# Patient Record
Sex: Male | Born: 1940 | Race: White | Hispanic: No | Marital: Married | Smoking: Former smoker
Health system: Southern US, Community
[De-identification: ages and names within clinical notes are randomized; demographics above are authoritative.]

## PROBLEM LIST (undated history)

## (undated) DIAGNOSIS — A419 Sepsis, unspecified organism: Secondary | ICD-10-CM

## (undated) DIAGNOSIS — E782 Mixed hyperlipidemia: Secondary | ICD-10-CM

## (undated) DIAGNOSIS — M722 Plantar fascial fibromatosis: Secondary | ICD-10-CM

## (undated) DIAGNOSIS — F329 Major depressive disorder, single episode, unspecified: Secondary | ICD-10-CM

## (undated) DIAGNOSIS — J189 Pneumonia, unspecified organism: Secondary | ICD-10-CM

## (undated) DIAGNOSIS — I749 Embolism and thrombosis of unspecified artery: Secondary | ICD-10-CM

## (undated) DIAGNOSIS — E46 Unspecified protein-calorie malnutrition: Secondary | ICD-10-CM

## (undated) DIAGNOSIS — M109 Gout, unspecified: Secondary | ICD-10-CM

## (undated) DIAGNOSIS — I10 Essential (primary) hypertension: Secondary | ICD-10-CM

## (undated) DIAGNOSIS — M1990 Unspecified osteoarthritis, unspecified site: Secondary | ICD-10-CM

## (undated) DIAGNOSIS — F32A Depression, unspecified: Secondary | ICD-10-CM

## (undated) DIAGNOSIS — N400 Enlarged prostate without lower urinary tract symptoms: Secondary | ICD-10-CM

## (undated) DIAGNOSIS — R739 Hyperglycemia, unspecified: Secondary | ICD-10-CM

## (undated) DIAGNOSIS — G20 Parkinson's disease: Secondary | ICD-10-CM

## (undated) DIAGNOSIS — E785 Hyperlipidemia, unspecified: Secondary | ICD-10-CM

## (undated) DIAGNOSIS — I2510 Atherosclerotic heart disease of native coronary artery without angina pectoris: Secondary | ICD-10-CM

## (undated) DIAGNOSIS — I671 Cerebral aneurysm, nonruptured: Secondary | ICD-10-CM

## (undated) DIAGNOSIS — L89159 Pressure ulcer of sacral region, unspecified stage: Secondary | ICD-10-CM

## (undated) DIAGNOSIS — I491 Atrial premature depolarization: Secondary | ICD-10-CM

## (undated) DIAGNOSIS — K921 Melena: Secondary | ICD-10-CM

## (undated) DIAGNOSIS — I609 Nontraumatic subarachnoid hemorrhage, unspecified: Secondary | ICD-10-CM

## (undated) DIAGNOSIS — J329 Chronic sinusitis, unspecified: Secondary | ICD-10-CM

## (undated) DIAGNOSIS — Z930 Tracheostomy status: Secondary | ICD-10-CM

## (undated) DIAGNOSIS — I639 Cerebral infarction, unspecified: Secondary | ICD-10-CM

## (undated) HISTORY — DX: Plantar fascial fibromatosis: M72.2

## (undated) HISTORY — DX: Cerebral infarction, unspecified: I63.9

## (undated) HISTORY — PX: OTHER SURGICAL HISTORY: SHX169

## (undated) HISTORY — DX: Melena: K92.1

## (undated) HISTORY — DX: Nontraumatic subarachnoid hemorrhage, unspecified: I60.9

## (undated) HISTORY — DX: Mixed hyperlipidemia: E78.2

## (undated) HISTORY — DX: Gout, unspecified: M10.9

## (undated) HISTORY — DX: Unspecified osteoarthritis, unspecified site: M19.90

## (undated) HISTORY — DX: Atrial premature depolarization: I49.1

## (undated) HISTORY — DX: Chronic sinusitis, unspecified: J32.9

## (undated) HISTORY — DX: Atherosclerotic heart disease of native coronary artery without angina pectoris: I25.10

## (undated) HISTORY — DX: Hyperglycemia, unspecified: R73.9

## (undated) HISTORY — DX: Essential (primary) hypertension: I10

## (undated) HISTORY — PX: APPENDECTOMY: SHX54

## (undated) HISTORY — DX: Embolism and thrombosis of unspecified artery: I74.9

## (undated) HISTORY — DX: Cerebral aneurysm, nonruptured: I67.1

---

## 1999-05-02 ENCOUNTER — Encounter: Payer: Self-pay | Admitting: Obstetrics and Gynecology

## 1999-05-03 ENCOUNTER — Inpatient Hospital Stay: Admission: RE | Admit: 1999-05-03 | Discharge: 1999-05-09 | Payer: Self-pay | Admitting: Vascular Surgery

## 1999-05-03 ENCOUNTER — Encounter (INDEPENDENT_AMBULATORY_CARE_PROVIDER_SITE_OTHER): Payer: Self-pay

## 1999-05-03 ENCOUNTER — Encounter: Payer: Self-pay | Admitting: Vascular Surgery

## 2001-05-20 ENCOUNTER — Ambulatory Visit (HOSPITAL_COMMUNITY): Admission: RE | Admit: 2001-05-20 | Discharge: 2001-05-20 | Payer: Self-pay | Admitting: Internal Medicine

## 2004-10-19 ENCOUNTER — Ambulatory Visit (HOSPITAL_COMMUNITY): Admission: RE | Admit: 2004-10-19 | Discharge: 2004-10-19 | Payer: Self-pay | Admitting: *Deleted

## 2004-10-19 ENCOUNTER — Ambulatory Visit (HOSPITAL_BASED_OUTPATIENT_CLINIC_OR_DEPARTMENT_OTHER): Admission: RE | Admit: 2004-10-19 | Discharge: 2004-10-19 | Payer: Self-pay | Admitting: *Deleted

## 2005-10-18 ENCOUNTER — Ambulatory Visit (HOSPITAL_BASED_OUTPATIENT_CLINIC_OR_DEPARTMENT_OTHER): Admission: RE | Admit: 2005-10-18 | Discharge: 2005-10-18 | Payer: Self-pay | Admitting: *Deleted

## 2006-07-22 ENCOUNTER — Ambulatory Visit: Payer: Self-pay | Admitting: Cardiology

## 2006-07-25 ENCOUNTER — Ambulatory Visit: Payer: Self-pay | Admitting: Cardiology

## 2006-07-30 ENCOUNTER — Ambulatory Visit: Payer: Self-pay | Admitting: Cardiology

## 2006-08-16 ENCOUNTER — Ambulatory Visit: Payer: Self-pay | Admitting: Cardiology

## 2006-08-24 ENCOUNTER — Ambulatory Visit: Payer: Self-pay | Admitting: Cardiology

## 2006-09-18 ENCOUNTER — Ambulatory Visit: Payer: Self-pay | Admitting: Cardiology

## 2006-12-17 ENCOUNTER — Ambulatory Visit: Payer: Self-pay | Admitting: Cardiology

## 2007-02-18 ENCOUNTER — Ambulatory Visit: Payer: Self-pay | Admitting: Cardiology

## 2007-02-20 ENCOUNTER — Inpatient Hospital Stay (HOSPITAL_BASED_OUTPATIENT_CLINIC_OR_DEPARTMENT_OTHER): Admission: RE | Admit: 2007-02-20 | Discharge: 2007-02-20 | Payer: Self-pay | Admitting: Cardiology

## 2007-02-20 ENCOUNTER — Ambulatory Visit: Payer: Self-pay | Admitting: Cardiology

## 2007-02-27 ENCOUNTER — Ambulatory Visit: Payer: Self-pay | Admitting: Cardiology

## 2007-10-01 ENCOUNTER — Ambulatory Visit: Payer: Self-pay | Admitting: Cardiology

## 2008-08-20 ENCOUNTER — Ambulatory Visit: Payer: Self-pay | Admitting: Cardiology

## 2009-03-25 DIAGNOSIS — I2510 Atherosclerotic heart disease of native coronary artery without angina pectoris: Secondary | ICD-10-CM

## 2009-03-25 DIAGNOSIS — R002 Palpitations: Secondary | ICD-10-CM

## 2009-03-25 DIAGNOSIS — I10 Essential (primary) hypertension: Secondary | ICD-10-CM

## 2009-03-25 DIAGNOSIS — E785 Hyperlipidemia, unspecified: Secondary | ICD-10-CM

## 2010-11-29 NOTE — Assessment & Plan Note (Signed)
[facility]                          [facility]   Richard Mcpherson, Richard Mcpherson                       MRN:          [id number]  DATE:08/20/2008                            DOB:          [birth year]    REFERRING PHYSICIAN:  Kirstie Peri, MD   The patient is a 70 year old male with history of nonobstructive  coronary artery disease by catheterization in August 2008.  He has also  history of palpitations.  He states that he is doing better from that  perspective.  He has no shortness of breath, orthopnea, PND.  He has no  more palpitations.  Next, he is essentially asymptomatic from  cardiovascular standpoint.   MEDICATIONS:  1. Aspirin 81 mg a day.  2. Tenoretic 50 mg p.o. daily.  3. Vytorin 10/80 mg p.o. nightly.   PHYSICAL EXAMINATION:  VITAL SIGNS:  Blood pressure is 123/73, heart  rate 65, weight 227.  HEENT:  Pupils, eyes clear; conjunctivae clear.  NECK:  Supple.  Normal carotid upstroke.  No carotid bruits.  LUNGS:  Clear breath sounds bilaterally.  HEART:  Regular rate and rhythm with normal S1, S2.  No murmurs, rubs,  or gallops.  ABDOMEN:  Soft, nontender.  No rebound or guarding.  Good bowel sounds.  EXTREMITY:  No cyanosis, clubbing, or edema.   PROBLEM LIST:  1. Nonobstructive coronary artery disease.  2. Good left ventricular function.  3. Palpitations quiescent on beta-blocker therapy.  4. Hypertension, controlled.  5. Dyslipidemia.  6. History of right lower extremity distal embolism status post      embolectomy 2000.  7. History of hyperglycemia.   PLAN:  1. The patient is doing well.  Dr. Sherryll Burger is following his cholesterol.  2. His blood pressure is under good control.  3. The patient with no cardiovascular symptoms and does not need any      stress testing at this point in time.     Learta Codding, MD,FACC  Electronically Signed    GED/MedQ  DD: 08/20/2008  DT: 08/21/2008  Job #: [id number]   cc:   Kirstie Peri,  MD

## 2010-11-29 NOTE — Assessment & Plan Note (Signed)
[facility]                          [facility]   ELIZEO, RODRIQUES                       MRN:          [id number]  DATE:02/27/2007                            DOB:          05-06-1941    CARDIOLOGIST:  Dr. Lewayne Bunting.   PRIMARY CARE PHYSICIAN:  Kirstie Peri, M.D.   HISTORY OF PRESENT ILLNESS:  Mr. Richard Mcpherson is a 70 year old male patient  who had recently been seen in the office with palpitations.  Since being  seen for palpitations, he presented to [facility] with complaints  of chest pain.  He had been evaluated several times in the past with  stress perfusion studies and Dr. Andee Lineman decided he should go for cardiac  catheterization to further define this anatomy.  That was performed on  February 20, 2007 by Dr. Riley Kill.  This revealed nonobstructive coronary  disease.  Specifically, he had a 30-40% mid LAD stenosis and a 60-70%  stenosis in the mid to distal vessel that looked to be intramyocardial.  His proximal diagonal had a 20-30% mid lesion and the marginal branch  had a 30-40% proximal to mid lesion.  The RCA was normal.  EF appeared  to be well-preserved.  He returns today for followup.   He denies any recurrent chest pain or shortness of breath, syncope or  near-syncope.  He does have occasional dyspepsia.  This is controlled  with over-the-counter antacids.   CURRENT MEDICATIONS:  1. Aspirin 81 mg daily.  2. Atenolol/chlorthalidone 50/25 mg daily.  3. Isosorbide mononitrate 30 mg daily.  4. Pravachol 40 mg 2 tablets nightly.  5. Multivitamin daily.   ALLERGIES:  CODEINE.   PHYSICAL EXAM:  He is well-developed, well-nourished in no acute  distress.  Blood pressure 132/72, pulse 59.  HEENT:  Normal.  NECK:  Without JVD.  CARDIAC:  S1, S2.  Regular rate and rhythm without murmurs.  LUNGS:  Clear to auscultation bilaterally without wheeze, rales, or  rhonchi.  ABDOMEN:  Soft and nontender with normoactive bowel  sounds.  No  organomegaly.  EXTREMITIES:  Without edema.  Calves soft and nontender.  SKIN:  Warm and dry.  NEUROLOGIC:  He is alert and oriented x3.  Cranial nerves 2-12 are  grossly intact.  Right femoral arteriotomy site without hematoma or bruit.   IMPRESSION:  1. Nonobstructive coronary artery disease as outlined above.  2. Good left ventricular function.  3. History of palpitations evaluated in the past by CardioNet event      monitoring.  4. Hypertension, treated.  5. Treated dyslipidemia.  6. History of hyperglycemia in the past.  7. History of acute right lower extremity distal embolus status post      embolectomy in 2000.  8. Past history of gastroesophageal reflux disease.   PLAN:  The patient presents to the office today for followup post  catheterization.  He is doing well.  As noted above, he has  nonobstructive coronary artery disease.  He needs aggressive risk factor  modification.  He last had his lipids checked in mid to late July, and  we adjusted  his Pravachol.  I had a long discussion with him today  regarding Vytorin versus other Statins.  He may require this in the  future if his LDL does not get down to 70 or lower.  He will continue on  aspirin, atenolol, or isosorbide.  He will follow up with his primary  care physician for his hyperglycemia and see [place] back in 6 months.      [name], PA-C  Electronically Signed      Jonelle Sidle, MD  Electronically Signed   SW/MedQ  DD: 02/27/2007  DT: 02/28/2007  Job #: [id number]   cc:   Kirstie Peri, MD

## 2010-11-29 NOTE — Cardiovascular Report (Signed)
NAME:  Richard Mcpherson, Richard Mcpherson NO.:  [id number]   MEDICAL RECORD NO.:  [id number]          PATIENT TYPE:  OIB   LOCATION:  1962                         FACILITY:  [facility]   PHYSICIAN:  Arturo Morton. Riley Kill, MD, FACCDATE OF BIRTH:  1940/12/29   DATE OF PROCEDURE:  02/20/2007  DATE OF DISCHARGE:                            CARDIAC CATHETERIZATION   INDICATIONS:  Mr. Dever is a 70 year old gentleman followed by Dr.  Andee Lineman.  He has had several admissions for some chest discomfort.  He  has had a low-risk radionuclide imaging study.  He has a strong family  history.  He has had prior embolus removed from the right lower  extremity for which he was treated with Coumadin for 6 months.  He  stopped smoking at that time.  Current study is done to assess coronary  anatomy.   PROCEDURE:  1. Left heart catheterization  2. Selective coronary arteriography.  3..  Hand injection into the left ventricle.   DESCRIPTION OF PROCEDURE:  The patient was brought to the  catheterization laboratory, prepped and draped in usual fashion.  A  repeat creatinine the morning of the study was 0.8.  It had been  slightly higher at 1.5 earlier.  Of note is also a mild eosinophilia.  The procedure was performed from the right femoral artery using 4-French  catheters.  We limited the LV injection to a hand injection to reduce  contrast load.  Views of the left and right coronary arteries were  obtained.  All catheters were subsequently removed.  He was taken to the  holding area in satisfactory clinical condition.  I then discussed the  case with Dr. Andee Lineman and reviewed the films with the patient's spouse.  There were no major complications.   HEMODYNAMIC DATA.:  1. Central aortic pressure 113/62.  2. LV pressure 110/13.  3. No gradient at pullback across the aortic valve.   ANGIOGRAPHIC DATA.:  1. Ventriculography was done by hand injection only.  Overall systolic      function appeared to be  reasonably good, although an accurate      assessment cannot be completely obtained with just a hand injection      alone.  2. The left main is free of critical disease.  3. The LAD courses to the apex.  After the diagonal takeoff there is a      focal 30-40% area of narrowing.  In the mid distal vessel is a long      area of segmental plaque that looks like it may be intramyocardial.      There is probably about 60 to up to 70% narrowing in the mid to      distal vessel, although it does appear to be likely adequate for      flow.  The vessel then goes down over the apical tip.  4. There is a proximal diagonal or intermediate vessel that has about      20-30% narrowing in its midportion but is otherwise without      critical disease.  5. The circumflex is a dominant vessel.  There is a major marginal      branch that has eccentric 30-40% proximal mid plaquing.  The      remainder of the vessel is without critical narrowing.  The AV      circumflex courses posteriorly into a posterolateral system and      posterior descending artery.  These appear free of critical      disease.  6. The right coronary is nondominant vessel without critical      narrowing.   CONCLUSIONS:  1. Preserved overall left ventricular function by hand injection.  2. 30-40% mid-LAD focal stenosis and long segmental 60-70% area of      disease in an intramyocardial segment distally.  3. Scattered lesions as noted above.  4. Mild increase in creatinine on diuretic therapy, now improved.  5. Mild eosinophilia by CBC testing.   PLAN:  1. Follow-up with Dr. Andee Lineman for adjustment of blood pressure      medicines.  2. Continued follow-up with Dr. Sherryll Burger.  3. Aggressive risk factor reduction.      Arturo Morton. Riley Kill, MD, [facility]  Electronically Signed     TDS/MEDQ  D:  02/20/2007  T:  02/20/2007  Job:  [id number]   cc:   Learta Codding, MD,FACC  Kirstie Peri, MD

## 2010-11-29 NOTE — Assessment & Plan Note (Signed)
[facility]                          [facility]   Richard Mcpherson, Richard Mcpherson                       MRN:          [id number]  DATE:10/01/2007                            DOB:          Mar 16, 1941    CARDIOLOGIST:  Dr. Lewayne Bunting.   PRIMARY CARE PHYSICIAN:  Dr. Kirstie Peri.   REASON FOR VISIT:  A 58-month followup.   HISTORY OF PRESENT ILLNESS:  Richard Mcpherson is a 70 year old male patient  with a history of nonobstructive coronary disease by catheterization in  August 2008 who returns to the office today for followup.  Overall he is  doing well.  Denies any chest pain, shortness breath.  Denies any  orthopnea, PND, or pedal edema.  Denies any palpitations or syncope.  The patient does tell me he has had a rash on his face for the last  several months.  He was concerned that it was the  atenolol/chlorthalidone.  He stopped this over the last 10 days, and his  rash seems to be getting better.   CURRENT MEDICATIONS:  1. Aspirin 81 mg daily.  2. Vytorin 10/40 mg nightly.   ALLERGIES:  CODEINE.   PHYSICAL EXAM:  He is well-nourished male in no acute distress.  Blood pressure is 140/70, pulse 62, weight 228.2 pounds.  HEENT is normal.  Neck without appreciable JVD.  CARDIAC:  Normal S1, S2.  Regular rate and rhythm without murmur.  LUNGS:  Clear to auscultation bilaterally.  ABDOMEN:  Soft, nontender.  EXTREMITIES:  No edema.  NEUROLOGIC:  He is alert and oriented x3.  Cranial II-XII grossly  intact.  SKIN:  He does have a diffuse erythematous rash about his face,  especially in the beard area.   Electrocardiogram reveals sinus rhythm with heart rate of 62 normal  axis, no acute changes.     Database: Labs obtained June 2009 revealing a creatinine of 0.9,  triglycerides 236, total cholesterol 193, HDL 27, LDL 119.   IMPRESSION:  1. Rash.      a.     Question secondary to thiazide diuretic  2. Nonobstructive coronary disease.  3.  Good LV function.  4. History of palpitations.      a.     Quiescent on beta-blocker therapy.      b.     Documented accelerated idioventricular rhythm by CardioNet       monitoring in 2008.  5. hypertension.  6. Dyslipidemia.  7. History of acute right lower extremity distal embolus, status post      embolectomy in 2000.  8. History of hyperglycemia.   PLAN:  1. Discontinue the atenolol/chlorthalidone.  Question whether or not      he may have rash coming from the thiazide component.  2. Initiate atenolol 50 mg daily.  He notes that he has had      symptomatic improvement with the atenolol.  He has had much less      palpitations.  He is to start the atenolol once his rash has      subsided or almost subsided.  3. He will  need followup lipids and LFTs some time in the next several      weeks.  If his LDL remains above 70, we will increase his Vytorin      to 10/80 mg daily.  4. We will see him back in 6 months or sooner p.r.n.      [name], PA-C  Electronically Signed      Learta Codding, MD,FACC  Electronically Signed   SW/MedQ  DD: 10/01/2007  DT: 10/01/2007  Job #: [id number]   cc:   Kirstie Peri, MD

## 2010-12-02 NOTE — Op Note (Signed)
NAME:  Richard Mcpherson, Richard Mcpherson NO.:  [id number]   MEDICAL RECORD NO.:  [id number]          PATIENT TYPE:  AMB   LOCATION:  [facility]SC                          FACILITY:  [facility]   PHYSICIAN:  Tennis Must Meyerdierks, M.D.DATE OF BIRTH:  [birth year]   DATE OF PROCEDURE:  10/19/2004  DATE OF DISCHARGE:                                 OPERATIVE REPORT   PREOPERATIVE DIAGNOSES:  Dupuytren's contracture right middle, ring, and  small fingers.   POSTOPERATIVE DIAGNOSES:  Dupuytren's contracture right middle, ring, and  small fingers.   PROCEDURE:  Excision of Dupuytren's cords with release of contracture right  middle, ring and small fingers.   SURGEON:  Lowell Bouton, M.D.   ANESTHESIA:  General.   OPERATIVE FINDINGS:  The patient had longitudinal cords from the mid palm  out to the DIP joint on the ring and small fingers with a cord mainly  involving the MP joint on the long finger. The radial digital nerve and  artery wrapped around the cord in both the ring and small fingers.   DESCRIPTION OF PROCEDURE:  Under general anesthesia with a tourniquet on the  right arm, the right hand was prepped and draped in usual fashion. After  exsanguinating the limb, the tourniquet was inflated to 250 mmHg. A  transverse incision was made in the distal palmar crease from the web  between the second and third fingers across to the ulnar side of the hand.  Sharp dissection was carried through the Dupuytren's' cords partially  releasing the digit. Blunt dissection was then carried around the cords in  the palm to identify the neurovascular bundles on either side. Incision was  then extended on the small finger first in a Brunner zigzag fashion out to  the DIP joint. Sharp dissection was carried through the subcutaneous tissues  and 4-0 silk retention sutures were inserted for retraction. The dissection  was carried from proximal to distal carefully dissecting out the radial and  ulnar digital nerves of the small finger. The contracture was then  completely excised from proximal to distal and was found to go around the  radial digital nerve at the proximal digital crease. After carefully  dissecting it out, the neurovascular bundles were left intact. The finger  came out to neutral position after excising all of the Dupuytren's' cord. A  zigzag incision was then made in line with the ring finger just beyond the  DIP crease. Flaps were elevated sharply and 4-0 silk retention sutures were  inserted. Blunt dissection was carried from proximal to distal along the  Dupuytren's' cord and a retrovascular cord was identified radially. It was  traced all the way to the DIP joint and the neurovascular bundles were  protected. After completely excising the cord on the ring finger, the finger  was completely straight. On the long finger, blunt dissection was carried in  the palm out to the proximal digital crease and the cord was released from  the MP joint. Care was taken to protect the ulnar neurovascular bundle. The  MP joint went out to full extension. Following excision  of this cord. The  wounds were then irrigated copiously with saline and vessel loop drains were  inserted in the ring and small fingers. The skin was closed with 4-0 nylon  sutures along each finger. The palm was left open using the __________ open  palm technique. Sterile dressings were applied. Marcaine 0.5%  was  inserted into the skin for pain control and digital blocks were performed.  Sterile dressings were applied and a plaster splint was placed with the  fingers in full extension and the wrist in extension. The tourniquet was  released with good circulation of the hand. The patient went to recovery  room awake and stable condition.      EMM/MEDQ  D:  10/19/2004  T:  10/19/2004  Job:  [id number]   cc:   Dr. Lorin Picket __________

## 2010-12-02 NOTE — Assessment & Plan Note (Signed)
[facility]                          [facility]   JOSEL, KEO                       MRN:          [id number]  DATE:08/16/2006                            DOB:          05-22-1941    REASON FOR VISIT:  Post hospital followup.   Mr. Doane is a very pleasant 70 year old male, whom we have seen on 2  separate occasions in the last few weeks here at [facility], in  consultation for evaluation of chest pain.  The patient ruled out for  myocardial infarction with negative serial cardiac markers and had had  an adequate exercise stress Cardiolite prior to our initial evaluation,  which we felt to be a low risk study.  Left ventricular function  was  normal.   Of note, the patient does have several cardiac risk factors, and also  had documented history of acute distal embolus to the right lower  extremity requiring embolectomy by Dr. Cari Caraway in 2000.  He was  treated with Coumadin for 6 months.   The patient also presented with dyslipidemia with a significantly low  HDL.  We recommended the addition of Niaspan for this, and the patient  has been tolerating this medication.   The patient also presented to [place] with a longstanding history of  palpitations.  We recommended a followup outpatient cardiac monitor and  the patient has one remaining week.  He continues to have a sensation of  skipped beats, but no fluttering, as he had reported to [place] during his  hospitalization.  Of note, TSH level was normal.   Review of rhythm strips to date do reveal a run of AIVR to approximately  100 BPM, with duration of 14 beats.   MEDICATIONS:  1. Aspirin 81 daily.  2. Niaspan 500 daily.   PHYSICAL EXAMINATION:  Blood pressure 152/90, pulse 84, regular, weight  224.  GENERAL:  A 70 year old male sitting upright in no distress.  HEENT:  Normocephalic and atraumatic.  NECK:  Palpable carotid pulses, without bruits.  LUNGS:  Clear  to auscultation in all fields.  HEART:  Regular rate and rhythm (S1, S2).  No significant murmurs.  ABDOMEN:  Soft, nontender.  EXTREMITIES:  Intact pulses, without edema.  NEURO:  Nonfocal, no deficit.   IMPRESSION:  1. Longstanding palpitations.      a.     Asymptomatic.      b.     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      c.     Preserved left ventricular function  by a recent stress       test.  2. Atypical chest pain.      a.     Low risk, adequate exercise stress Cardiolite; EF 66%       January 2008.  3. Dyslipidemia/low HDL.      a.     Niaspan recently added.  4. Hypertension.  5. Status post right lower extremity embolectomy.      a.     Secondary to acute distal embolus in 2000, per Dr. Thayer Ohm  Edilia Bo.      b.     Treated with Coumadin for 6 months.  6. GERD.   PLAN:  1. Schedule 2D echocardiogram for reassessment of left ventricular      function and exclusion of underlying structural abnormalities.  2. Continue medical therapy for the asymptomatic palpitations.  We      will, however, add a low dose beta blocker, in conjunction with a      diuretic, for treatment of both the documented PVCs as well as the      hypertension.  3. Return to clinic, follow with myself and Dr. Andee Lineman in one month      for review of final CardioNet results, as well as followup of the      2D echocardiogram study.  4. The patient will need followup fasting lipids/liver profile in      approximately 2 months.      Gene Serpe, PA-C  Electronically Signed      Learta Codding, MD,FACC  Electronically Signed   GS/MedQ  DD: 08/16/2006  DT: 08/16/2006  Job #: [id number]   cc:   Kirstie Peri, MD

## 2010-12-02 NOTE — Assessment & Plan Note (Signed)
[facility]                          [facility]   Richard Mcpherson, Richard Mcpherson                       MRN:          [id number]  DATE:11/16/2006                            DOB:          1940/10/05    CARDIOLOGIST:  Dr. Andee Lineman.   PRIMARY CARE PHYSICIAN:  Dr. Sherryll Burger.   HISTORY OF PRESENT ILLNESS:  Richard Mcpherson is a 70 year old male patient  with history of hypertension and palpitations who returns to the office  today for followup.  He saw Richard Serpe, PA-C last on September 18, 2006.  At  that time he had recommended that the patient decrease his  atenolol/chlorthalidone to three times a week to help with side effects.  The patient decided to keep taking it every day, and his fatigue and  sluggishness have abated.  He notes that his palpitations are quite  minimal now.  He notices them every once in awhile, and they only last a  few seconds.  He denies syncope or near syncope.  Denies any chest pain,  shortness of breath.  Denies any nausea, diaphoresis.   CURRENT MEDICATIONS:  1. Aspirin 81 mg daily.  2. Atenolol/chlorthalidone 50/25 mg daily.  3. He recently stopped Niaspan because he was developing rash.   ALLERGIES:  CODEINE.   PHYSICAL EXAMINATION:  GENERAL:  He is a well-nourished, well-developed  male.  VITAL SIGNS:  Blood pressure 125/81, pulse 57, weight 216.4 pounds.  HEENT:  Normal.  NECK:  Without JVD.  Carotids are without bruits bilaterally.  CARDIAC:  S1, S2, regular rate and rhythm without murmur.  LUNGS:  Clear to auscultation bilaterally without wheezing, rhonchi or  rales.  ABDOMEN:  Soft, nontender with normoactive bowel sounds.  No  organomegaly.  EXTREMITIES:  Without edema.  Calves soft, nontender.  SKIN:  Warm and dry.  NEUROLOGIC:  He is alert and oriented x3.  Cranial nerves II-XII are  grossly intact.   IMPRESSION:  1. Palpitations.      a.     Improved on beta blocker.      b.     Documented accelerated  idioventricular rhythm by recent       CardioNet monitoring.  2. Normal LV function.  3. Hypertension.  4. History of low risk adequate exercise Cardiolite January 2008.  5. Mixed dyslipidemia.      a.     Recent lipid panel October 24, 2006:  Triglycerides 292, total       cholesterol 255, HDL 23, LDL 174.  6. Hyperglycemia.      a.     Recent metabolic panel October 24, 2006 with glucose 106.  7. History of acute right lower extremity distal embolus status post      embolectomy in 2000.   PLAN:  The patient returns to the office today for followup.  He was  able to continue on his beta blocker on a daily basis, and this has  improved his symptoms.  He is not bothered by his palpitations all that  significantly.  Will continue his current therapy as listed above.  He  does,  however, have significant dyslipidemia.  I think given his risk  factors and probable glucose intolerance, we should proceed with statin  therapy.  He is now off Niaspan.  He had had some side effects to  Lipitor in the past.  He stopped Vytorin secondary to reports in the  media recently.  I have recommended we place him on Pravachol 40 mg  q.h.s., and he is in agreement with this.  Will check lipids and LFTs in  about 6-8 weeks.  I asked him to follow up with Dr. Sherryll Burger about his  mildly elevated blood glucose and also recommended that he refer to the  American Heart Association web site and/or the [employer] for help  with his diet.  He will continue with walking for exercise.  Will have  him follow up routinely here in about 6-9 months with Dr. Andee Lineman.      [name], PA-C  Electronically Signed      Learta Codding, MD,FACC  Electronically Signed   SW/MedQ  DD: 12/17/2006  DT: 12/17/2006  Job #: [id number]   cc:   Richard Peri, MD

## 2010-12-02 NOTE — Op Note (Signed)
NAME:  Richard Mcpherson, Richard Mcpherson NO.:  [id number]   MEDICAL RECORD NO.:  [id number]          PATIENT TYPE:  AMB   LOCATION:  DSC                          FACILITY:  [facility]   PHYSICIAN:  Lowell Bouton, M.D.DATE OF BIRTH:  07/17/1941   DATE OF PROCEDURE:  10/18/2005  DATE OF DISCHARGE:                                 OPERATIVE REPORT   PREOP DIAGNOSIS:  Dupuytren's contracture, left long and ring fingers.   POSTOP DIAGNOSIS:  Dupuytren's contracture, left long and ring fingers.   PROCEDURE:  Excision of Dupuytren's contracture, left long and ring fingers.   SURGEON:  Lowell Bouton, M.D.   ANESTHESIA:  General.   OPERATIVE FINDINGS:  The patient had a large central cord on the long finger  that extended from the proximal palm out to beyond the PIP joint.  Both the  PIP and MP were involved.  The ring finger had a smaller cord that mainly  involved the MP joint.   PROCEDURE:  Under general anesthesia with a tourniquet on the left arm, the  left hand was prepped and draped in usual fashion and after exsanguinating  the limb, the tourniquet was inflated to 250 mmHg.  A transverse incision  was made in the palm over the large cord in line with the long finger.  The  cord was then divided.  This area was intended to be left opened using the  McCash technique.  A Brunner incision was made then from the transverse  incision proximally and distally out to the DIP level on the long finger.  The proximal end was done first and a large cord was removed from the  proximal palm.  The dissection was then carried distally and the  neurovascular bundles were identified in the palm.  They were traced out to  the proximal crease and the cord was elevated from the flexor sheath  sharply.  The flaps were then elevated and 4-0 silk retention sutures were  inserted.  Blunt dissection was carried distally out at the middle phalanx,  identifying the neurovascular bundle  both radially and ulnarly.  These  bundles were then traced distal to proximal, excising any Dupuytren's cords  as it went.  There were oblique cords out at the level of the middle phalanx  that were both excised.  The central cord was then completely excised after  protecting the neurovascular bundles both radially and ulnarly.  The finger  came out to full extension.  The specimen was not sent to the lab.  A zigzag  incision was then made in the ring finger over the proximal digital crease  and carried down sharply through the subcutaneous tissues.  There was a  dimple there that was excised and then a Dupuytren's cord that measured  about an inch in length.  This was completely excised, taking care to  protect the neurovascular bundles.  The fingers were then completely  straight.  The wounds were irrigated copiously with saline.  The skin was  closed with 4-0 nylon sutures over a vessel loop drain leaving the palm  open.  Sterile dressings  were then applied followed by a volar splint.  Tourniquet was released with  good circulation of the hand.  Half percent Marcaine was inserted for pain  control.  He went to the recovery room awake and stable condition.  Volar  splint was applied.      Lowell Bouton, M.D.  Electronically Signed     EMM/MEDQ  D:  10/18/2005  T:  10/19/2005  Job:  [id number]

## 2010-12-02 NOTE — Assessment & Plan Note (Signed)
[facility]                          [facility] OFFICE NOTE   Richard Mcpherson, Richard Mcpherson                       MRN:          [id number]  DATE:09/18/2006                            DOB:          1941-03-16    PRIMARY CARDIOLOGIST:  Learta Codding, MD,FACC   REASON FOR VISIT:  Schedule clinic followup. Please refer to my clinic  note of January 31 for full details.   Since our last visit, the patient has had a 2D echocardiogram revealing  normal left ventricular function with only very mild LVH.   He has also completed evaluation with the CardioNet monitor which  yielded a run of nonsustained VT with otherwise NSR and frequent PACs.   We reviewed these findings at time of our last clinic visit and  recommended adding a low dose beta-blocker, in conjunction with the  diuretic, for treatment of the documented PVCs as well as hypertension.   Since then, the patient reports a marked decrease in palpitations, but  also feels a bit more fatigued and lethargic since the addition of this  medication.   CURRENT MEDICATIONS:  1. Aspirin 81 daily.  2. Atenolol/chlorthalidone 50/25 daily.  3. Niaspan 500 daily.   PHYSICAL EXAMINATION:  Blood pressure 126/76, pulse 58 and regular,  weight 224.  GENERAL: A 70 year old male, sitting upright in no distress.  HEENT: Normocephalic, atraumatic.  NECK: Palpable carotid pulses.  LUNGS:  Clear to auscultation in all fields.  HEART: Regular rate and rhythm (S1, S2). No significant murmurs.  ABDOMEN: Soft, nontender.  EXTREMITIES: No edema.  NEURO: No focal deficit.   IMPRESSION:  1. Longstanding history of palpitations.      a.     Marked improvement with recent addition of beta-blocker.      b.     Documented AIVR by recent CardioNet monitoring.  2. Normal left ventricular function.  3. Hypertension.      a.     Much improved with recent addition of diuretic.  4. History of atypical chest pain.      a.      Low risk, adequate exercise Cardiolite in January 2008.  5. Dyslipidemia/low HDL.      a.     Recent addition of Niaspan.  6. History of acute right lower extremity distal embolus.      a.     Status post embolectomy in 2000: Treated with Coumadin for       six months.   PLAN:  1. We agreed to down titrate the atenolol/chlorthalidone to three      times a week to try to minimize the impact on blood pressure as      well as heart rate and see if this improves his symptoms of feeling      fatigued and sluggish.  2. Check a fasting lipid/liver profile in one month, following recent      addition of Niaspan for treatment of low HDL.  3. Return clinic followup with myself and Dr. Andee Lineman in three months.      Gene Serpe, PA-C  Electronically Signed  Learta Codding, MD,FACC  Electronically Signed   GS/MedQ  DD: 09/18/2006  DT: 09/18/2006  Job #: [id number]   cc:   Kirstie Peri, MD

## 2010-12-02 NOTE — Assessment & Plan Note (Signed)
[facility]                                 ON-CALL NOTE   Richard Mcpherson, Richard Mcpherson                       MRN:          [id number]  DATE:08/10/2006                            DOB:          Nov 28, 1940    I got a call from the CardioNet monitoring services tonight regarding  [name]. Apparently, he is a patient of Dr. Andee Lineman. He has been  wearing a monitor for, what they have listed as, unspecified conduction  disorders. Tonight, he had a 12 beat run of asymptomatic non-sustained  ventricular tachycardia. As above, this was asymptomatic without any pre-  syncope, syncope, or chest pain. I have asked them to kindly fax the  strips to Dr. Margarita Mail office in the morning for further followup.     Bevelyn Buckles. Bensimhon, MD  Electronically Signed    DRB/MedQ  DD: 08/10/2006  DT: 08/10/2006  Job #: [id number]

## 2012-01-10 ENCOUNTER — Encounter: Payer: Self-pay | Admitting: Cardiology

## 2014-02-04 ENCOUNTER — Encounter (HOSPITAL_COMMUNITY): Payer: Self-pay | Admitting: Pharmacy Technician

## 2014-02-16 ENCOUNTER — Encounter (HOSPITAL_COMMUNITY): Payer: Self-pay

## 2014-02-16 ENCOUNTER — Encounter (HOSPITAL_COMMUNITY)
Admission: RE | Admit: 2014-02-16 | Discharge: 2014-02-16 | Disposition: A | Discharge status: Home / Self Care | Payer: Medicare Other | Source: Ambulatory Visit | Attending: Ophthalmology | Admitting: Ophthalmology

## 2014-02-16 DIAGNOSIS — Z87891 Personal history of nicotine dependence: Secondary | ICD-10-CM | POA: Diagnosis not present

## 2014-02-16 DIAGNOSIS — I2510 Atherosclerotic heart disease of native coronary artery without angina pectoris: Secondary | ICD-10-CM | POA: Diagnosis not present

## 2014-02-16 DIAGNOSIS — I10 Essential (primary) hypertension: Secondary | ICD-10-CM | POA: Diagnosis not present

## 2014-02-16 DIAGNOSIS — H2510 Age-related nuclear cataract, unspecified eye: Secondary | ICD-10-CM | POA: Diagnosis not present

## 2014-02-16 NOTE — Patient Instructions (Addendum)
Your procedure is scheduled on:  02/19/2014  Report to [facility] at  6:30    AM.  Call this number if you have problems the morning of surgery: [phone]   Remember:   Do not eat or drink :After Midnight.    Take these medicines the morning of surgery with A SIP OF WATER: Tenorectic   Do not wear jewelry, make-up or nail polish.  Do not wear lotions, powders, or perfumes. You may wear deodorant.  Do not shave 48 hours prior to surgery.  Do not bring valuables to the hospital.  Contacts, dentures or bridgework may not be worn into surgery.  Patients discharged the day of surgery will not be allowed to drive home.  Name and phone number of your driver:    Please read over the following fact sheets that you were given: Pain Booklet, Surgical Site Infection Prevention, Anesthesia Post-op Instructions and Care and Recovery After Surgery  Cataract Surgery  A cataract is a clouding of the lens of the eye. When a lens becomes cloudy, vision is reduced based on the degree and nature of the clouding. Surgery may be needed to improve vision. Surgery removes the cloudy lens and usually replaces it with a substitute lens (intraocular lens, IOL). LET YOUR EYE DOCTOR KNOW ABOUT:  Allergies to food or medicine.   Medicines taken including herbs, eyedrops, over-the-counter medicines, and creams.   Use of steroids (by mouth or creams).   Previous problems with anesthetics or numbing medicine.   History of bleeding problems or blood clots.   Previous surgery.   Other health problems, including diabetes and kidney problems.   Possibility of pregnancy, if this applies.  RISKS AND COMPLICATIONS  Infection.   Inflammation of the eyeball (endophthalmitis) that can spread to both eyes (sympathetic ophthalmia).   Poor wound healing.   If an IOL is inserted, it can later fall out of proper position. This is very uncommon.   Clouding of the part of your eye that holds an IOL in place. This is  called an "after-cataract." These are uncommon, but easily treated.  BEFORE THE PROCEDURE  Do not eat or drink anything except small amounts of water for 8 to 12 before your surgery, or as directed by your caregiver.   Unless you are told otherwise, continue any eyedrops you have been prescribed.   Talk to your primary caregiver about all other medicines that you take (both prescription and non-prescription). In some cases, you may need to stop or change medicines near the time of your surgery. This is most important if you are taking blood-thinning medicine.Do not stop medicines unless you are told to do so.   Arrange for someone to drive you to and from the procedure.   Do not put contact lenses in either eye on the day of your surgery.  PROCEDURE There is more than one method for safely removing a cataract. Your doctor can explain the differences and help determine which is best for you. Phacoemulsification surgery is the most common form of cataract surgery.  An injection is given behind the eye or eyedrops are given to make this a painless procedure.   A small cut (incision) is made on the edge of the clear, dome-shaped surface that covers the front of the eye (cornea).   A tiny probe is painlessly inserted into the eye. This device gives off ultrasound waves that soften and break up the cloudy center of the lens. This makes it easier for  the cloudy lens to be removed by suction.   An IOL may be implanted.   The normal lens of the eye is covered by a clear capsule. Part of that capsule is intentionally left in the eye to support the IOL.   Your surgeon may or may not use stitches to close the incision.  There are other forms of cataract surgery that require a larger incision and stiches to close the eye. This approach is taken in cases where the doctor feels that the cataract cannot be easily removed using phacoemulsification. AFTER THE PROCEDURE  When an IOL is implanted, it  does not need care. It becomes a permanent part of your eye and cannot be seen or felt.   Your doctor will schedule follow-up exams to check on your progress.   Review your other medicines with your doctor to see which can be resumed after surgery.   Use eyedrops or take medicine as prescribed by your doctor.  Document Released: 06/22/2011 Document Reviewed: 06/19/2011 [facility] Patient Information 2012 [place].  .Cataract Surgery Care After Refer to this sheet in the next few weeks. These instructions provide you with information on caring for yourself after your procedure. Your caregiver may also give you more specific instructions. Your treatment has been planned according to current medical practices, but problems sometimes occur. Call your caregiver if you have any problems or questions after your procedure.  HOME CARE INSTRUCTIONS   Avoid strenuous activities as directed by your caregiver.   Ask your caregiver when you can resume driving.   Use eyedrops or other medicines to help healing and control pressure inside your eye as directed by your caregiver.   Only take over-the-counter or prescription medicines for pain, discomfort, or fever as directed by your caregiver.   Do not to touch or rub your eyes.   You may be instructed to use a protective shield during the first few days and nights after surgery. If not, wear sunglasses to protect your eyes. This is to protect the eye from pressure or from being accidentally bumped.   Keep the area around your eye clean and dry. Avoid swimming or allowing water to hit you directly in the face while showering. Keep soap and shampoo out of your eyes.   Do not bend or lift heavy objects. Bending increases pressure in the eye. You can walk, climb stairs, and do light household chores.   Do not put a contact lens into the eye that had surgery until your caregiver says it is okay to do so.   Ask your doctor when you can return to  work. This will depend on the kind of work that you do. If you work in a dusty environment, you may be advised to wear protective eyewear for a period of time.   Ask your caregiver when it will be safe to engage in sexual activity.   Continue with your regular eye exams as directed by your caregiver.  What to expect:  It is normal to feel itching and mild discomfort for a few days after cataract surgery. Some fluid discharge is also common, and your eye may be sensitive to light and touch.   After 1 to 2 days, even moderate discomfort should disappear. In most cases, healing will take about 6 weeks.   If you received an intraocular lens (IOL), you may notice that colors are very bright or have a blue tinge. Also, if you have been in bright sunlight, everything may appear  reddish for a few hours. If you see these color tinges, it is because your lens is clear and no longer cloudy. Within a few months after receiving an IOL, these extra colors should go away. When you have healed, you will probably need new glasses.  SEEK MEDICAL CARE IF:   You have increased bruising around your eye.   You have discomfort not helped by medicine.  SEEK IMMEDIATE MEDICAL CARE IF:   You have a fever.   You have a worsening or sudden vision loss.   You have redness, swelling, or increasing pain in the eye.   You have a thick discharge from the eye that had surgery.  MAKE SURE YOU:  Understand these instructions.   Will watch your condition.   Will get help right away if you are not doing well or get worse.  Document Released: 01/20/2005 Document Revised: 06/22/2011 Document Reviewed: 02/24/2011 [facility] Patient Information 2012 [place].    Monitored Anesthesia Care  Monitored anesthesia care is an anesthesia service for a medical procedure. Anesthesia is the loss of the ability to feel pain. It is produced by medications called anesthetics. It may affect a small area of your body (local  anesthesia), a large area of your body (regional anesthesia), or your entire body (general anesthesia). The need for monitored anesthesia care depends your procedure, your condition, and the potential need for regional or general anesthesia. It is often provided during procedures where:   General anesthesia may be needed if there are complications. This is because you need special care when you are under general anesthesia.   You will be under local or regional anesthesia. This is so that you are able to have higher levels of anesthesia if needed.   You will receive calming medications (sedatives). This is especially the case if sedatives are given to put you in a semi-conscious state of relaxation (deep sedation). This is because the amount of sedative needed to produce this state can be hard to predict. Too much of a sedative can produce general anesthesia. Monitored anesthesia care is performed by one or more caregivers who have special training in all types of anesthesia. You will need to meet with these caregivers before your procedure. During this meeting, they will ask you about your medical history. They will also give you instructions to follow. (For example, you will need to stop eating and drinking before your procedure. You may also need to stop or change medications you are taking.) During your procedure, your caregivers will stay with you. They will:   Watch your condition. This includes watching you blood pressure, breathing, and level of pain.   Diagnose and treat problems that occur.   Give medications if they are needed. These may include calming medications (sedatives) and anesthetics.   Make sure you are comfortable.  Having monitored anesthesia care does not necessarily mean that you will be under anesthesia. It does mean that your caregivers will be able to manage anesthesia if you need it or if it occurs. It also means that you will be able to have a different type of  anesthesia than you are having if you need it. When your procedure is complete, your caregivers will continue to watch your condition. They will make sure any medications wear off before you are allowed to go home.  Document Released: 03/29/2005 Document Revised: 10/28/2012 Document Reviewed: 08/14/2012 [facility] Patient Information 2014 [place], [place].

## 2014-02-16 NOTE — Pre-Procedure Instructions (Signed)
Patient given instructions with web site and code to sign up for "My Chart"  

## 2014-02-18 MED ORDER — PHENYLEPHRINE HCL 2.5 % OP SOLN
OPHTHALMIC | Status: AC
  Filled 2014-02-18: qty 15

## 2014-02-18 MED ORDER — LIDOCAINE HCL 3.5 % OP GEL
OPHTHALMIC | Status: AC
Start: 2014-02-18 — End: 2014-02-18
  Filled 2014-02-18: qty 1

## 2014-02-18 MED ORDER — LIDOCAINE HCL (PF) 1 % IJ SOLN
INTRAMUSCULAR | Status: AC
  Filled 2014-02-18: qty 2

## 2014-02-18 MED ORDER — CYCLOPENTOLATE-PHENYLEPHRINE OP SOLN OPTIME - NO CHARGE
OPHTHALMIC | Status: AC
  Filled 2014-02-18: qty 2

## 2014-02-18 MED ORDER — TETRACAINE HCL 0.5 % OP SOLN
OPHTHALMIC | Status: AC
  Filled 2014-02-18: qty 2

## 2014-02-18 MED ORDER — NEOMYCIN-POLYMYXIN-DEXAMETH 3.5-10000-0.1 OP SUSP
OPHTHALMIC | Status: AC
  Filled 2014-02-18: qty 5

## 2014-02-19 ENCOUNTER — Ambulatory Visit (HOSPITAL_COMMUNITY)
Admission: RE | Admit: 2014-02-19 | Discharge: 2014-02-19 | Disposition: A | Discharge status: Home / Self Care | Payer: Medicare Other | Source: Ambulatory Visit | Attending: Ophthalmology | Admitting: Ophthalmology

## 2014-02-19 ENCOUNTER — Ambulatory Visit (HOSPITAL_COMMUNITY): Payer: Medicare Other | Admitting: Anesthesiology

## 2014-02-19 ENCOUNTER — Encounter (HOSPITAL_COMMUNITY): Payer: Medicare Other | Admitting: Anesthesiology

## 2014-02-19 ENCOUNTER — Encounter (HOSPITAL_COMMUNITY): Payer: Self-pay | Admitting: *Deleted

## 2014-02-19 ENCOUNTER — Encounter (HOSPITAL_COMMUNITY)
Admission: RE | Disposition: A | Discharge status: Home / Self Care | Payer: Self-pay | Source: Ambulatory Visit | Attending: Ophthalmology

## 2014-02-19 DIAGNOSIS — I2510 Atherosclerotic heart disease of native coronary artery without angina pectoris: Secondary | ICD-10-CM | POA: Insufficient documentation

## 2014-02-19 DIAGNOSIS — H2510 Age-related nuclear cataract, unspecified eye: Secondary | ICD-10-CM | POA: Diagnosis not present

## 2014-02-19 DIAGNOSIS — I10 Essential (primary) hypertension: Secondary | ICD-10-CM | POA: Diagnosis not present

## 2014-02-19 DIAGNOSIS — Z87891 Personal history of nicotine dependence: Secondary | ICD-10-CM | POA: Diagnosis not present

## 2014-02-19 HISTORY — PX: CATARACT EXTRACTION W/PHACO: SHX586

## 2014-02-19 SURGERY — PHACOEMULSIFICATION, CATARACT, WITH IOL INSERTION
Anesthesia: Monitor Anesthesia Care | Site: Eye | Laterality: Right

## 2014-02-19 MED ORDER — MIDAZOLAM HCL 2 MG/2ML IJ SOLN
INTRAMUSCULAR | Status: AC
  Filled 2014-02-19: qty 2

## 2014-02-19 MED ORDER — CYCLOPENTOLATE-PHENYLEPHRINE 0.2-1 % OP SOLN
1.0000 [drp] | OPHTHALMIC | Status: AC
Start: 2014-02-19 — End: 2014-02-19
  Administered 2014-02-19 (×3): 1 [drp] via OPHTHALMIC

## 2014-02-19 MED ORDER — FENTANYL CITRATE 0.05 MG/ML IJ SOLN
25.0000 ug | INTRAMUSCULAR | Status: AC
  Administered 2014-02-19: 25 ug via INTRAVENOUS

## 2014-02-19 MED ORDER — LIDOCAINE 3.5 % OP GEL OPTIME - NO CHARGE
OPHTHALMIC | Status: DC | PRN
  Administered 2014-02-19: 2 [drp] via OPHTHALMIC

## 2014-02-19 MED ORDER — LIDOCAINE HCL (PF) 1 % IJ SOLN
INTRAMUSCULAR | Status: DC | PRN
  Administered 2014-02-19: .4 mL

## 2014-02-19 MED ORDER — ONDANSETRON HCL 4 MG/2ML IJ SOLN: 4.0000 mg | Freq: Once | INTRAMUSCULAR | Status: DC | PRN

## 2014-02-19 MED ORDER — LACTATED RINGERS IV SOLN
INTRAVENOUS | Status: DC
  Administered 2014-02-19: 07:00:00 via INTRAVENOUS

## 2014-02-19 MED ORDER — PHENYLEPHRINE HCL 2.5 % OP SOLN
1.0000 [drp] | OPHTHALMIC | Status: AC
  Administered 2014-02-19 (×3): 1 [drp] via OPHTHALMIC

## 2014-02-19 MED ORDER — PROVISC 10 MG/ML IO SOLN
INTRAOCULAR | Status: DC | PRN
  Administered 2014-02-19: 0.85 mL via INTRAOCULAR

## 2014-02-19 MED ORDER — POVIDONE-IODINE 5 % OP SOLN
OPHTHALMIC | Status: DC | PRN
  Administered 2014-02-19: 1 via OPHTHALMIC

## 2014-02-19 MED ORDER — NEOMYCIN-POLYMYXIN-DEXAMETH 3.5-10000-0.1 OP SUSP
OPHTHALMIC | Status: DC | PRN
  Administered 2014-02-19: 2 [drp]

## 2014-02-19 MED ORDER — EPINEPHRINE HCL 1 MG/ML IJ SOLN
INTRAMUSCULAR | Status: AC
  Filled 2014-02-19: qty 1

## 2014-02-19 MED ORDER — TETRACAINE HCL 0.5 % OP SOLN
1.0000 [drp] | OPHTHALMIC | Status: AC
  Administered 2014-02-19 (×3): 1 [drp] via OPHTHALMIC

## 2014-02-19 MED ORDER — FENTANYL CITRATE 0.05 MG/ML IJ SOLN
INTRAMUSCULAR | Status: AC
  Filled 2014-02-19: qty 2

## 2014-02-19 MED ORDER — EPINEPHRINE HCL 1 MG/ML IJ SOLN
INTRAOCULAR | Status: DC | PRN
  Administered 2014-02-19: 08:00:00

## 2014-02-19 MED ORDER — FENTANYL CITRATE 0.05 MG/ML IJ SOLN: 25.0000 ug | INTRAMUSCULAR | Status: DC | PRN

## 2014-02-19 MED ORDER — BSS IO SOLN
INTRAOCULAR | Status: DC | PRN
  Administered 2014-02-19: 15 mL via INTRAOCULAR

## 2014-02-19 MED ORDER — MIDAZOLAM HCL 2 MG/2ML IJ SOLN
1.0000 mg | INTRAMUSCULAR | Status: DC | PRN
  Administered 2014-02-19: 2 mg via INTRAVENOUS

## 2014-02-19 MED ORDER — LIDOCAINE HCL 3.5 % OP GEL
1.0000 "application " | Freq: Once | OPHTHALMIC | Status: AC
  Administered 2014-02-19: 1 via OPHTHALMIC

## 2014-02-19 SURGICAL SUPPLY — 34 items
CAPSULAR TENSION RING-AMO (OPHTHALMIC RELATED) IMPLANT
CLOTH BEACON ORANGE TIMEOUT ST (SAFETY) ×2 IMPLANT
EYE SHIELD UNIVERSAL CLEAR (GAUZE/BANDAGES/DRESSINGS) ×2 IMPLANT
GLOVE BIO SURGEON STRL SZ 6.5 (GLOVE) IMPLANT
GLOVE BIO SURGEONS STRL SZ 6.5 (GLOVE)
GLOVE BIOGEL PI IND STRL 6.5 (GLOVE) IMPLANT
GLOVE BIOGEL PI IND STRL 7.0 (GLOVE) IMPLANT
GLOVE BIOGEL PI IND STRL 7.5 (GLOVE) IMPLANT
GLOVE BIOGEL PI INDICATOR 6.5 (GLOVE)
GLOVE BIOGEL PI INDICATOR 7.0 (GLOVE) ×2
GLOVE BIOGEL PI INDICATOR 7.5 (GLOVE)
GLOVE ECLIPSE 6.5 STRL STRAW (GLOVE) IMPLANT
GLOVE ECLIPSE 7.0 STRL STRAW (GLOVE) IMPLANT
GLOVE ECLIPSE 7.5 STRL STRAW (GLOVE) IMPLANT
GLOVE EXAM NITRILE LRG STRL (GLOVE) IMPLANT
GLOVE EXAM NITRILE MD LF STRL (GLOVE) ×2 IMPLANT
GLOVE SKINSENSE NS SZ6.5 (GLOVE)
GLOVE SKINSENSE NS SZ7.0 (GLOVE)
GLOVE SKINSENSE STRL SZ6.5 (GLOVE) IMPLANT
GLOVE SKINSENSE STRL SZ7.0 (GLOVE) IMPLANT
KIT VITRECTOMY (OPHTHALMIC RELATED) IMPLANT
PAD ARMBOARD 7.5X6 YLW CONV (MISCELLANEOUS) ×2 IMPLANT
PROC W NO LENS (INTRAOCULAR LENS)
PROC W SPEC LENS (INTRAOCULAR LENS)
PROCESS W NO LENS (INTRAOCULAR LENS) IMPLANT
PROCESS W SPEC LENS (INTRAOCULAR LENS) IMPLANT
RETRACTOR IRIS SIGHTPATH (OPHTHALMIC RELATED) ×1 IMPLANT
RING MALYGIN (MISCELLANEOUS) IMPLANT
SIGHTPATH CAT PROC W REG LENS (Ophthalmic Related) ×3 IMPLANT
SYRINGE LUER LOK 1CC (MISCELLANEOUS) ×2 IMPLANT
TAPE SURG TRANSPORE 1 IN (GAUZE/BANDAGES/DRESSINGS) IMPLANT
TAPE SURGICAL TRANSPORE 1 IN (GAUZE/BANDAGES/DRESSINGS) ×2
VISCOELASTIC ADDITIONAL (OPHTHALMIC RELATED) IMPLANT
WATER STERILE IRR 250ML POUR (IV SOLUTION) ×2 IMPLANT

## 2014-02-19 NOTE — Anesthesia Preprocedure Evaluation (Addendum)
Anesthesia Evaluation  Patient identified by MRN, date of birth, ID band Patient awake    Reviewed: Allergy & Precautions, H&P , NPO status , Patient's Chart, lab work & pertinent test results  Airway Mallampati: II TM Distance: >3 FB     Dental  (+) Teeth Intact, Partial Lower   Pulmonary former smoker,  breath sounds clear to auscultation        Cardiovascular hypertension, Pt. on medications + CAD Rhythm:Regular Rate:Normal     Neuro/Psych    GI/Hepatic   Endo/Other    Renal/GU      Musculoskeletal   Abdominal   Peds  Hematology   Anesthesia Other Findings   Reproductive/Obstetrics                          Anesthesia Physical Anesthesia Plan  ASA: III  Anesthesia Plan: MAC   Post-op Pain Management:    Induction: Intravenous  Airway Management Planned: Nasal Cannula  Additional Equipment:   Intra-op Plan:   Post-operative Plan:   Informed Consent: I have reviewed the patients History and Physical, chart, labs and discussed the procedure including the risks, benefits and alternatives for the proposed anesthesia with the patient or authorized representative who has indicated his/her understanding and acceptance.     Plan Discussed with:   Anesthesia Plan Comments:         Anesthesia Quick Evaluation

## 2014-02-19 NOTE — Anesthesia Postprocedure Evaluation (Signed)
  Anesthesia Post-op Note  Patient: Richard Mcpherson  Procedure(s) Performed: Procedure(s) (LRB): CATARACT EXTRACTION PHACO AND INTRAOCULAR LENS PLACEMENT (IOC) (Right)  Patient Location:  Short Stay  Anesthesia Type: MAC  Level of Consciousness: awake  Airway and Oxygen Therapy: Patient Spontanous Breathing  Post-op Pain: none  Post-op Assessment: Post-op Vital signs reviewed, Patient's Cardiovascular Status Stable, Respiratory Function Stable, Patent Airway, No signs of Nausea or vomiting and Pain level controlled  Post-op Vital Signs: Reviewed and stable  Complications: No apparent anesthesia complications

## 2014-02-19 NOTE — Op Note (Signed)
Date of Admission: 02/19/2014  Date of Surgery: 02/19/2014   Pre-Op Dx: Cataract Right Eye  Post-Op Dx: Senile Nuclear  Cataract Right  Eye,  Dx Code 366.16  Surgeon: Gemma PayorKerry Jenea Dake, M.D.  Assistants: None  Anesthesia: Topical with MAC  Indications: Painless, progressive loss of vision with compromise of daily activities.  Surgery: Cataract Extraction with Intraocular lens Implant Right Eye  Discription: The patient had dilating drops and viscous lidocaine placed into the Right eye in the pre-op holding area. After transfer to the operating room, a time out was performed. The patient was then prepped and draped. Beginning with a 75 degree blade a paracentesis port was made at the surgeon's 2 o'clock position. The anterior chamber was then filled with 1% non-preserved lidocaine. This was followed by filling the anterior chamber with Provisc.  A 2.734mm keratome blade was used to make a clear corneal incision at the temporal limbus.  A bent cystatome needle was used to create a continuous tear capsulotomy. Hydrodissection was performed with balanced salt solution on a Fine canula. The lens nucleus was then removed using the phacoemulsification handpiece. Residual cortex was removed with the I&A handpiece. The anterior chamber and capsular bag were refilled with Provisc. A posterior chamber intraocular lens was placed into the capsular bag with it's injector. The implant was positioned with the Kuglan hook. The Provisc was then removed from the anterior chamber and capsular bag with the I&A handpiece. Stromal hydration of the main incision and paracentesis port was performed with BSS on a Fine canula. The wounds were tested for leak which was negative. The patient tolerated the procedure well. There were no operative complications. The patient was then transferred to the recovery room in stable condition.  Complications: None  Specimen: None  EBL: None  Prosthetic device: Hoya iSert 250, power 19.5 D,  SN [id number].

## 2014-02-19 NOTE — Transfer of Care (Signed)
Immediate Anesthesia Transfer of Care Note  Patient: Richard CassetteDavid E Mcpherson  Procedure(s) Performed: Procedure(s) (LRB): CATARACT EXTRACTION PHACO AND INTRAOCULAR LENS PLACEMENT (IOC) (Right)  Patient Location: Shortstay  Anesthesia Type: MAC  Level of Consciousness: awake  Airway & Oxygen Therapy: Patient Spontanous Breathing   Post-op Assessment: Report given to PACU RN, Post -op Vital signs reviewed and stable and Patient moving all extremities  Post vital signs: Reviewed and stable  Complications: No apparent anesthesia complications

## 2014-02-19 NOTE — Anesthesia Procedure Notes (Signed)
Procedure Name: MAC Date/Time: 02/19/2014 8:10 AM Performed by: Franco Nones Pre-anesthesia Checklist: Patient identified, Emergency Drugs available, Suction available, Timeout performed and Patient being monitored Patient Re-evaluated:Patient Re-evaluated prior to inductionOxygen Delivery Method: Nasal Cannula

## 2014-02-19 NOTE — H&P (Signed)
I have reviewed the H&P, the patient was re-examined, and I have identified no interval changes in medical condition and plan of care since the history and physical of record  

## 2014-02-20 ENCOUNTER — Encounter (HOSPITAL_COMMUNITY): Payer: Self-pay | Admitting: Ophthalmology

## 2014-12-21 NOTE — Patient Instructions (Addendum)
Your procedure is scheduled on:  12/28/14  Report to [facility] at 06:30 AM.  Call this number if you have problems the morning of surgery: [phone]   Remember:   Do not eat food or drink liquids after midnight.   Take these medicines the morning of surgery with A SIP OF WATER: Atenolol   Do not wear jewelry, make-up or nail polish.  Do not wear lotions, powders, or perfumes. You may wear deodorant.  Do not bring valuables to the hospital.  [facility] is not responsible for any belongings or valuables.               Contacts, dentures or bridgework may not be worn into surgery.               Patients discharged the day of surgery will not be allowed to drive home.   Special Instructions: Start using your eye drops prior to surgery as directed by your eye doctor.   Please read over the following fact sheets that you were given: Anesthesia Post-op Instructions and Care and Recovery After Surgery     Cataract Surgery  A cataract is a clouding of the lens of the eye. When a lens becomes cloudy, vision is reduced based on the degree and nature of the clouding. Surgery may be needed to improve vision. Surgery removes the cloudy lens and usually replaces it with a substitute lens (intraocular lens, IOL). LET YOUR EYE DOCTOR KNOW ABOUT:  Allergies to food or medicine.  Medicines taken including herbs, eyedrops, over-the-counter medicines, and creams.  Use of steroids (by mouth or creams).  Previous problems with anesthetics or numbing medicine.  History of bleeding problems or blood clots.  Previous surgery.  Other health problems, including diabetes and kidney problems.  Possibility of pregnancy, if this applies. RISKS AND COMPLICATIONS  Infection.  Inflammation of the eyeball (endophthalmitis) that can spread to both eyes (sympathetic ophthalmia).  Poor wound healing.  If an IOL is inserted, it can later fall out of proper position. This is very uncommon.  Clouding  of the part of your eye that holds an IOL in place. This is called an "after-cataract." These are uncommon, but easily treated. BEFORE THE PROCEDURE  Do not eat or drink anything except small amounts of water for 8 to 12 before your surgery, or as directed by your caregiver.  Unless you are told otherwise, continue any eyedrops you have been prescribed.  Talk to your primary caregiver about all other medicines that you take (both prescription and non-prescription). In some cases, you may need to stop or change medicines near the time of your surgery. This is most important if you are taking blood-thinning medicine.Do not stop medicines unless you are told to do so.  Arrange for someone to drive you to and from the procedure.  Do not put contact lenses in either eye on the day of your surgery. PROCEDURE There is more than one method for safely removing a cataract. Your doctor can explain the differences and help determine which is best for you. Phacoemulsification surgery is the most common form of cataract surgery.  An injection is given behind the eye or eyedrops are given to make this a painless procedure.  A small cut (incision) is made on the edge of the clear, dome-shaped surface that covers the front of the eye (cornea).  A tiny probe is painlessly inserted into the eye. This device gives off ultrasound waves that soften and break up the  cloudy center of the lens. This makes it easier for the cloudy lens to be removed by suction.  An IOL may be implanted.  The normal lens of the eye is covered by a clear capsule. Part of that capsule is intentionally left in the eye to support the IOL.  Your surgeon may or may not use stitches to close the incision. There are other forms of cataract surgery that require a larger incision and stiches to close the eye. This approach is taken in cases where the doctor feels that the cataract cannot be easily removed using phacoemulsification. AFTER  THE PROCEDURE  When an IOL is implanted, it does not need care. It becomes a permanent part of your eye and cannot be seen or felt.  Your doctor will schedule follow-up exams to check on your progress.  Review your other medicines with your doctor to see which can be resumed after surgery.  Use eyedrops or take medicine as prescribed by your doctor. Document Released: 06/22/2011 Document Revised: 09/25/2011 Document Reviewed: 06/22/2011 [facility] Patient Information 2013 [place], [place].    PATIENT INSTRUCTIONS POST-ANESTHESIA  IMMEDIATELY FOLLOWING SURGERY:  Do not drive or operate machinery for the first twenty four hours after surgery.  Do not make any important decisions for twenty four hours after surgery or while taking narcotic pain medications or sedatives.  If you develop intractable nausea and vomiting or a severe headache please notify your doctor immediately.  FOLLOW-UP:  Please make an appointment with your surgeon as instructed. You do not need to follow up with anesthesia unless specifically instructed to do so.  WOUND CARE INSTRUCTIONS (if applicable):  Keep a dry clean dressing on the anesthesia/puncture wound site if there is drainage.  Once the wound has quit draining you may leave it open to air.  Generally you should leave the bandage intact for twenty four hours unless there is drainage.  If the epidural site drains for more than 36-48 hours please call the anesthesia department.  QUESTIONS?:  Please feel free to call your physician or the hospital operator if you have any questions, and they will be happy to assist you.

## 2014-12-22 ENCOUNTER — Encounter (HOSPITAL_COMMUNITY)
Admission: RE | Admit: 2014-12-22 | Discharge: 2014-12-22 | Disposition: A | Discharge status: Home / Self Care | Payer: Medicare Other | Source: Ambulatory Visit | Attending: Ophthalmology | Admitting: Ophthalmology

## 2014-12-22 ENCOUNTER — Encounter (HOSPITAL_COMMUNITY): Payer: Self-pay

## 2014-12-22 DIAGNOSIS — H2512 Age-related nuclear cataract, left eye: Secondary | ICD-10-CM | POA: Insufficient documentation

## 2014-12-22 DIAGNOSIS — Z01818 Encounter for other preprocedural examination: Secondary | ICD-10-CM | POA: Insufficient documentation

## 2014-12-22 LAB — BASIC METABOLIC PANEL
ANION GAP: 6 (ref 5–15)
BUN: 15 mg/dL (ref 6–20)
CO2: 27 mmol/L (ref 22–32)
CREATININE: 0.94 mg/dL (ref 0.61–1.24)
Calcium: 9 mg/dL (ref 8.9–10.3)
Chloride: 106 mmol/L (ref 101–111)
GFR calc Af Amer: >60 mL/min (ref >60)
GFR calc non Af Amer: >60 mL/min (ref >60)
Glucose, Bld: 100 mg/dL — ABNORMAL HIGH (ref 65–99)
Potassium: 4.2 mmol/L (ref 3.5–5.1)
SODIUM: 139 mmol/L (ref 135–145)

## 2014-12-22 LAB — CBC
HEMATOCRIT: 50 % (ref 39.0–52.0)
Hemoglobin: 16.8 g/dL (ref 13.0–17.0)
MCH: 30.6 pg (ref 26.0–34.0)
MCHC: 33.6 g/dL (ref 30.0–36.0)
MCV: 91.1 fL (ref 78.0–100.0)
Platelets: 168 10*3/uL (ref 150–400)
RBC: 5.49 MIL/uL (ref 4.22–5.81)
RDW: 13.7 % (ref 11.5–15.5)
WBC: 7.4 10*3/uL (ref 4.0–10.5)

## 2014-12-25 MED ORDER — CYCLOPENTOLATE-PHENYLEPHRINE OP SOLN OPTIME - NO CHARGE
OPHTHALMIC | Status: AC
  Filled 2014-12-25: qty 2

## 2014-12-25 MED ORDER — TETRACAINE HCL 0.5 % OP SOLN
OPHTHALMIC | Status: AC
  Filled 2014-12-25: qty 2

## 2014-12-25 MED ORDER — LIDOCAINE HCL 3.5 % OP GEL
OPHTHALMIC | Status: AC
  Filled 2014-12-25: qty 1

## 2014-12-25 MED ORDER — PHENYLEPHRINE HCL 2.5 % OP SOLN
OPHTHALMIC | Status: AC
  Filled 2014-12-25: qty 15

## 2014-12-25 MED ORDER — LIDOCAINE HCL (PF) 1 % IJ SOLN
INTRAMUSCULAR | Status: AC
  Filled 2014-12-25: qty 2

## 2014-12-25 MED ORDER — NEOMYCIN-POLYMYXIN-DEXAMETH 3.5-10000-0.1 OP SUSP
OPHTHALMIC | Status: AC
  Filled 2014-12-25: qty 5

## 2014-12-28 ENCOUNTER — Ambulatory Visit (HOSPITAL_COMMUNITY): Payer: Medicare Other | Admitting: Anesthesiology

## 2014-12-28 ENCOUNTER — Encounter (HOSPITAL_COMMUNITY): Payer: Self-pay

## 2014-12-28 ENCOUNTER — Encounter (HOSPITAL_COMMUNITY)
Admission: RE | Disposition: A | Discharge status: Home / Self Care | Payer: Self-pay | Source: Ambulatory Visit | Attending: Ophthalmology

## 2014-12-28 ENCOUNTER — Ambulatory Visit (HOSPITAL_COMMUNITY)
Admission: RE | Admit: 2014-12-28 | Discharge: 2014-12-28 | Disposition: A | Discharge status: Home / Self Care | Payer: Medicare Other | Source: Ambulatory Visit | Attending: Ophthalmology | Admitting: Ophthalmology

## 2014-12-28 DIAGNOSIS — I10 Essential (primary) hypertension: Secondary | ICD-10-CM | POA: Insufficient documentation

## 2014-12-28 DIAGNOSIS — I2510 Atherosclerotic heart disease of native coronary artery without angina pectoris: Secondary | ICD-10-CM | POA: Diagnosis not present

## 2014-12-28 DIAGNOSIS — M1990 Unspecified osteoarthritis, unspecified site: Secondary | ICD-10-CM | POA: Diagnosis not present

## 2014-12-28 DIAGNOSIS — H269 Unspecified cataract: Secondary | ICD-10-CM | POA: Diagnosis present

## 2014-12-28 DIAGNOSIS — Z87891 Personal history of nicotine dependence: Secondary | ICD-10-CM | POA: Diagnosis not present

## 2014-12-28 DIAGNOSIS — Z79899 Other long term (current) drug therapy: Secondary | ICD-10-CM | POA: Insufficient documentation

## 2014-12-28 DIAGNOSIS — H2512 Age-related nuclear cataract, left eye: Secondary | ICD-10-CM | POA: Diagnosis not present

## 2014-12-28 HISTORY — PX: CATARACT EXTRACTION W/PHACO: SHX586

## 2014-12-28 SURGERY — PHACOEMULSIFICATION, CATARACT, WITH IOL INSERTION
Anesthesia: Monitor Anesthesia Care | Site: Eye | Laterality: Left

## 2014-12-28 MED ORDER — PHENYLEPHRINE HCL 2.5 % OP SOLN
1.0000 [drp] | OPHTHALMIC | Status: AC
  Administered 2014-12-28 (×3): 1 [drp] via OPHTHALMIC

## 2014-12-28 MED ORDER — FENTANYL CITRATE (PF) 100 MCG/2ML IJ SOLN
25.0000 ug | INTRAMUSCULAR | Status: AC
  Administered 2014-12-28 (×2): 25 ug via INTRAVENOUS

## 2014-12-28 MED ORDER — LACTATED RINGERS IV SOLN
INTRAVENOUS | Status: DC | PRN
  Administered 2014-12-28: 07:00:00 via INTRAVENOUS

## 2014-12-28 MED ORDER — MIDAZOLAM HCL 2 MG/2ML IJ SOLN
1.0000 mg | INTRAMUSCULAR | Status: DC | PRN
  Administered 2014-12-28: 2 mg via INTRAVENOUS

## 2014-12-28 MED ORDER — EPINEPHRINE HCL 1 MG/ML IJ SOLN
INTRAOCULAR | Status: DC | PRN
  Administered 2014-12-28: 500 mL

## 2014-12-28 MED ORDER — TETRACAINE HCL 0.5 % OP SOLN
1.0000 [drp] | OPHTHALMIC | Status: AC
  Administered 2014-12-28 (×3): 1 [drp] via OPHTHALMIC

## 2014-12-28 MED ORDER — LIDOCAINE HCL (PF) 1 % IJ SOLN
INTRAMUSCULAR | Status: DC | PRN
  Administered 2014-12-28: .8 mL

## 2014-12-28 MED ORDER — EPINEPHRINE HCL 1 MG/ML IJ SOLN
INTRAMUSCULAR | Status: AC
  Filled 2014-12-28: qty 1

## 2014-12-28 MED ORDER — BSS IO SOLN
INTRAOCULAR | Status: DC | PRN
  Administered 2014-12-28: 15 mL

## 2014-12-28 MED ORDER — LACTATED RINGERS IV SOLN
INTRAVENOUS | Status: DC
  Administered 2014-12-28: 08:00:00 via INTRAVENOUS

## 2014-12-28 MED ORDER — CYCLOPENTOLATE-PHENYLEPHRINE 0.2-1 % OP SOLN
1.0000 [drp] | OPHTHALMIC | Status: AC
  Administered 2014-12-28 (×3): 1 [drp] via OPHTHALMIC

## 2014-12-28 MED ORDER — POVIDONE-IODINE 5 % OP SOLN
OPHTHALMIC | Status: DC | PRN
  Administered 2014-12-28: 1 via OPHTHALMIC

## 2014-12-28 MED ORDER — FENTANYL CITRATE (PF) 100 MCG/2ML IJ SOLN
INTRAMUSCULAR | Status: AC
  Filled 2014-12-28: qty 2

## 2014-12-28 MED ORDER — NEOMYCIN-POLYMYXIN-DEXAMETH 3.5-10000-0.1 OP SUSP
OPHTHALMIC | Status: DC | PRN
  Administered 2014-12-28: 2 [drp] via OPHTHALMIC

## 2014-12-28 MED ORDER — MIDAZOLAM HCL 2 MG/2ML IJ SOLN
INTRAMUSCULAR | Status: AC
  Filled 2014-12-28: qty 2

## 2014-12-28 MED ORDER — PROVISC 10 MG/ML IO SOLN
INTRAOCULAR | Status: DC | PRN
  Administered 2014-12-28: 0.85 mL via INTRAOCULAR

## 2014-12-28 MED ORDER — LIDOCAINE HCL 3.5 % OP GEL
1.0000 "application " | Freq: Once | OPHTHALMIC | Status: AC
  Administered 2014-12-28: 1 via OPHTHALMIC

## 2014-12-28 SURGICAL SUPPLY — 10 items
CLOTH BEACON ORANGE TIMEOUT ST (SAFETY) ×3 IMPLANT
EYE SHIELD UNIVERSAL CLEAR (GAUZE/BANDAGES/DRESSINGS) ×2 IMPLANT
GLOVE BIOGEL PI IND STRL 7.0 (GLOVE) IMPLANT
GLOVE BIOGEL PI INDICATOR 7.0 (GLOVE) ×4
PAD ARMBOARD 7.5X6 YLW CONV (MISCELLANEOUS) ×3 IMPLANT
SIGHTPATH CAT PROC W REG LENS (Ophthalmic Related) ×3 IMPLANT
SYRINGE LUER LOK 1CC (MISCELLANEOUS) ×2 IMPLANT
TAPE SURG TRANSPORE 1 IN (GAUZE/BANDAGES/DRESSINGS) IMPLANT
TAPE SURGICAL TRANSPORE 1 IN (GAUZE/BANDAGES/DRESSINGS) ×2
WATER STERILE IRR 250ML POUR (IV SOLUTION) ×2 IMPLANT

## 2014-12-28 NOTE — Op Note (Signed)
Date of Admission: 12/28/2014  Date of Surgery: 12/28/2014   Pre-Op Dx: Cataract Left Eye  Post-Op Dx: Senile Nuclear Cataract Left  Eye,  Dx Code H25.12  Surgeon: Gemma Payor, M.D.  Assistants: None  Anesthesia: Topical with MAC  Indications: Painless, progressive loss of vision with compromise of daily activities.  Surgery: Cataract Extraction with Intraocular lens Implant Left Eye  Discription: The patient had dilating drops and viscous lidocaine placed into the Left eye in the pre-op holding area. After transfer to the operating room, a time out was performed. The patient was then prepped and draped. Beginning with a 75 degree blade a paracentesis port was made at the surgeon's 2 o'clock position. The anterior chamber was then filled with 1% non-preserved lidocaine. This was followed by filling the anterior chamber with Provisc.  A 2.24mm keratome blade was used to make a clear corneal incision at the temporal limbus.  A bent cystatome needle was used to create a continuous tear capsulotomy. Hydrodissection was performed with balanced salt solution on a Fine canula. The lens nucleus was then removed using the phacoemulsification handpiece. Residual cortex was removed with the I&A handpiece. The anterior chamber and capsular bag were refilled with Provisc. A posterior chamber intraocular lens was placed into the capsular bag with it's injector. The implant was positioned with the Kuglan hook. The Provisc was then removed from the anterior chamber and capsular bag with the I&A handpiece. Stromal hydration of the main incision and paracentesis port was performed with BSS on a Fine canula. The wounds were tested for leak which was negative. The patient tolerated the procedure well. There were no operative complications. The patient was then transferred to the recovery room in stable condition.  Complications: None  Specimen: None  EBL: None  Prosthetic device: Hoya iSert 250, power 19.5 D, SN  [id number].

## 2014-12-28 NOTE — H&P (Signed)
I have reviewed the H&P, the patient was re-examined, and I have identified no interval changes in medical condition and plan of care since the history and physical of record  

## 2014-12-28 NOTE — Anesthesia Postprocedure Evaluation (Signed)
  Anesthesia Post-op Note  Patient: Richard Mcpherson  Procedure(s) Performed: Procedure(s) with comments: CATARACT EXTRACTION PHACO AND INTRAOCULAR LENS PLACEMENT (IOC) (Left) - CDE:8.05  Patient Location: Short Stay  Anesthesia Type:MAC  Level of Consciousness: awake, alert , oriented and patient cooperative  Airway and Oxygen Therapy: Patient Spontanous Breathing  Post-op Pain: none  Post-op Assessment: Post-op Vital signs reviewed, Patient's Cardiovascular Status Stable, Respiratory Function Stable, Patent Airway, No signs of Nausea or vomiting and Pain level controlled              Post-op Vital Signs: Reviewed and stable  Last Vitals:  Filed Vitals:   12/28/14 0825  BP: 113/70  Pulse:   Temp:   Resp: 16    Complications: No apparent anesthesia complications

## 2014-12-28 NOTE — Anesthesia Procedure Notes (Signed)
Procedure Name: MAC Date/Time: 12/28/2014 8:34 AM Performed by: Pernell Dupre, AMY A Pre-anesthesia Checklist: Patient identified, Timeout performed, Emergency Drugs available, Suction available and Patient being monitored Oxygen Delivery Method: Nasal cannula

## 2014-12-28 NOTE — Discharge Instructions (Signed)

## 2014-12-28 NOTE — Anesthesia Preprocedure Evaluation (Signed)
Anesthesia Evaluation  Patient identified by MRN, date of birth, ID band Patient awake    Reviewed: Allergy & Precautions, H&P , NPO status , Patient's Chart, lab work & pertinent test results  Airway Mallampati: II TM Distance: >3 FB     Dental  (+) Teeth Intact, Partial Lower   Pulmonary former smoker,  breath sounds clear to auscultation        Cardiovascular hypertension, Pt. on medications + CAD Rhythm:Regular Rate:Normal     Neuro/Psych    GI/Hepatic   Endo/Other    Renal/GU      Musculoskeletal   Abdominal   Peds  Hematology   Anesthesia Other Findings   Reproductive/Obstetrics                          Anesthesia Physical Anesthesia Plan  ASA: III  Anesthesia Plan: MAC   Post-op Pain Management:    Induction: Intravenous  Airway Management Planned: Nasal Cannula  Additional Equipment:   Intra-op Plan:   Post-operative Plan:   Informed Consent: I have reviewed the patients History and Physical, chart, labs and discussed the procedure including the risks, benefits and alternatives for the proposed anesthesia with the patient or authorized representative who has indicated his/her understanding and acceptance.     Plan Discussed with:   Anesthesia Plan Comments:         Anesthesia Quick Evaluation  

## 2014-12-28 NOTE — Transfer of Care (Signed)
Immediate Anesthesia Transfer of Care Note  Patient: Richard Mcpherson  Procedure(s) Performed: Procedure(s) with comments: CATARACT EXTRACTION PHACO AND INTRAOCULAR LENS PLACEMENT (IOC) (Left) - CDE:8.05  Patient Location: Short Stay  Anesthesia Type:MAC  Level of Consciousness: awake, alert , oriented and patient cooperative  Airway & Oxygen Therapy: Patient Spontanous Breathing  Post-op Assessment: Report given to RN and Post -op Vital signs reviewed and stable  Post vital signs: Reviewed and stable  Last Vitals:  Filed Vitals:   12/28/14 0825  BP: 113/70  Pulse:   Temp:   Resp: 16    Complications: No apparent anesthesia complications

## 2014-12-29 ENCOUNTER — Encounter (HOSPITAL_COMMUNITY): Payer: Self-pay | Admitting: Ophthalmology

## 2015-07-26 DIAGNOSIS — M109 Gout, unspecified: Secondary | ICD-10-CM | POA: Diagnosis not present

## 2015-08-02 DIAGNOSIS — Z961 Presence of intraocular lens: Secondary | ICD-10-CM | POA: Diagnosis not present

## 2015-08-13 DIAGNOSIS — Z6828 Body mass index (BMI) 28.0-28.9, adult: Secondary | ICD-10-CM | POA: Diagnosis not present

## 2015-08-13 DIAGNOSIS — I10 Essential (primary) hypertension: Secondary | ICD-10-CM | POA: Diagnosis not present

## 2015-08-13 DIAGNOSIS — Z87891 Personal history of nicotine dependence: Secondary | ICD-10-CM | POA: Diagnosis not present

## 2015-08-31 DIAGNOSIS — Z87891 Personal history of nicotine dependence: Secondary | ICD-10-CM | POA: Diagnosis not present

## 2015-08-31 DIAGNOSIS — I10 Essential (primary) hypertension: Secondary | ICD-10-CM | POA: Diagnosis not present

## 2015-08-31 DIAGNOSIS — M1710 Unilateral primary osteoarthritis, unspecified knee: Secondary | ICD-10-CM | POA: Diagnosis not present

## 2015-10-21 DIAGNOSIS — M1710 Unilateral primary osteoarthritis, unspecified knee: Secondary | ICD-10-CM | POA: Diagnosis not present

## 2015-10-21 DIAGNOSIS — I10 Essential (primary) hypertension: Secondary | ICD-10-CM | POA: Diagnosis not present

## 2015-10-21 DIAGNOSIS — E7800 Pure hypercholesterolemia, unspecified: Secondary | ICD-10-CM | POA: Diagnosis not present

## 2015-11-11 DIAGNOSIS — E7800 Pure hypercholesterolemia, unspecified: Secondary | ICD-10-CM | POA: Diagnosis not present

## 2015-11-11 DIAGNOSIS — I10 Essential (primary) hypertension: Secondary | ICD-10-CM | POA: Diagnosis not present

## 2015-11-30 DIAGNOSIS — E7800 Pure hypercholesterolemia, unspecified: Secondary | ICD-10-CM | POA: Diagnosis not present

## 2015-11-30 DIAGNOSIS — I10 Essential (primary) hypertension: Secondary | ICD-10-CM | POA: Diagnosis not present

## 2016-01-06 DIAGNOSIS — E7800 Pure hypercholesterolemia, unspecified: Secondary | ICD-10-CM | POA: Diagnosis not present

## 2016-01-06 DIAGNOSIS — I10 Essential (primary) hypertension: Secondary | ICD-10-CM | POA: Diagnosis not present

## 2016-01-19 DIAGNOSIS — M109 Gout, unspecified: Secondary | ICD-10-CM | POA: Diagnosis not present

## 2016-01-19 DIAGNOSIS — Z299 Encounter for prophylactic measures, unspecified: Secondary | ICD-10-CM | POA: Diagnosis not present

## 2016-01-19 DIAGNOSIS — I10 Essential (primary) hypertension: Secondary | ICD-10-CM | POA: Diagnosis not present

## 2016-01-19 DIAGNOSIS — E7800 Pure hypercholesterolemia, unspecified: Secondary | ICD-10-CM | POA: Diagnosis not present

## 2016-02-09 DIAGNOSIS — Z299 Encounter for prophylactic measures, unspecified: Secondary | ICD-10-CM | POA: Diagnosis not present

## 2016-02-09 DIAGNOSIS — K5900 Constipation, unspecified: Secondary | ICD-10-CM | POA: Diagnosis not present

## 2016-02-24 DIAGNOSIS — E7800 Pure hypercholesterolemia, unspecified: Secondary | ICD-10-CM | POA: Diagnosis not present

## 2016-02-24 DIAGNOSIS — I10 Essential (primary) hypertension: Secondary | ICD-10-CM | POA: Diagnosis not present

## 2016-02-25 DIAGNOSIS — Z299 Encounter for prophylactic measures, unspecified: Secondary | ICD-10-CM | POA: Diagnosis not present

## 2016-02-25 DIAGNOSIS — R5383 Other fatigue: Secondary | ICD-10-CM | POA: Diagnosis not present

## 2016-02-25 DIAGNOSIS — Z0000 Encounter for general adult medical examination without abnormal findings: Secondary | ICD-10-CM | POA: Diagnosis not present

## 2016-02-25 DIAGNOSIS — Z79899 Other long term (current) drug therapy: Secondary | ICD-10-CM | POA: Diagnosis not present

## 2016-02-25 DIAGNOSIS — Z1211 Encounter for screening for malignant neoplasm of colon: Secondary | ICD-10-CM | POA: Diagnosis not present

## 2016-02-25 DIAGNOSIS — E7800 Pure hypercholesterolemia, unspecified: Secondary | ICD-10-CM | POA: Diagnosis not present

## 2016-02-25 DIAGNOSIS — Z1389 Encounter for screening for other disorder: Secondary | ICD-10-CM | POA: Diagnosis not present

## 2016-02-25 DIAGNOSIS — Z7189 Other specified counseling: Secondary | ICD-10-CM | POA: Diagnosis not present

## 2016-02-25 DIAGNOSIS — Z125 Encounter for screening for malignant neoplasm of prostate: Secondary | ICD-10-CM | POA: Diagnosis not present

## 2016-07-11 DIAGNOSIS — Z299 Encounter for prophylactic measures, unspecified: Secondary | ICD-10-CM | POA: Diagnosis not present

## 2016-07-11 DIAGNOSIS — Z713 Dietary counseling and surveillance: Secondary | ICD-10-CM | POA: Diagnosis not present

## 2016-07-11 DIAGNOSIS — I10 Essential (primary) hypertension: Secondary | ICD-10-CM | POA: Diagnosis not present

## 2016-07-11 DIAGNOSIS — Z6827 Body mass index (BMI) 27.0-27.9, adult: Secondary | ICD-10-CM | POA: Diagnosis not present

## 2016-07-11 DIAGNOSIS — M1710 Unilateral primary osteoarthritis, unspecified knee: Secondary | ICD-10-CM | POA: Diagnosis not present

## 2016-10-12 DIAGNOSIS — H26493 Other secondary cataract, bilateral: Secondary | ICD-10-CM | POA: Diagnosis not present

## 2016-12-15 DIAGNOSIS — M109 Gout, unspecified: Secondary | ICD-10-CM | POA: Diagnosis not present

## 2017-02-17 DIAGNOSIS — D649 Anemia, unspecified: Secondary | ICD-10-CM | POA: Diagnosis not present

## 2017-02-17 DIAGNOSIS — Z79899 Other long term (current) drug therapy: Secondary | ICD-10-CM | POA: Diagnosis not present

## 2017-02-19 DIAGNOSIS — Z79899 Other long term (current) drug therapy: Secondary | ICD-10-CM | POA: Diagnosis not present

## 2017-02-19 DIAGNOSIS — E119 Type 2 diabetes mellitus without complications: Secondary | ICD-10-CM | POA: Diagnosis not present

## 2017-02-19 DIAGNOSIS — E559 Vitamin D deficiency, unspecified: Secondary | ICD-10-CM | POA: Diagnosis not present

## 2017-02-19 DIAGNOSIS — D649 Anemia, unspecified: Secondary | ICD-10-CM | POA: Diagnosis not present

## 2017-02-19 DIAGNOSIS — E039 Hypothyroidism, unspecified: Secondary | ICD-10-CM | POA: Diagnosis not present

## 2017-02-19 DIAGNOSIS — E785 Hyperlipidemia, unspecified: Secondary | ICD-10-CM | POA: Diagnosis not present

## 2017-03-05 DIAGNOSIS — Z79899 Other long term (current) drug therapy: Secondary | ICD-10-CM | POA: Diagnosis not present

## 2017-03-05 DIAGNOSIS — D649 Anemia, unspecified: Secondary | ICD-10-CM | POA: Diagnosis not present

## 2017-04-18 ENCOUNTER — Encounter: Payer: Self-pay | Admitting: Neurology

## 2017-04-18 ENCOUNTER — Ambulatory Visit (INDEPENDENT_AMBULATORY_CARE_PROVIDER_SITE_OTHER): Payer: Medicare Other | Admitting: Neurology

## 2017-04-18 VITALS — BP 116/66 | HR 57 | Ht 72.0 in | Wt 186.4 lb

## 2017-04-18 DIAGNOSIS — I671 Cerebral aneurysm, nonruptured: Secondary | ICD-10-CM | POA: Diagnosis not present

## 2017-04-18 DIAGNOSIS — I609 Nontraumatic subarachnoid hemorrhage, unspecified: Secondary | ICD-10-CM | POA: Diagnosis not present

## 2017-04-18 DIAGNOSIS — I639 Cerebral infarction, unspecified: Secondary | ICD-10-CM | POA: Insufficient documentation

## 2017-04-18 DIAGNOSIS — I607 Nontraumatic subarachnoid hemorrhage from unspecified intracranial artery: Secondary | ICD-10-CM

## 2017-04-18 DIAGNOSIS — E785 Hyperlipidemia, unspecified: Secondary | ICD-10-CM | POA: Diagnosis not present

## 2017-04-18 DIAGNOSIS — I6389 Other cerebral infarction: Secondary | ICD-10-CM

## 2017-04-18 NOTE — Patient Instructions (Addendum)
-   continue ASA 81mg   - will do MRI and MRI angiogram to evaluate stroke and aneurysm after surgery - will refer to neurosurgery for follow up  - will request medical record from hospitals in [place] and [facility] [place] - will draw blood to rule out high cholesterol or diabetes.  - Follow up with your primary care physician for stroke risk factor modification. Recommend maintain blood pressure goal <130/80, diabetes with hemoglobin A1c goal below 7.0% and lipids with LDL cholesterol goal below 70 mg/dL.  - continue home PT/OT - quit chewing tobacco.  - healthy diet and regular exercise  - follow up in 3 months.

## 2017-04-18 NOTE — Progress Notes (Signed)
NEUROLOGY CLINIC NEW PATIENT NOTE  NAME: Richard Mcpherson DOB: 1940/08/19 REFERRING PHYSICIAN: Kirstie Peri, MD  I saw Richard Mcpherson as a new consult in the neurovascular clinic today regarding  Chief Complaint  Patient presents with  . New Patient (Initial Visit)    Pt had a subarachoid hemorrhage and surgery in [facility]  .  HPI: Richard Mcpherson is a 76 y.o. male with PMH of gout, HLD, ACOM aneurysm with SAH s/p coiling who presents as a new patient for aneurysm rupture s/p coiling and delayed ischemia.   Pt stated that on 01/20/17 he had sudden onset right leg weakness during a shower, he was not able to lift left leg and had to use hand to help to get out of the shower. his right leg gave away and he crumpled to the floor. Did not strike his head. Did not have loss of consciousness. Shortly after, he had onset of a diffuse severe headache over his entire head. He lay on the floor for around 5 minutes and then the headache started to resolve and he was able to get up and around. The headache never completely went away but was very mild. He went to see PCP on 01/22/17, was told to have TIA. An scheduled CT the week after. However, patient had a trip to go so he went to [place], [place] on 01/24/17, however, his HA started getting worse again and cannot sleep with nausea but no vomiting. He went to [facility] in [place] suspicious for a SAH at anterior suprasellar cistern and anterior interhemispheric fissure. 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 he was transferred to [facility] in [place], [place], where he received aneurysm coiling on 01/26/17. As per wife, initially he had a no right-sided weakness, however after day 3, patient started to have right-sided weakness. As per report 01/29/17, TCD showed right M1 120, left M1 95, however on 02/07/17, right M1 116, and left M1 176,  indicating left MCA vasospasm. As per wife patient had cerebral angiogram with injecting of medication directly to the arteries (imaging not available), however not able to tell the medication name. However, she said patient was on the nimodipine for 21 days. After 3 weeks of hospitalization, patient was sent to rehabilitation in [place], and then home PT/OT. Currently, patient is to have home PT/OT, right lower extremity muscle strengths much improved, nearly normal, however right hand still has mild dexterity difficulties. On aspirin 81 mg. Follow up with PCP Dr. Sherryll Burger on 03/14/17, referred here for further follow up.   As per wife, patient has no history of hypertension or diabetes, but does have history of the hyperlipidemia, intolerance to Lipitor and Crestor. He has quit smoking 20 years ago, but continues to chew tobacco.  Past Medical History:  Diagnosis Date  . Atrial premature beats   . Blood in stool   . Cerebral aneurysm   . Coronary atherosclerosis of native coronary artery   . Embolism (HCC)    History of right lower extremity distal embolism   . Gout   . Hyperglycemia   . Hyperlipidemia, mixed   . Osteoarthritis   . Plantar fasciitis   . Sinusitis   . Stroke (HCC)   . Subarachnoid hemorrhage (HCC)   . Unspecified essential hypertension    Past Surgical History:  Procedure Laterality Date  . APPENDECTOMY    .  cardiac catherization    . CATARACT EXTRACTION W/PHACO Right 02/19/2014   Procedure: CATARACT EXTRACTION PHACO AND INTRAOCULAR LENS PLACEMENT (IOC);  Surgeon: Gemma Payor, MD;  Location: [facility];  Service: Ophthalmology;  Laterality: Right;  CDE 12.71  . CATARACT EXTRACTION W/PHACO Left 12/28/2014   Procedure: CATARACT EXTRACTION PHACO AND INTRAOCULAR LENS PLACEMENT (IOC);  Surgeon: Gemma Payor, MD;  Location: [facility];  Service: Ophthalmology;  Laterality: Left;  CDE:8.05  . embolectomy 2000     Family History  Problem Relation Age of Onset  . Cancer Mother    Current  Outpatient Prescriptions  Medication Sig Dispense Refill  . aspirin EC 81 MG tablet Take 81 mg by mouth daily.    Marland Kitchen atenolol-chlorthalidone (TENORETIC) 50-25 MG per tablet Take 1 tablet by mouth daily.    . chlorthalidone (HYGROTON) 50 MG tablet Take by mouth.    . diclofenac (VOLTAREN) 75 MG EC tablet Take 75 mg by mouth 2 (two) times daily as needed.  6  . Misc Natural Products (TART CHERRY ADVANCED) CAPS Take by mouth.    . Naproxen Sodium (ALEVE PO) Take by mouth.    Marland Kitchen OVER THE COUNTER MEDICATION     . psyllium (METAMUCIL) 58.6 % powder Take 1 packet by mouth 3 (three) times daily.     No current facility-administered medications for this visit.    Allergies  Allergen Reactions  . Codeine Nausea Only and Other (See Comments)  . Allopurinol     rash  . Crestor [Rosuvastatin Calcium]     bodyache  . Lipitor [Atorvastatin]     Body aches   Social History   Social History  . Marital status: Married    Spouse name: N/A  . Number of children: N/A  . Years of education: N/A   Occupational History  . retired    Social History Main Topics  . Smoking status: Former Smoker    Packs/day: 1.00    Years: 40.00  . Smokeless tobacco: Current User    Types: Snuff  . Alcohol use Yes     Comment: 1-2 beers every other day   . Drug use: No  . Sexual activity: Not on file   Other Topics Concern  . Not on file   Social History Narrative  . No narrative on file    Review of Systems Full 14 system review of systems performed and notable only for those listed, all others are neg:  Constitutional:  Weight loss Cardiovascular:  Ear/Nose/Throat:   Skin:  Eyes:   Respiratory:   Gastroitestinal:   Genitourinary:  Hematology/Lymphatic:   Endocrine:  Musculoskeletal:   Allergy/Immunology:   Neurological:  Confusion, weakness Psychiatric:  Sleep:    Physical Exam  Vitals:   04/18/17 0906  BP: 116/66  Pulse: (!) 57    General - Well nourished, well developed, in no  apparent distress.  Ophthalmologic - Sharp disc margins OU.  Cardiovascular - Regular rate and rhythm with no murmur. Carotid pulses were 2+ without bruits .   Neck - supple, no nuchal rigidity .  Mental Status -  Level of arousal and orientation to time, place, and person were intact. Language including expression, naming, repetition, comprehension, reading, and writing was assessed and found intact. Attention span and concentration were normal. Fund of Knowledge was assessed and was intact.  Cranial Nerves II - XII - II - Visual field intact OU. III, IV, VI - Extraocular movements intact. V - Facial sensation intact bilaterally. VII - Facial  movement intact bilaterally. VIII - Hearing & vestibular intact bilaterally. X - Palate elevates symmetrically. XI - Chin turning & shoulder shrug intact bilaterally. XII - Tongue protrusion intact.  Motor Strength - The patient's strength was normal in all extremities and pronator drift was absent, except right hand mild dexterity difficulties.  Bulk was normal and fasciculations were absent.   Motor Tone - Muscle tone was assessed at the neck and appendages and was normal.  Reflexes - The patient's reflexes were normal in all extremities and he had no pathological reflexes.  Sensory - Light touch, temperature/pinprick, vibration and proprioception, and Romberg testing were assessed and were normal.    Coordination - The patient had normal movements in the hands and feet with no ataxia or dysmetria.  Tremor was absent.  Gait and Station - very subtle right hemiparetic gait   Imaging  No imaging available at this time  01/25/17 CT and CTA head and neck FINDINGS:  Noncontrast CT showed suspicion of a subarachnoid hemorrhage, anterior suprasellar cistern and anterior interhemispheric fissure.  CTA shows a berry aneurysm of the anterior communicate artery, projecting caudally beneath the AComm, measuring approximately 4.5 mm in  diameter.  No other intracranial arterial pathology.  Moderate atheromatous calcific plaquing involves the distal common carotid arteries, carotid bifurcations and origins of the internal carotid arteries. There is mild stenosis of the origin/proximal left ICA, approximately 30%. No significant right  carotid stenosis.  The aortic arch is left-sided and shows moderate atheromatous plaquing. No arch aneurysm or dissection.  Proximal great brachiocephalic arteries are negative.  No significant coincidental findings.  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  01/29/17 TCD - right extracranial ICA 58, intracranial ICA 52, ACA 51, M1 120, M2 98. Left intracranial ICA 63, intracranial ICA 134, ACA 79, M1 95, M2 68. Right VA 40, left VA 47, right at P1 38 P2 58, left P1 54 P2 64  Impression: no intracranial vasospasm  02/07/17 TCD - right intracranial ICA 62.5, intracranial ICA 67.2, ACA 68.3, M1 116.1, M2 106.38. Left extracranial ICA 94.4, intracranial ICA 90.4, ACA 152.8, M1 176.1, M2 78. Right VA, left today, BA, not able to obtain. Right at P1 65.7, right P2 54.8, left P1 85.6 and left P2 79.1 Impression: Velocities suggest mild spasm in the left ACA and MCA. Velocity have slowly increased over the last several examinations. Please correlate with clinical signs/symptoms of vasospasm    Lab Review none    Assessment and Plan:   In summary, Richard Mcpherson is a 76 y.o. male with PMH of HDL and gout presents for follow up with [facility] due to [facility] aneurysm s/p coiling in [facility]. As per note, he seemed to have delayed ischemia at left ACA and MCA causing mild right-sided weakness. All the medical director has been requested. Currently only has mild right hand dexterity difficulties and subtle right hemiparetic gait. Will finish work up with MRI, MRA, LDL, A1c. Continue aspirin and referral to neurosurgery for follow-up. Encouraged to quit tobacco  chewing.  - continue ASA   - will do MRI and MRI angiogram to evaluate stroke and aneurysm after surgery - will refer to neurosurgery for follow up  - will request medical record from hospitals in [place] and [facility] [place] - will draw blood to rule out high cholesterol or diabetes.  - Follow up with your primary care physician for stroke risk factor modification. Recommend maintain blood pressure goal <130/80, diabetes  with hemoglobin A1c goal below 7.0% and lipids with LDL cholesterol goal below 70 mg/dL.  - continue home PT/OT - quit chewing tobacco.  - healthy diet and regular exercise  - follow up in 3 months.    I recommend aggressive blood pressure control with a goal <130/80 mm Hg.  Lipids should be managed intensively, with a goal LDL < 70 mg/dL.  I encouraged the patient to discuss these important issues with his primary care physician.  I counseled the patient on measures to reduce stroke risk, including the importance of medication compliance, risk factor control, exercise, healthy diet, and avoidance of smoking.  I reviewed stroke warning signs and symptoms and appropriate actions to take if such occurs.   Thank you very much for the opportunity to participate in the care of this patient.  Please do not hesitate to call if any questions or concerns arise.  Orders Placed This Encounter  Procedures  . [name]    Standing Status:   Future    Standing Expiration Date:   06/18/2018    Order Specific Question:   What is the patient's sedation requirement?    Answer:   No Sedation    Order Specific Question:   Does the patient have a pacemaker or implanted devices?    Answer:   Yes    Comments:   cerebral aneurysm coiling    Order Specific Question:   Preferred imaging location?    Answer:   [facility] (table limit-350lbs)    Order Specific Question:   Radiology Contrast Protocol - do NOT remove file path    Answer:    \\[email or web address]\[email or web address]  . MR MRA HEAD WO CONTRAST    Standing Status:   Future    Standing Expiration Date:   06/18/2018    Order Specific Question:   What is the patient's sedation requirement?    Answer:   No Sedation    Order Specific Question:   Does the patient have a pacemaker or implanted devices?    Answer:   Yes    Comments:   cerebral aneurysm coiling    Order Specific Question:   Preferred imaging location?    Answer:   [facility] (table limit-350lbs)    Order Specific Question:   Radiology Contrast Protocol - do NOT remove file path    Answer:   \\[email or web address]\Radiant\[email or web address].PDF  . Lipid panel  . Hemoglobin A1c  . CBC (no diff)  . CMP  . Ambulatory referral to Neurosurgery    Referral Priority:   Routine    Referral Type:   Surgical    Referral Reason:   Specialty Services Required    Referred to Provider:   Lisbeth Renshaw, MD    Requested Specialty:   Neurosurgery    Number of Visits Requested:   1    Meds ordered this encounter  Medications  . DISCONTD: clonazePAM (KLONOPIN) 0.5 MG tablet    Sig: Take 0.5 mg by mouth daily as needed.    Refill:  0  . DISCONTD: rosuvastatin (CRESTOR) 5 MG tablet    Sig: Take 5 mg by mouth daily.    Refill:  1  . diclofenac (VOLTAREN) 75 MG EC tablet    Sig: Take 75 mg by mouth 2 (two) times daily as needed.    Refill:  6  . DISCONTD: atenolol (TENORMIN) 50 MG tablet    Sig: TAKE 1 TABLET BY MOUTH EVERY DAY WITH CHLORTHALIDONE  Refill:  3  . chlorthalidone (HYGROTON) 50 MG tablet    Sig: Take by mouth.  . DISCONTD: Omega-3 Fatty Acids (FISH OIL) 1000 MG CAPS    Sig: Take by mouth.  . Misc Natural Products (TART CHERRY ADVANCED) CAPS    Sig: Take by mouth.  Marland Kitchen OVER THE COUNTER MEDICATION  . Naproxen Sodium (ALEVE PO)    Sig: Take by mouth.  . psyllium (METAMUCIL) 58.6 % powder    Sig: Take 1 packet by mouth 3 (three) times daily.    Patient Instructions  - continue ASA    - will do MRI and MRI angiogram to evaluate stroke and aneurysm after surgery - will refer to neurosurgery for follow up  - will request medical record from hospitals in [place] and [facility] [place] - will draw blood to rule out high cholesterol or diabetes.  - Follow up with your primary care physician for stroke risk factor modification. Recommend maintain blood pressure goal <130/80, diabetes with hemoglobin A1c goal below 7.0% and lipids with LDL cholesterol goal below 70 mg/dL.  - continue home PT/OT - quit chewing tobacco.  - healthy diet and regular exercise  - follow up in 3 months.    Marvel Plan, MD PhD [facility] [address] [place], [place] [address] [phone]

## 2017-04-19 LAB — COMPREHENSIVE METABOLIC PANEL
ALK PHOS: 95 IU/L (ref 39–117)
ALT: 21 IU/L (ref 0–44)
AST: 21 IU/L (ref 0–40)
Albumin/Globulin Ratio: 1.6 (ref 1.2–2.2)
Albumin: 4.3 g/dL (ref 3.5–4.8)
BUN/Creatinine Ratio: 26 — ABNORMAL HIGH (ref 10–24)
BUN: 24 mg/dL (ref 8–27)
Bilirubin Total: 0.5 mg/dL (ref 0.0–1.2)
CO2: 25 mmol/L (ref 20–29)
CREATININE: 0.93 mg/dL (ref 0.76–1.27)
Calcium: 9.8 mg/dL (ref 8.6–10.2)
Chloride: 101 mmol/L (ref 96–106)
GFR calc Af Amer: 91 mL/min/{1.73_m2} (ref >59)
GFR calc non Af Amer: 79 mL/min/{1.73_m2} (ref >59)
GLUCOSE: 86 mg/dL (ref 65–99)
Globulin, Total: 2.7 g/dL (ref 1.5–4.5)
Potassium: 3.5 mmol/L (ref 3.5–5.2)
Sodium: 142 mmol/L (ref 134–144)
TOTAL PROTEIN: 7 g/dL (ref 6.0–8.5)

## 2017-04-19 LAB — CBC
Hematocrit: 47.3 % (ref 37.5–51.0)
Hemoglobin: 15.9 g/dL (ref 13.0–17.7)
MCH: 28.5 pg (ref 26.6–33.0)
MCHC: 33.6 g/dL (ref 31.5–35.7)
MCV: 85 fL (ref 79–97)
PLATELETS: 210 10*3/uL (ref 150–379)
RBC: 5.57 x10E6/uL (ref 4.14–5.80)
RDW: 14.8 % (ref 12.3–15.4)
WBC: 8 10*3/uL (ref 3.4–10.8)

## 2017-04-19 LAB — LIPID PANEL
CHOL/HDL RATIO: 6.8 ratio — AB (ref 0.0–5.0)
Cholesterol, Total: 260 mg/dL — ABNORMAL HIGH (ref 100–199)
HDL: 38 mg/dL — AB (ref >39)
LDL CALC: 182 mg/dL — AB (ref 0–99)
TRIGLYCERIDES: 200 mg/dL — AB (ref 0–149)
VLDL Cholesterol Cal: 40 mg/dL (ref 5–40)

## 2017-04-19 LAB — HEMOGLOBIN A1C
Est. average glucose Bld gHb Est-mCnc: 108 mg/dL
Hgb A1c MFr Bld: 5.4 % (ref 4.8–5.6)

## 2017-04-23 ENCOUNTER — Telehealth: Payer: Self-pay

## 2017-04-23 NOTE — Telephone Encounter (Signed)
Left vm for patients wife on dpr to call back about lab work results. ------

## 2017-04-23 NOTE — Telephone Encounter (Signed)
-----   Message from Jindong Xu, MD sent at 04/19/2017 11:14 AM EDT ----- Could you please let the patient know that the cholesterol test done yesterday in our office still showed high cholesterol level. Although he was not fasting, he has hx of hyerlipidemia and the number still high. He has intolerance with crestor and lipitor in chart, so he needs to discuss with PCP Dr. Shah to consider injectable medications. Thanks.  Jindong Xu, MD PhD Stroke Neurology 04/19/2017 11:14 AM   

## 2017-04-24 ENCOUNTER — Telehealth: Payer: Self-pay | Admitting: Neurology

## 2017-04-24 ENCOUNTER — Ambulatory Visit (HOSPITAL_COMMUNITY): Payer: Medicare Other

## 2017-04-24 ENCOUNTER — Ambulatory Visit (HOSPITAL_COMMUNITY)
Admission: RE | Admit: 2017-04-24 | Discharge: 2017-04-24 | Disposition: A | Discharge status: Home / Self Care | Payer: Medicare Other | Source: Ambulatory Visit | Attending: Neurology | Admitting: Neurology

## 2017-04-24 DIAGNOSIS — I6389 Other cerebral infarction: Secondary | ICD-10-CM

## 2017-04-24 DIAGNOSIS — I607 Nontraumatic subarachnoid hemorrhage from unspecified intracranial artery: Secondary | ICD-10-CM

## 2017-04-24 NOTE — Telephone Encounter (Signed)
RN receive call from [place] at [facility] that patients MRI has to be reschedule. PT had a aneurysm that was coil, and pt is unsure of the type, make, model number. PT did not have a card. Rn stated a request was fax for a copy of the CD to be mailed on 04/18/2017. Rn resent and fax  the release form to [employer] to request operative, radiology,procedure note to [phone].Message will be sent to Dr. Roda Shutters.

## 2017-04-24 NOTE — Telephone Encounter (Signed)
The request was fax stat at 218pm for the procedure, operative notes on his aneurysm coiling procedure.

## 2017-04-24 NOTE — Telephone Encounter (Signed)
Richard Mcpherson at [facility] MRI is calling. He needs to know what kind of aneurism coil the [place].

## 2017-04-24 NOTE — Telephone Encounter (Signed)
Dr. Paulette Blanch. It has been fax as of today for the report. The CD report was fax 04/18/2017.

## 2017-04-25 ENCOUNTER — Ambulatory Visit (HOSPITAL_COMMUNITY)
Admission: RE | Admit: 2017-04-25 | Discharge: 2017-04-25 | Disposition: A | Discharge status: Home / Self Care | Payer: Medicare Other | Source: Ambulatory Visit | Attending: Neurology | Admitting: Neurology

## 2017-04-25 ENCOUNTER — Telehealth: Payer: Self-pay | Admitting: *Deleted

## 2017-04-25 DIAGNOSIS — I609 Nontraumatic subarachnoid hemorrhage, unspecified: Secondary | ICD-10-CM | POA: Insufficient documentation

## 2017-04-25 DIAGNOSIS — I6389 Other cerebral infarction: Secondary | ICD-10-CM | POA: Diagnosis not present

## 2017-04-25 DIAGNOSIS — I671 Cerebral aneurysm, nonruptured: Secondary | ICD-10-CM | POA: Diagnosis not present

## 2017-04-25 DIAGNOSIS — I739 Peripheral vascular disease, unspecified: Secondary | ICD-10-CM | POA: Insufficient documentation

## 2017-04-25 NOTE — Telephone Encounter (Signed)
Rn call patients wife that her husbands cholesterol done yesterday in our office still showed high cholesterol level. Although he was not fasting, he has hx of hyerlipidemia and the number still high. He has intolerance with crestor and lipitor in chart, so he needs to discuss with PCP Dr. Sherryll Burger to consider injectable medications. Pts wife verbalized understanding.Richard Mcpherson   ------

## 2017-04-25 NOTE — Telephone Encounter (Addendum)
Rn call Shane at [phone]. Richard Mcpherson is in MRI at [facility].He gave fax number of [phone]. Rn fax the 7 page report to him.Report was fax and confirmed. Richard Mcpherson stated once he looks over the report if its okay he will call the patient to reschedule.

## 2017-04-25 NOTE — Telephone Encounter (Signed)
Rn call Richard Mcpherson medical records that the request for records and surgical procedures is over 45 pages. The fax will take a couple of hours to be deliver. It was fax today at 0935am.

## 2017-04-25 NOTE — Telephone Encounter (Signed)
Revised. 

## 2017-04-25 NOTE — Telephone Encounter (Signed)
IF patients wife call back Jeani Hawking has report of procedure. They will call her husband to reschedule, or she can the MRI.

## 2017-04-25 NOTE — Telephone Encounter (Signed)
Thanks a lot for the effort. Appreciated.  Marvel Plan, MD PhD Stroke Neurology 04/25/2017 6:24 PM

## 2017-04-25 NOTE — Telephone Encounter (Signed)
-----   Message from Marvel Plan, MD sent at 04/19/2017 11:14 AM EDT ----- Could you please let the patient know that the cholesterol test done yesterday in our office still showed high cholesterol level. Although he was not fasting, he has hx of hyerlipidemia and the number still high. He has intolerance with crestor and lipitor in chart, so he needs to discuss with PCP Dr. Sherryll Burger to consider injectable medications. Thanks.  Marvel Plan, MD PhD Stroke Neurology 04/19/2017 11:14 AM

## 2017-04-25 NOTE — Telephone Encounter (Addendum)
2nd request:  RN call [facility] in [facility] Rn stated the stat request was not fax to our office. Rn stated 2 request were sent yesterday. The medical records rep stated do we accept electronically records via email. RN stated we only accept faxes manually via machine. They stated it will be fax again to [phone].

## 2017-04-25 NOTE — Telephone Encounter (Signed)
Pt [facility] CD is on [employer].

## 2017-04-25 NOTE — Telephone Encounter (Signed)
Rn receive fax of over 45 pages of patients coil procedure of aneurysm.

## 2017-04-30 ENCOUNTER — Telehealth: Payer: Self-pay

## 2017-04-30 NOTE — Telephone Encounter (Signed)
-----   Message from Marvel Plan, MD sent at 04/29/2017  5:13 PM EDT ----- Talked with Richard Mcpherson and wife over the phone. They stated that they have appointment with Dr. Conchita Paris tomorrow. I ask them to mention the MRA and MRI he had recently and let Dr. Conchita Paris take a look of the images. 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 However, he denies any hx of stroke. Anyway, the treatment plan would not change. Continue ASA. Richard Mcpherson will have appointment with Dr. Sherryll Burger end of this month and they will discuss with Dr. Sherryll Burger about injectable cholesterol lowering medications. They expressed understanding and appreciation.   Marvel Plan, MD PhD Stroke Neurology 04/29/2017 5:13 PM

## 2017-04-30 NOTE — Telephone Encounter (Signed)
Dr. Roda Shutters has already discussed results with patient.

## 2017-05-01 ENCOUNTER — Encounter: Payer: Self-pay | Admitting: Neurology

## 2017-05-01 NOTE — Progress Notes (Signed)
Received the documentation from [facility] in [place].  UE venous Doppler 02/09/17 There is evidence for occlusive thrombus within the right cephalic vein (superficial vein)  LE venous Doppler 02/08/17 No DVT of either lower extremity as requested.  CTA head 7/26 was rating No evidence of acute cortical infarct, hemorrhage or mass. Postsurgical changes related to prior coiling embolization of anterior communicating artery aneurysm No evidence of vasospasm in the anterior or posterior circulation  CTA head 02/06/17 Postsurgical changes related to prior coiling embolization of anterior communicating artery aneurysm No evidence of acute cortical infarction, hemorrhage or mass Postsurgical changes related to prior corneal embolization of anterior communicating artery aneurysm without obvious evidence of residual or recurrent aneurysm No evidence of vasospasm in the anterior or posterior circulation  CTA head 01/31/17 No acute intracranial abnormalities Slightly diminutive appearance of the proximal ACAs and MCAs, could represent vasospasm. No large vessel occlusions.  Cerebral angiogram 02/07/17 No significant vasospasm in the anterior or posterior circulation Postsurgical changes related to prior coiling embolization of anterior communicating artery aneurysm without evidence of residual or recurrent feeling among the interstices of the coil construct. There does appear to be mild residual feeling at the base of the aneurysm, and change from prior examination  Cerebral angiogram 01/31/17 Minimal vasospasm of the bilateral proximal anterior cerebral arteries with 10 mg verapamil given into each of the ICAs No significant residual feeling of the anterior communicating artery aneurysm  Cerebral angiogram 01/26/17 Ruptured anterior making artery aneurysm, measuring 2.535 of the 2.3 mm neck Successful coil embolization of the aneurysm using a single platinum coil and the  single microcatheter technique resulting in the occlusion of the 2 of the aneurysm and only mild residual feeling at the base of the aneurysm amongst the interstices of the coil construct.

## 2017-06-29 DIAGNOSIS — R1310 Dysphagia, unspecified: Secondary | ICD-10-CM | POA: Diagnosis not present

## 2017-06-29 DIAGNOSIS — S14101A Unspecified injury at C1 level of cervical spinal cord, initial encounter: Secondary | ICD-10-CM | POA: Diagnosis not present

## 2017-06-29 DIAGNOSIS — D649 Anemia, unspecified: Secondary | ICD-10-CM | POA: Diagnosis not present

## 2017-06-29 DIAGNOSIS — J181 Lobar pneumonia, unspecified organism: Secondary | ICD-10-CM | POA: Diagnosis not present

## 2017-06-29 DIAGNOSIS — R5081 Fever presenting with conditions classified elsewhere: Secondary | ICD-10-CM | POA: Diagnosis not present

## 2017-06-29 DIAGNOSIS — I469 Cardiac arrest, cause unspecified: Secondary | ICD-10-CM | POA: Diagnosis not present

## 2017-06-29 DIAGNOSIS — R509 Fever, unspecified: Secondary | ICD-10-CM | POA: Diagnosis not present

## 2017-06-29 DIAGNOSIS — M4312 Spondylolisthesis, cervical region: Secondary | ICD-10-CM | POA: Diagnosis not present

## 2017-06-29 DIAGNOSIS — Z79899 Other long term (current) drug therapy: Secondary | ICD-10-CM | POA: Diagnosis not present

## 2017-06-29 DIAGNOSIS — M4802 Spinal stenosis, cervical region: Secondary | ICD-10-CM | POA: Diagnosis not present

## 2017-06-29 DIAGNOSIS — R0603 Acute respiratory distress: Secondary | ICD-10-CM | POA: Diagnosis not present

## 2017-06-29 DIAGNOSIS — R451 Restlessness and agitation: Secondary | ICD-10-CM | POA: Diagnosis not present

## 2017-06-29 DIAGNOSIS — I700 Atherosclerosis of aorta: Secondary | ICD-10-CM | POA: Diagnosis not present

## 2017-06-29 DIAGNOSIS — Z4682 Encounter for fitting and adjustment of non-vascular catheter: Secondary | ICD-10-CM | POA: Diagnosis not present

## 2017-06-29 DIAGNOSIS — I671 Cerebral aneurysm, nonruptured: Secondary | ICD-10-CM | POA: Diagnosis not present

## 2017-06-29 DIAGNOSIS — S0990XA Unspecified injury of head, initial encounter: Secondary | ICD-10-CM | POA: Diagnosis not present

## 2017-06-29 DIAGNOSIS — I618 Other nontraumatic intracerebral hemorrhage: Secondary | ICD-10-CM | POA: Diagnosis not present

## 2017-06-29 DIAGNOSIS — I69191 Dysphagia following nontraumatic intracerebral hemorrhage: Secondary | ICD-10-CM | POA: Diagnosis not present

## 2017-06-29 DIAGNOSIS — R404 Transient alteration of awareness: Secondary | ICD-10-CM | POA: Diagnosis not present

## 2017-06-29 DIAGNOSIS — R29707 NIHSS score 7: Secondary | ICD-10-CM | POA: Diagnosis not present

## 2017-06-29 DIAGNOSIS — R9401 Abnormal electroencephalogram [EEG]: Secondary | ICD-10-CM | POA: Diagnosis not present

## 2017-06-29 DIAGNOSIS — J90 Pleural effusion, not elsewhere classified: Secondary | ICD-10-CM | POA: Diagnosis not present

## 2017-06-29 DIAGNOSIS — I358 Other nonrheumatic aortic valve disorders: Secondary | ICD-10-CM | POA: Diagnosis not present

## 2017-06-29 DIAGNOSIS — Z87891 Personal history of nicotine dependence: Secondary | ICD-10-CM | POA: Diagnosis not present

## 2017-06-29 DIAGNOSIS — G8101 Flaccid hemiplegia affecting right dominant side: Secondary | ICD-10-CM | POA: Diagnosis not present

## 2017-06-29 DIAGNOSIS — I609 Nontraumatic subarachnoid hemorrhage, unspecified: Secondary | ICD-10-CM | POA: Diagnosis not present

## 2017-06-29 DIAGNOSIS — J9600 Acute respiratory failure, unspecified whether with hypoxia or hypercapnia: Secondary | ICD-10-CM | POA: Diagnosis not present

## 2017-06-29 DIAGNOSIS — J189 Pneumonia, unspecified organism: Secondary | ICD-10-CM | POA: Diagnosis not present

## 2017-06-29 DIAGNOSIS — Z9911 Dependence on respirator [ventilator] status: Secondary | ICD-10-CM | POA: Diagnosis not present

## 2017-06-29 DIAGNOSIS — J984 Other disorders of lung: Secondary | ICD-10-CM | POA: Diagnosis not present

## 2017-06-29 DIAGNOSIS — R4781 Slurred speech: Secondary | ICD-10-CM | POA: Diagnosis not present

## 2017-06-29 DIAGNOSIS — I69391 Dysphagia following cerebral infarction: Secondary | ICD-10-CM | POA: Diagnosis not present

## 2017-06-29 DIAGNOSIS — I63522 Cerebral infarction due to unspecified occlusion or stenosis of left anterior cerebral artery: Secondary | ICD-10-CM | POA: Diagnosis not present

## 2017-06-29 DIAGNOSIS — I63412 Cerebral infarction due to embolism of left middle cerebral artery: Secondary | ICD-10-CM | POA: Diagnosis not present

## 2017-06-29 DIAGNOSIS — I808 Phlebitis and thrombophlebitis of other sites: Secondary | ICD-10-CM | POA: Diagnosis not present

## 2017-06-29 DIAGNOSIS — I639 Cerebral infarction, unspecified: Secondary | ICD-10-CM | POA: Diagnosis not present

## 2017-06-29 DIAGNOSIS — M5030 Other cervical disc degeneration, unspecified cervical region: Secondary | ICD-10-CM | POA: Diagnosis not present

## 2017-06-29 DIAGNOSIS — J95821 Acute postprocedural respiratory failure: Secondary | ICD-10-CM | POA: Diagnosis not present

## 2017-06-29 DIAGNOSIS — I615 Nontraumatic intracerebral hemorrhage, intraventricular: Secondary | ICD-10-CM | POA: Diagnosis not present

## 2017-06-29 DIAGNOSIS — Z8674 Personal history of sudden cardiac arrest: Secondary | ICD-10-CM | POA: Diagnosis not present

## 2017-06-29 DIAGNOSIS — I6523 Occlusion and stenosis of bilateral carotid arteries: Secondary | ICD-10-CM | POA: Diagnosis not present

## 2017-06-29 DIAGNOSIS — M47892 Other spondylosis, cervical region: Secondary | ICD-10-CM | POA: Diagnosis not present

## 2017-06-29 DIAGNOSIS — T82868A Thrombosis of vascular prosthetic devices, implants and grafts, initial encounter: Secondary | ICD-10-CM | POA: Diagnosis not present

## 2017-06-29 DIAGNOSIS — Z981 Arthrodesis status: Secondary | ICD-10-CM | POA: Diagnosis not present

## 2017-06-29 DIAGNOSIS — I63519 Cerebral infarction due to unspecified occlusion or stenosis of unspecified middle cerebral artery: Secondary | ICD-10-CM | POA: Diagnosis not present

## 2017-06-29 DIAGNOSIS — Z9282 Status post administration of tPA (rtPA) in a different facility within the last 24 hours prior to admission to current facility: Secondary | ICD-10-CM | POA: Diagnosis not present

## 2017-06-29 DIAGNOSIS — R001 Bradycardia, unspecified: Secondary | ICD-10-CM | POA: Diagnosis not present

## 2017-06-29 DIAGNOSIS — J9811 Atelectasis: Secondary | ICD-10-CM | POA: Diagnosis not present

## 2017-06-29 DIAGNOSIS — Z8673 Personal history of transient ischemic attack (TIA), and cerebral infarction without residual deficits: Secondary | ICD-10-CM | POA: Diagnosis not present

## 2017-06-29 DIAGNOSIS — I82621 Acute embolism and thrombosis of deep veins of right upper extremity: Secondary | ICD-10-CM | POA: Diagnosis not present

## 2017-06-29 DIAGNOSIS — K148 Other diseases of tongue: Secondary | ICD-10-CM | POA: Diagnosis not present

## 2017-06-29 DIAGNOSIS — I498 Other specified cardiac arrhythmias: Secondary | ICD-10-CM | POA: Diagnosis not present

## 2017-06-29 DIAGNOSIS — I82A11 Acute embolism and thrombosis of right axillary vein: Secondary | ICD-10-CM | POA: Diagnosis not present

## 2017-06-29 DIAGNOSIS — E785 Hyperlipidemia, unspecified: Secondary | ICD-10-CM | POA: Diagnosis not present

## 2017-06-29 DIAGNOSIS — I519 Heart disease, unspecified: Secondary | ICD-10-CM | POA: Diagnosis not present

## 2017-06-29 DIAGNOSIS — M542 Cervicalgia: Secondary | ICD-10-CM | POA: Diagnosis not present

## 2017-06-29 DIAGNOSIS — I6200 Nontraumatic subdural hemorrhage, unspecified: Secondary | ICD-10-CM | POA: Diagnosis not present

## 2017-06-29 DIAGNOSIS — R918 Other nonspecific abnormal finding of lung field: Secondary | ICD-10-CM | POA: Diagnosis not present

## 2017-06-29 DIAGNOSIS — R0989 Other specified symptoms and signs involving the circulatory and respiratory systems: Secondary | ICD-10-CM | POA: Diagnosis not present

## 2017-06-29 DIAGNOSIS — S1093XA Contusion of unspecified part of neck, initial encounter: Secondary | ICD-10-CM | POA: Diagnosis not present

## 2017-06-29 DIAGNOSIS — J9601 Acute respiratory failure with hypoxia: Secondary | ICD-10-CM | POA: Diagnosis not present

## 2017-06-29 DIAGNOSIS — Z885 Allergy status to narcotic agent status: Secondary | ICD-10-CM | POA: Diagnosis not present

## 2017-06-29 DIAGNOSIS — Z452 Encounter for adjustment and management of vascular access device: Secondary | ICD-10-CM | POA: Diagnosis not present

## 2017-06-29 DIAGNOSIS — J690 Pneumonitis due to inhalation of food and vomit: Secondary | ICD-10-CM | POA: Diagnosis not present

## 2017-06-29 DIAGNOSIS — I82611 Acute embolism and thrombosis of superficial veins of right upper extremity: Secondary | ICD-10-CM | POA: Diagnosis not present

## 2017-06-29 DIAGNOSIS — S199XXA Unspecified injury of neck, initial encounter: Secondary | ICD-10-CM | POA: Diagnosis not present

## 2017-06-29 DIAGNOSIS — E782 Mixed hyperlipidemia: Secondary | ICD-10-CM | POA: Diagnosis not present

## 2017-06-29 DIAGNOSIS — G8191 Hemiplegia, unspecified affecting right dominant side: Secondary | ICD-10-CM | POA: Diagnosis not present

## 2017-06-29 DIAGNOSIS — R7989 Other specified abnormal findings of blood chemistry: Secondary | ICD-10-CM | POA: Diagnosis not present

## 2017-06-29 DIAGNOSIS — Z7982 Long term (current) use of aspirin: Secondary | ICD-10-CM | POA: Diagnosis not present

## 2017-06-29 DIAGNOSIS — R55 Syncope and collapse: Secondary | ICD-10-CM | POA: Diagnosis not present

## 2017-06-29 DIAGNOSIS — I6789 Other cerebrovascular disease: Secondary | ICD-10-CM | POA: Diagnosis not present

## 2017-06-29 DIAGNOSIS — S14109A Unspecified injury at unspecified level of cervical spinal cord, initial encounter: Secondary | ICD-10-CM | POA: Diagnosis present

## 2017-06-29 DIAGNOSIS — I517 Cardiomegaly: Secondary | ICD-10-CM | POA: Diagnosis not present

## 2017-06-29 DIAGNOSIS — D72829 Elevated white blood cell count, unspecified: Secondary | ICD-10-CM | POA: Diagnosis not present

## 2017-06-29 DIAGNOSIS — I10 Essential (primary) hypertension: Secondary | ICD-10-CM | POA: Diagnosis not present

## 2017-06-29 DIAGNOSIS — R531 Weakness: Secondary | ICD-10-CM | POA: Diagnosis not present

## 2017-07-20 ENCOUNTER — Other Ambulatory Visit (HOSPITAL_COMMUNITY): Payer: Medicare Other

## 2017-07-20 ENCOUNTER — Inpatient Hospital Stay
Admission: RE | Admit: 2017-07-20 | Discharge: 2017-08-17 | Disposition: A | Discharge status: Skilled Nursing Facility | Payer: Medicare Other | Source: Other Acute Inpatient Hospital | Attending: Urology | Admitting: Urology

## 2017-07-20 DIAGNOSIS — R0902 Hypoxemia: Secondary | ICD-10-CM | POA: Diagnosis not present

## 2017-07-20 DIAGNOSIS — J156 Pneumonia due to other aerobic Gram-negative bacteria: Secondary | ICD-10-CM | POA: Diagnosis not present

## 2017-07-20 DIAGNOSIS — Z515 Encounter for palliative care: Secondary | ICD-10-CM | POA: Diagnosis not present

## 2017-07-20 DIAGNOSIS — M109 Gout, unspecified: Secondary | ICD-10-CM | POA: Diagnosis present

## 2017-07-20 DIAGNOSIS — J9690 Respiratory failure, unspecified, unspecified whether with hypoxia or hypercapnia: Secondary | ICD-10-CM

## 2017-07-20 DIAGNOSIS — J181 Lobar pneumonia, unspecified organism: Secondary | ICD-10-CM | POA: Diagnosis not present

## 2017-07-20 DIAGNOSIS — J9601 Acute respiratory failure with hypoxia: Secondary | ICD-10-CM | POA: Diagnosis not present

## 2017-07-20 DIAGNOSIS — G629 Polyneuropathy, unspecified: Secondary | ICD-10-CM | POA: Diagnosis present

## 2017-07-20 DIAGNOSIS — M6281 Muscle weakness (generalized): Secondary | ICD-10-CM | POA: Diagnosis not present

## 2017-07-20 DIAGNOSIS — Z9911 Dependence on respirator [ventilator] status: Secondary | ICD-10-CM | POA: Diagnosis not present

## 2017-07-20 DIAGNOSIS — R1312 Dysphagia, oropharyngeal phase: Secondary | ICD-10-CM | POA: Diagnosis not present

## 2017-07-20 DIAGNOSIS — R404 Transient alteration of awareness: Secondary | ICD-10-CM | POA: Diagnosis not present

## 2017-07-20 DIAGNOSIS — I69351 Hemiplegia and hemiparesis following cerebral infarction affecting right dominant side: Secondary | ICD-10-CM | POA: Diagnosis not present

## 2017-07-20 DIAGNOSIS — R491 Aphonia: Secondary | ICD-10-CM | POA: Diagnosis not present

## 2017-07-20 DIAGNOSIS — E785 Hyperlipidemia, unspecified: Secondary | ICD-10-CM | POA: Diagnosis not present

## 2017-07-20 DIAGNOSIS — I2510 Atherosclerotic heart disease of native coronary artery without angina pectoris: Secondary | ICD-10-CM | POA: Diagnosis not present

## 2017-07-20 DIAGNOSIS — G35 Multiple sclerosis: Secondary | ICD-10-CM | POA: Diagnosis not present

## 2017-07-20 DIAGNOSIS — J95821 Acute postprocedural respiratory failure: Secondary | ICD-10-CM | POA: Diagnosis not present

## 2017-07-20 DIAGNOSIS — J95851 Ventilator associated pneumonia: Secondary | ICD-10-CM | POA: Diagnosis not present

## 2017-07-20 DIAGNOSIS — R6510 Systemic inflammatory response syndrome (SIRS) of non-infectious origin without acute organ dysfunction: Secondary | ICD-10-CM | POA: Diagnosis not present

## 2017-07-20 DIAGNOSIS — Z430 Encounter for attention to tracheostomy: Secondary | ICD-10-CM | POA: Diagnosis not present

## 2017-07-20 DIAGNOSIS — Z4659 Encounter for fitting and adjustment of other gastrointestinal appliance and device: Secondary | ICD-10-CM

## 2017-07-20 DIAGNOSIS — Z8673 Personal history of transient ischemic attack (TIA), and cerebral infarction without residual deficits: Secondary | ICD-10-CM | POA: Diagnosis not present

## 2017-07-20 DIAGNOSIS — J9600 Acute respiratory failure, unspecified whether with hypoxia or hypercapnia: Secondary | ICD-10-CM | POA: Diagnosis not present

## 2017-07-20 DIAGNOSIS — R740 Nonspecific elevation of levels of transaminase and lactic acid dehydrogenase [LDH]: Secondary | ICD-10-CM | POA: Diagnosis not present

## 2017-07-20 DIAGNOSIS — D649 Anemia, unspecified: Secondary | ICD-10-CM | POA: Diagnosis present

## 2017-07-20 DIAGNOSIS — Z8674 Personal history of sudden cardiac arrest: Secondary | ICD-10-CM | POA: Diagnosis not present

## 2017-07-20 DIAGNOSIS — J1520 Pneumonia due to staphylococcus, unspecified: Secondary | ICD-10-CM | POA: Diagnosis not present

## 2017-07-20 DIAGNOSIS — R41841 Cognitive communication deficit: Secondary | ICD-10-CM | POA: Diagnosis not present

## 2017-07-20 DIAGNOSIS — I82621 Acute embolism and thrombosis of deep veins of right upper extremity: Secondary | ICD-10-CM | POA: Diagnosis not present

## 2017-07-20 DIAGNOSIS — R0603 Acute respiratory distress: Secondary | ICD-10-CM | POA: Diagnosis not present

## 2017-07-20 DIAGNOSIS — I10 Essential (primary) hypertension: Secondary | ICD-10-CM | POA: Diagnosis present

## 2017-07-20 DIAGNOSIS — G8250 Quadriplegia, unspecified: Secondary | ICD-10-CM | POA: Diagnosis present

## 2017-07-20 DIAGNOSIS — R339 Retention of urine, unspecified: Secondary | ICD-10-CM | POA: Diagnosis not present

## 2017-07-20 DIAGNOSIS — E46 Unspecified protein-calorie malnutrition: Secondary | ICD-10-CM | POA: Diagnosis present

## 2017-07-20 DIAGNOSIS — Z4682 Encounter for fitting and adjustment of non-vascular catheter: Secondary | ICD-10-CM | POA: Diagnosis not present

## 2017-07-20 DIAGNOSIS — A419 Sepsis, unspecified organism: Secondary | ICD-10-CM | POA: Diagnosis not present

## 2017-07-20 DIAGNOSIS — R531 Weakness: Secondary | ICD-10-CM | POA: Diagnosis not present

## 2017-07-20 DIAGNOSIS — I82A11 Acute embolism and thrombosis of right axillary vein: Secondary | ICD-10-CM | POA: Diagnosis present

## 2017-07-20 DIAGNOSIS — J690 Pneumonitis due to inhalation of food and vomit: Secondary | ICD-10-CM | POA: Diagnosis not present

## 2017-07-20 DIAGNOSIS — I639 Cerebral infarction, unspecified: Secondary | ICD-10-CM | POA: Diagnosis not present

## 2017-07-20 DIAGNOSIS — L89153 Pressure ulcer of sacral region, stage 3: Secondary | ICD-10-CM | POA: Diagnosis not present

## 2017-07-20 DIAGNOSIS — E440 Moderate protein-calorie malnutrition: Secondary | ICD-10-CM | POA: Diagnosis not present

## 2017-07-20 DIAGNOSIS — Z6825 Body mass index (BMI) 25.0-25.9, adult: Secondary | ICD-10-CM | POA: Diagnosis not present

## 2017-07-20 DIAGNOSIS — D72829 Elevated white blood cell count, unspecified: Secondary | ICD-10-CM | POA: Diagnosis not present

## 2017-07-20 DIAGNOSIS — S14101A Unspecified injury at C1 level of cervical spinal cord, initial encounter: Secondary | ICD-10-CM | POA: Diagnosis not present

## 2017-07-20 DIAGNOSIS — I609 Nontraumatic subarachnoid hemorrhage, unspecified: Secondary | ICD-10-CM | POA: Diagnosis not present

## 2017-07-20 DIAGNOSIS — I615 Nontraumatic intracerebral hemorrhage, intraventricular: Secondary | ICD-10-CM | POA: Diagnosis not present

## 2017-07-20 DIAGNOSIS — Z931 Gastrostomy status: Secondary | ICD-10-CM

## 2017-07-20 DIAGNOSIS — J151 Pneumonia due to Pseudomonas: Secondary | ICD-10-CM | POA: Diagnosis not present

## 2017-07-20 DIAGNOSIS — M4322 Fusion of spine, cervical region: Secondary | ICD-10-CM | POA: Diagnosis not present

## 2017-07-20 DIAGNOSIS — E870 Hyperosmolality and hypernatremia: Secondary | ICD-10-CM | POA: Diagnosis not present

## 2017-07-20 DIAGNOSIS — R1310 Dysphagia, unspecified: Secondary | ICD-10-CM | POA: Diagnosis present

## 2017-07-20 DIAGNOSIS — E871 Hypo-osmolality and hyponatremia: Secondary | ICD-10-CM | POA: Diagnosis present

## 2017-07-20 DIAGNOSIS — J90 Pleural effusion, not elsewhere classified: Secondary | ICD-10-CM | POA: Diagnosis not present

## 2017-07-20 DIAGNOSIS — J189 Pneumonia, unspecified organism: Secondary | ICD-10-CM | POA: Diagnosis present

## 2017-07-20 DIAGNOSIS — Z930 Tracheostomy status: Secondary | ICD-10-CM | POA: Diagnosis not present

## 2017-07-20 DIAGNOSIS — J9621 Acute and chronic respiratory failure with hypoxia: Secondary | ICD-10-CM | POA: Diagnosis present

## 2017-07-20 DIAGNOSIS — R918 Other nonspecific abnormal finding of lung field: Secondary | ICD-10-CM | POA: Diagnosis not present

## 2017-07-20 LAB — CBC
HCT: 28.3 % — ABNORMAL LOW (ref 39.0–52.0)
Hemoglobin: 9 g/dL — ABNORMAL LOW (ref 13.0–17.0)
MCH: 27.9 pg (ref 26.0–34.0)
MCHC: 31.8 g/dL (ref 30.0–36.0)
MCV: 87.6 fL (ref 78.0–100.0)
PLATELETS: 409 10*3/uL — AB (ref 150–400)
RBC: 3.23 MIL/uL — AB (ref 4.22–5.81)
RDW: 14.1 % (ref 11.5–15.5)
WBC: 14.5 10*3/uL — AB (ref 4.0–10.5)

## 2017-07-20 LAB — PROTIME-INR
INR: 1.17
PROTHROMBIN TIME: 14.8 s (ref 11.4–15.2)

## 2017-07-20 LAB — APTT: APTT: 52 s — AB (ref 24–36)

## 2017-07-20 LAB — HEPARIN LEVEL (UNFRACTIONATED): HEPARIN UNFRACTIONATED: 0.28 [IU]/mL — AB (ref 0.30–0.70)

## 2017-07-21 LAB — BASIC METABOLIC PANEL
ANION GAP: 11 (ref 5–15)
BUN: 21 mg/dL — AB (ref 6–20)
CALCIUM: 8.4 mg/dL — AB (ref 8.9–10.3)
CO2: 24 mmol/L (ref 22–32)
CREATININE: 0.6 mg/dL — AB (ref 0.61–1.24)
Chloride: 97 mmol/L — ABNORMAL LOW (ref 101–111)
GFR calc Af Amer: >60 mL/min (ref >60)
GFR calc non Af Amer: >60 mL/min (ref >60)
GLUCOSE: 134 mg/dL — AB (ref 65–99)
Potassium: 4 mmol/L (ref 3.5–5.1)
Sodium: 132 mmol/L — ABNORMAL LOW (ref 135–145)

## 2017-07-21 LAB — CBC
HCT: 27 % — ABNORMAL LOW (ref 39.0–52.0)
HEMOGLOBIN: 8.4 g/dL — AB (ref 13.0–17.0)
MCH: 27.3 pg (ref 26.0–34.0)
MCHC: 31.1 g/dL (ref 30.0–36.0)
MCV: 87.7 fL (ref 78.0–100.0)
Platelets: 389 10*3/uL (ref 150–400)
RBC: 3.08 MIL/uL — ABNORMAL LOW (ref 4.22–5.81)
RDW: 14 % (ref 11.5–15.5)
WBC: 15.1 10*3/uL — ABNORMAL HIGH (ref 4.0–10.5)

## 2017-07-21 LAB — PROTIME-INR
INR: 1.23
PROTHROMBIN TIME: 15.4 s — AB (ref 11.4–15.2)

## 2017-07-21 LAB — HEPARIN LEVEL (UNFRACTIONATED): HEPARIN UNFRACTIONATED: 0.29 [IU]/mL — AB (ref 0.30–0.70)

## 2017-07-22 ENCOUNTER — Other Ambulatory Visit (HOSPITAL_COMMUNITY): Payer: Medicare Other

## 2017-07-22 DIAGNOSIS — J9690 Respiratory failure, unspecified, unspecified whether with hypoxia or hypercapnia: Secondary | ICD-10-CM | POA: Diagnosis not present

## 2017-07-22 LAB — CBC
HEMATOCRIT: 28 % — AB (ref 39.0–52.0)
Hemoglobin: 8.5 g/dL — ABNORMAL LOW (ref 13.0–17.0)
MCH: 26.9 pg (ref 26.0–34.0)
MCHC: 30.4 g/dL (ref 30.0–36.0)
MCV: 88.6 fL (ref 78.0–100.0)
Platelets: 372 10*3/uL (ref 150–400)
RBC: 3.16 MIL/uL — ABNORMAL LOW (ref 4.22–5.81)
RDW: 14.2 % (ref 11.5–15.5)
WBC: 13.3 10*3/uL — ABNORMAL HIGH (ref 4.0–10.5)

## 2017-07-22 LAB — HEPARIN LEVEL (UNFRACTIONATED): Heparin Unfractionated: 0.35 IU/mL (ref 0.30–0.70)

## 2017-07-23 LAB — BLOOD GAS, ARTERIAL
ACID-BASE EXCESS: 3.2 mmol/L — AB (ref 0.0–2.0)
BICARBONATE: 26.6 mmol/L (ref 20.0–28.0)
FIO2: 0.4
O2 SAT: 96.2 %
PEEP/CPAP: 5 cmH2O
PIP: 15 cmH2O
Patient temperature: 98.6
RATE: 12 resp/min
pCO2 arterial: 36.3 mmHg (ref 32.0–48.0)
pH, Arterial: 7.478 — ABNORMAL HIGH (ref 7.350–7.450)
pO2, Arterial: 82.4 mmHg — ABNORMAL LOW (ref 83.0–108.0)

## 2017-07-23 LAB — BASIC METABOLIC PANEL
ANION GAP: 7 (ref 5–15)
BUN: 19 mg/dL (ref 6–20)
CALCIUM: 8.5 mg/dL — AB (ref 8.9–10.3)
CO2: 27 mmol/L (ref 22–32)
Chloride: 99 mmol/L — ABNORMAL LOW (ref 101–111)
Creatinine, Ser: 0.41 mg/dL — ABNORMAL LOW (ref 0.61–1.24)
Glucose, Bld: 133 mg/dL — ABNORMAL HIGH (ref 65–99)
POTASSIUM: 4.1 mmol/L (ref 3.5–5.1)
Sodium: 133 mmol/L — ABNORMAL LOW (ref 135–145)

## 2017-07-23 LAB — HEPARIN LEVEL (UNFRACTIONATED)
HEPARIN UNFRACTIONATED: 0.3 [IU]/mL (ref 0.30–0.70)
Heparin Unfractionated: 0.4 IU/mL (ref 0.30–0.70)

## 2017-07-23 MED ORDER — HYDROXYZINE HCL 25 MG PO TABS
25.00 | ORAL_TABLET | ORAL | Status: DC
Start: ? — End: 2017-07-23

## 2017-07-23 MED ORDER — ANTACID & ANTIGAS 200-200-20 MG/5ML PO SUSP
15.00 | ORAL | Status: DC
Start: ? — End: 2017-07-23

## 2017-07-23 MED ORDER — ALBUTEROL SULFATE HFA 108 (90 BASE) MCG/ACT IN AERS
INHALATION_SPRAY | RESPIRATORY_TRACT | Status: DC
Start: ? — End: 2017-07-23

## 2017-07-23 MED ORDER — POTASSIUM CHLORIDE CRYS ER 20 MEQ PO TBCR
EXTENDED_RELEASE_TABLET | ORAL | Status: DC
Start: ? — End: 2017-07-23

## 2017-07-23 MED ORDER — CHLORHEXIDINE GLUCONATE 0.12 % MT SOLN
15.00 | OROMUCOSAL | Status: DC
Start: 2017-07-20 — End: 2017-07-23

## 2017-07-23 MED ORDER — LISINOPRIL 10 MG PO TABS
10.00 | ORAL_TABLET | ORAL | Status: DC
Start: 2017-07-21 — End: 2017-07-23

## 2017-07-23 MED ORDER — OXYCODONE HCL 10 MG PO TABS
10.00 | ORAL_TABLET | ORAL | Status: DC
Start: ? — End: 2017-07-23

## 2017-07-23 MED ORDER — GENERIC EXTERNAL MEDICATION
Status: DC
Start: ? — End: 2017-07-23

## 2017-07-23 MED ORDER — POTASSIUM CHLORIDE 20 MEQ/50ML IV SOLN
INTRAVENOUS | Status: DC
Start: ? — End: 2017-07-23

## 2017-07-23 MED ORDER — FAMOTIDINE 20 MG PO TABS
20.00 | ORAL_TABLET | ORAL | Status: DC
Start: 2017-07-20 — End: 2017-07-23

## 2017-07-23 MED ORDER — MAGNESIUM OXIDE 400 MG PO TABS
800.00 | ORAL_TABLET | ORAL | Status: DC
Start: 2017-07-20 — End: 2017-07-23

## 2017-07-23 MED ORDER — OXYCODONE HCL 5 MG PO TABS
5.00 | ORAL_TABLET | ORAL | Status: DC
Start: ? — End: 2017-07-23

## 2017-07-23 MED ORDER — HEPARIN SOD (PORCINE) IN D5W 100 UNIT/ML IV SOLN
INTRAVENOUS | Status: DC
Start: ? — End: 2017-07-23

## 2017-07-23 MED ORDER — POTASSIUM CHLORIDE 20 MEQ/15ML (10%) PO SOLN
ORAL | Status: DC
Start: ? — End: 2017-07-23

## 2017-07-23 MED ORDER — BENZOCAINE-MENTHOL 15-3.6 MG MT LOZG
LOZENGE | OROMUCOSAL | Status: DC
Start: ? — End: 2017-07-23

## 2017-07-23 MED ORDER — ACETAMINOPHEN 160 MG/5ML PO SUSP
650.00 | ORAL | Status: DC
Start: ? — End: 2017-07-23

## 2017-07-23 MED ORDER — FEBUXOSTAT 40 MG PO TABS
40.00 | ORAL_TABLET | ORAL | Status: DC
Start: 2017-07-21 — End: 2017-07-23

## 2017-07-23 MED ORDER — SODIUM CHLORIDE 0.9 % IV SOLN
INTRAVENOUS | Status: DC
Start: ? — End: 2017-07-23

## 2017-07-23 MED ORDER — GENERIC EXTERNAL MEDICATION
2.00 | Status: DC
Start: 2017-07-20 — End: 2017-07-23

## 2017-07-24 ENCOUNTER — Ambulatory Visit: Payer: Medicare Other | Admitting: Neurology

## 2017-07-24 LAB — HEPARIN LEVEL (UNFRACTIONATED)
HEPARIN UNFRACTIONATED: 0.31 [IU]/mL (ref 0.30–0.70)
HEPARIN UNFRACTIONATED: 0.29 [IU]/mL — AB (ref 0.30–0.70)

## 2017-07-24 LAB — CBC
HCT: 27 % — ABNORMAL LOW (ref 39.0–52.0)
HEMOGLOBIN: 8.3 g/dL — AB (ref 13.0–17.0)
MCH: 27.2 pg (ref 26.0–34.0)
MCHC: 30.7 g/dL (ref 30.0–36.0)
MCV: 88.5 fL (ref 78.0–100.0)
PLATELETS: 353 10*3/uL (ref 150–400)
RBC: 3.05 MIL/uL — AB (ref 4.22–5.81)
RDW: 14 % (ref 11.5–15.5)
WBC: 11.1 10*3/uL — AB (ref 4.0–10.5)

## 2017-07-24 LAB — MAGNESIUM: MAGNESIUM: 1.9 mg/dL (ref 1.7–2.4)

## 2017-07-24 LAB — BASIC METABOLIC PANEL
ANION GAP: 11 (ref 5–15)
BUN: 22 mg/dL — ABNORMAL HIGH (ref 6–20)
CHLORIDE: 98 mmol/L — AB (ref 101–111)
CO2: 24 mmol/L (ref 22–32)
Calcium: 8.6 mg/dL — ABNORMAL LOW (ref 8.9–10.3)
Creatinine, Ser: 0.43 mg/dL — ABNORMAL LOW (ref 0.61–1.24)
Glucose, Bld: 115 mg/dL — ABNORMAL HIGH (ref 65–99)
POTASSIUM: 4 mmol/L (ref 3.5–5.1)
SODIUM: 133 mmol/L — AB (ref 135–145)

## 2017-07-24 LAB — PHOSPHORUS: PHOSPHORUS: 3.7 mg/dL (ref 2.5–4.6)

## 2017-07-25 ENCOUNTER — Other Ambulatory Visit (HOSPITAL_COMMUNITY): Payer: Medicare Other

## 2017-07-25 DIAGNOSIS — G35 Multiple sclerosis: Secondary | ICD-10-CM | POA: Diagnosis not present

## 2017-07-25 LAB — CBC
HCT: 28.8 % — ABNORMAL LOW (ref 39.0–52.0)
Hemoglobin: 8.8 g/dL — ABNORMAL LOW (ref 13.0–17.0)
MCH: 26.6 pg (ref 26.0–34.0)
MCHC: 30.6 g/dL (ref 30.0–36.0)
MCV: 87 fL (ref 78.0–100.0)
PLATELETS: 320 10*3/uL (ref 150–400)
RBC: 3.31 MIL/uL — AB (ref 4.22–5.81)
RDW: 13.8 % (ref 11.5–15.5)
WBC: 9 10*3/uL (ref 4.0–10.5)

## 2017-07-25 LAB — BASIC METABOLIC PANEL
ANION GAP: 8 (ref 5–15)
BUN: 20 mg/dL (ref 6–20)
CALCIUM: 8.7 mg/dL — AB (ref 8.9–10.3)
CO2: 27 mmol/L (ref 22–32)
Chloride: 97 mmol/L — ABNORMAL LOW (ref 101–111)
Creatinine, Ser: 0.5 mg/dL — ABNORMAL LOW (ref 0.61–1.24)
GFR calc Af Amer: >60 mL/min (ref >60)
Glucose, Bld: 113 mg/dL — ABNORMAL HIGH (ref 65–99)
POTASSIUM: 4.1 mmol/L (ref 3.5–5.1)
Sodium: 132 mmol/L — ABNORMAL LOW (ref 135–145)

## 2017-07-25 LAB — PHOSPHORUS: Phosphorus: 3.9 mg/dL (ref 2.5–4.6)

## 2017-07-25 LAB — MAGNESIUM: MAGNESIUM: 1.9 mg/dL (ref 1.7–2.4)

## 2017-07-25 MED ORDER — GENERIC EXTERNAL MEDICATION
Status: DC
Start: ? — End: 2017-07-25

## 2017-07-27 LAB — CBC
HEMATOCRIT: 35 % — AB (ref 39.0–52.0)
HEMOGLOBIN: 10.7 g/dL — AB (ref 13.0–17.0)
MCH: 27 pg (ref 26.0–34.0)
MCHC: 30.6 g/dL (ref 30.0–36.0)
MCV: 88.2 fL (ref 78.0–100.0)
Platelets: 353 10*3/uL (ref 150–400)
RBC: 3.97 MIL/uL — ABNORMAL LOW (ref 4.22–5.81)
RDW: 14.2 % (ref 11.5–15.5)
WBC: 17.1 10*3/uL — ABNORMAL HIGH (ref 4.0–10.5)

## 2017-07-27 LAB — BASIC METABOLIC PANEL
ANION GAP: 9 (ref 5–15)
BUN: 30 mg/dL — ABNORMAL HIGH (ref 6–20)
CHLORIDE: 98 mmol/L — AB (ref 101–111)
CO2: 27 mmol/L (ref 22–32)
Calcium: 9 mg/dL (ref 8.9–10.3)
Creatinine, Ser: 0.44 mg/dL — ABNORMAL LOW (ref 0.61–1.24)
GFR calc non Af Amer: >60 mL/min (ref >60)
Glucose, Bld: 127 mg/dL — ABNORMAL HIGH (ref 65–99)
Potassium: 4.7 mmol/L (ref 3.5–5.1)
Sodium: 134 mmol/L — ABNORMAL LOW (ref 135–145)

## 2017-07-27 LAB — URINALYSIS, ROUTINE W REFLEX MICROSCOPIC
BILIRUBIN URINE: NEGATIVE
GLUCOSE, UA: NEGATIVE mg/dL
HGB URINE DIPSTICK: NEGATIVE
KETONES UR: NEGATIVE mg/dL
Leukocytes, UA: NEGATIVE
NITRITE: NEGATIVE
PROTEIN: 30 mg/dL — AB
Specific Gravity, Urine: 1.025 (ref 1.005–1.030)
Squamous Epithelial / LPF: NONE SEEN
pH: 6 (ref 5.0–8.0)

## 2017-07-27 LAB — MAGNESIUM: Magnesium: 2 mg/dL (ref 1.7–2.4)

## 2017-07-28 LAB — BASIC METABOLIC PANEL
ANION GAP: 7 (ref 5–15)
BUN: 31 mg/dL — ABNORMAL HIGH (ref 6–20)
CO2: 26 mmol/L (ref 22–32)
Calcium: 8.7 mg/dL — ABNORMAL LOW (ref 8.9–10.3)
Chloride: 99 mmol/L — ABNORMAL LOW (ref 101–111)
Creatinine, Ser: 0.43 mg/dL — ABNORMAL LOW (ref 0.61–1.24)
GFR calc Af Amer: >60 mL/min (ref >60)
GFR calc non Af Amer: >60 mL/min (ref >60)
GLUCOSE: 124 mg/dL — AB (ref 65–99)
POTASSIUM: 4.3 mmol/L (ref 3.5–5.1)
Sodium: 132 mmol/L — ABNORMAL LOW (ref 135–145)

## 2017-07-28 LAB — CBC
HEMATOCRIT: 33.5 % — AB (ref 39.0–52.0)
Hemoglobin: 10.4 g/dL — ABNORMAL LOW (ref 13.0–17.0)
MCH: 27.7 pg (ref 26.0–34.0)
MCHC: 31 g/dL (ref 30.0–36.0)
MCV: 89.1 fL (ref 78.0–100.0)
Platelets: 292 10*3/uL (ref 150–400)
RBC: 3.76 MIL/uL — ABNORMAL LOW (ref 4.22–5.81)
RDW: 14.4 % (ref 11.5–15.5)
WBC: 16.1 10*3/uL — AB (ref 4.0–10.5)

## 2017-07-28 LAB — URINE CULTURE: Culture: NO GROWTH

## 2017-07-30 LAB — CBC WITH DIFFERENTIAL/PLATELET
BASOS PCT: 1 %
Basophils Absolute: 0.1 10*3/uL (ref 0.0–0.1)
EOS ABS: 0.3 10*3/uL (ref 0.0–0.7)
Eosinophils Relative: 3 %
HCT: 31.8 % — ABNORMAL LOW (ref 39.0–52.0)
Hemoglobin: 9.9 g/dL — ABNORMAL LOW (ref 13.0–17.0)
LYMPHS ABS: 0.8 10*3/uL (ref 0.7–4.0)
Lymphocytes Relative: 6 %
MCH: 27.4 pg (ref 26.0–34.0)
MCHC: 31.1 g/dL (ref 30.0–36.0)
MCV: 88.1 fL (ref 78.0–100.0)
MONOS PCT: 12 %
Monocytes Absolute: 1.5 10*3/uL — ABNORMAL HIGH (ref 0.1–1.0)
Neutro Abs: 9.5 10*3/uL — ABNORMAL HIGH (ref 1.7–7.7)
Neutrophils Relative %: 78 %
PLATELETS: 280 10*3/uL (ref 150–400)
RBC: 3.61 MIL/uL — AB (ref 4.22–5.81)
RDW: 14.5 % (ref 11.5–15.5)
WBC: 12.1 10*3/uL — AB (ref 4.0–10.5)

## 2017-07-30 LAB — BASIC METABOLIC PANEL
Anion gap: 10 (ref 5–15)
BUN: 37 mg/dL — AB (ref 6–20)
CO2: 25 mmol/L (ref 22–32)
CREATININE: 0.47 mg/dL — AB (ref 0.61–1.24)
Calcium: 8.8 mg/dL — ABNORMAL LOW (ref 8.9–10.3)
Chloride: 104 mmol/L (ref 101–111)
GFR calc Af Amer: >60 mL/min (ref >60)
GFR calc non Af Amer: >60 mL/min (ref >60)
Glucose, Bld: 125 mg/dL — ABNORMAL HIGH (ref 65–99)
Potassium: 4 mmol/L (ref 3.5–5.1)
SODIUM: 139 mmol/L (ref 135–145)

## 2017-07-30 LAB — PHOSPHORUS: Phosphorus: 4.5 mg/dL (ref 2.5–4.6)

## 2017-07-30 LAB — PROCALCITONIN: Procalcitonin: 0.19 ng/mL

## 2017-07-30 LAB — MAGNESIUM: MAGNESIUM: 2 mg/dL (ref 1.7–2.4)

## 2017-08-01 LAB — BASIC METABOLIC PANEL
Anion gap: 11 (ref 5–15)
BUN: 35 mg/dL — AB (ref 6–20)
CALCIUM: 8.4 mg/dL — AB (ref 8.9–10.3)
CO2: 23 mmol/L (ref 22–32)
Chloride: 110 mmol/L (ref 101–111)
Creatinine, Ser: 0.52 mg/dL — ABNORMAL LOW (ref 0.61–1.24)
Glucose, Bld: 121 mg/dL — ABNORMAL HIGH (ref 65–99)
Potassium: 3.7 mmol/L (ref 3.5–5.1)
SODIUM: 144 mmol/L (ref 135–145)

## 2017-08-01 LAB — CULTURE, RESPIRATORY W GRAM STAIN

## 2017-08-01 LAB — CBC
HCT: 32 % — ABNORMAL LOW (ref 39.0–52.0)
HEMOGLOBIN: 9.7 g/dL — AB (ref 13.0–17.0)
MCH: 27.1 pg (ref 26.0–34.0)
MCHC: 30.3 g/dL (ref 30.0–36.0)
MCV: 89.4 fL (ref 78.0–100.0)
PLATELETS: 292 10*3/uL (ref 150–400)
RBC: 3.58 MIL/uL — ABNORMAL LOW (ref 4.22–5.81)
RDW: 14.9 % (ref 11.5–15.5)
WBC: 10.6 10*3/uL — ABNORMAL HIGH (ref 4.0–10.5)

## 2017-08-01 LAB — MAGNESIUM: MAGNESIUM: 2 mg/dL (ref 1.7–2.4)

## 2017-08-01 LAB — CULTURE, RESPIRATORY

## 2017-08-02 ENCOUNTER — Other Ambulatory Visit (HOSPITAL_COMMUNITY): Payer: Medicare Other

## 2017-08-02 DIAGNOSIS — Z4682 Encounter for fitting and adjustment of non-vascular catheter: Secondary | ICD-10-CM | POA: Diagnosis not present

## 2017-08-02 MED ORDER — GENERIC EXTERNAL MEDICATION
Status: DC
Start: ? — End: 2017-08-02

## 2017-08-03 DIAGNOSIS — I639 Cerebral infarction, unspecified: Secondary | ICD-10-CM | POA: Diagnosis not present

## 2017-08-03 DIAGNOSIS — R0902 Hypoxemia: Secondary | ICD-10-CM | POA: Diagnosis not present

## 2017-08-03 DIAGNOSIS — R1310 Dysphagia, unspecified: Secondary | ICD-10-CM | POA: Diagnosis not present

## 2017-08-03 NOTE — Consult Note (Signed)
Chief Complaint: Patient was seen in consultation today for percutaneous gastric tube placement at the request of Dr Ardeth Sportsman   Supervising Physician: Malachy Moan  Patient Status: [facility]  History of Present Illness: Richard Mcpherson is a 77 y.o. 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 Atrial premature beats   . Blood in stool   . Cerebral aneurysm   . Coronary atherosclerosis of native coronary artery   . Embolism (HCC)    History of right lower extremity distal embolism   . Gout   . Hyperglycemia   . Hyperlipidemia, mixed   . Osteoarthritis   . Plantar fasciitis   . Sinusitis   . Stroke (HCC)   . Subarachnoid hemorrhage (HCC)   . Unspecified essential hypertension     Past Surgical History:  Procedure Laterality Date  . APPENDECTOMY    . cardiac catherization    . CATARACT EXTRACTION W/PHACO Right 02/19/2014   Procedure: CATARACT EXTRACTION PHACO AND INTRAOCULAR LENS PLACEMENT (IOC);  Surgeon: Gemma Payor, MD;  Location: [facility];  Service: Ophthalmology;  Laterality: Right;  CDE 12.71  . CATARACT EXTRACTION W/PHACO Left 12/28/2014   Procedure: CATARACT EXTRACTION PHACO AND INTRAOCULAR LENS PLACEMENT (IOC);  Surgeon: Gemma Payor, MD;  Location: [facility];  Service: Ophthalmology;  Laterality: Left;  CDE:8.05  . embolectomy 2000      Allergies: Codeine; Allopurinol; Crestor [rosuvastatin calcium]; and Lipitor [atorvastatin]  Medications: Prior to Admission medications   Medication Sig Start Date End Date Taking? Authorizing Provider  aspirin EC 81 MG tablet Take 81 mg by mouth daily.    [provider]  atenolol-chlorthalidone (TENORETIC) 50-25 MG  per tablet Take 1 tablet by mouth daily.    [provider]  chlorthalidone (HYGROTON) 50 MG tablet Take by mouth.    [provider]  diclofenac (VOLTAREN) 75 MG EC tablet Take 75 mg by mouth 2 (two) times daily as needed. 04/16/17   [provider]  Misc Natural Products (TART CHERRY ADVANCED) CAPS Take by mouth.    [provider]  Naproxen Sodium (ALEVE PO) Take by mouth.    [provider]  OVER THE COUNTER MEDICATION     [provider]  psyllium (METAMUCIL) 58.6 % powder Take 1 packet by mouth 3 (three) times daily.    [provider]     Family History  Problem Relation Age of Onset  . Cancer Mother     Social History   Socioeconomic History  . Marital status: Married    Spouse name: Not on file  . Number of children: Not on file  . Years of education: Not on file  . Highest education level: Not on file  Social Needs  . Financial resource strain: Not on file  . Food insecurity - worry: Not on file  . Food insecurity - inability: Not on file  . Transportation needs - medical: Not on file  . Transportation needs - non-medical: Not on file  Occupational History  . Occupation: retired  Tobacco Use  . Smoking status: Former Smoker    Packs/day: 1.00    Years: 40.00    Pack years: 40.00  . Smokeless tobacco: Current User  Types: Snuff  Substance and Sexual Activity  . Alcohol use: Yes    Comment: 1-2 beers every other day   . Drug use: No  . Sexual activity: Not on file  Other Topics Concern  . Not on file  Social History Narrative  . Not on file     Review of Systems: A 12 point ROS discussed and pertinent positives are indicated in the HPI above.  All other systems are negative.  Review of Systems  Constitutional:       Trach/vent No response    Vital Signs: There were no vitals taken for this visit.  Physical Exam  Eyes:  Does open eyes to me while in room Wife at bedside    Pulmonary/Chest:  vent  Abdominal: Soft.  Skin: Skin is warm and dry.  Psychiatric:  Consent signed by wife  Nursing note and vitals reviewed.   Imaging: Ct Abdomen Wo Contrast  Result Date: 08/03/2017 CLINICAL DATA:  Gastrostomy tube evaluation EXAM: CT ABDOMEN WITHOUT CONTRAST TECHNIQUE: Multidetector CT imaging of the abdomen was performed following the standard protocol without IV contrast. COMPARISON:  None. FINDINGS: Lower chest: Bibasilar dependent consolidation left greater than right. Hepatobiliary: Unremarkable Pancreas: Unremarkable Spleen: Unremarkable Adrenals/Urinary Tract: Vascular calcifications are noted. Renal parenchyma is within normal limits. Adrenal glands are within normal limits. Stomach/Bowel: NG tube is in place. The stomach is positioned between the liver and transverse colon. No obvious mass in the colon. No evidence of small-bowel obstruction. Vascular/Lymphatic: Atherosclerotic calcifications of the aorta and iliac arteries. Other: No free fluid. Musculoskeletal: No vertebral compression deformity. Degenerative changes in the hips and lumbar spine. Additional imaging through the pelvis: Bladder is unremarkable. Prostate is within normal limits. IMPRESSION: There is favorable anatomy for gastrostomy tube placement. Bibasilar pulmonary consolidation left greater than right. Electronically Signed   By: Jolaine Click M.D.   On: 08/03/2017 08:15   [name]  Result Date: 07/25/2017 CLINICAL DATA:  Ventilator support.  Multiple strokes. EXAM: MRI HEAD WITHOUT CONTRAST TECHNIQUE: Multiplanar, multiecho pulse sequences of the brain and surrounding structures were obtained without intravenous contrast. COMPARISON:  CT studies 06/29/2017.  MRI 04/25/2017. FINDINGS: Brain: Diffusion imaging shows a 4 mm acute infarction in the cerebral peduncle on the right. No other acute infarction. There are old small vessel cerebellar infarctions. There is old infarction within the  left para median pons. There chronic small-vessel ischemic changes of the hemispheric white matter. No large vessel territory infarction. Chronic ventriculomegaly without change. Previously coiled circle of Willis aneurysm. I suspect that there is a thin subdural hematoma in the posterior fossa on the right. This could be artifact, given pronounced susceptibility artifact in the region, but I think is probably real. Maximal thickness 3 mm. No mass effect. The patient has some layering blood products in the occipital horns of both lateral ventricles. Vascular: Major vessels at the base of the brain show flow. Skull and upper cervical spine: It appears that the patient has had extensive cervical decompression and fusion, possibly with extension to the occiput. Sinuses/Orbits: Clear/normal Other: None IMPRESSION: Acute 4 mm infarction right cerebral peduncle. Thin subdural hematoma posterior fossa on the right, maximal thickness 3 mm. Small amount of blood dependent in the occipital horns of both lateral ventricles. Question if these hemorrhagic findings relate to the recent craniocervical surgical procedure. Chronic ischemic changes elsewhere throughout the brain. Chronic ventriculomegaly. Electronically Signed   By: Paulina Fusi M.D.   On: 07/25/2017 14:26   Dg Chest  Port 1 View  Result Date: 07/22/2017 CLINICAL DATA:  Respiratory failure EXAM: PORTABLE CHEST 1 VIEW COMPARISON:  11/23/2016 FINDINGS: Tracheostomy in satisfactory position. Enteric tube courses into the stomach. Left arm PICC terminates in the lower SVC. Left lower lobe opacity, likely a combination of atelectasis and small left pleural effusion. The heart is normal in size. IMPRESSION: Left lower lobe opacity, likely combination of atelectasis and small left pleural effusion. Tracheostomy in satisfactory position. Additional support apparatus as above. Electronically Signed   By: Charline Bills M.D.   On: 07/22/2017 09:56   Dg Abd Portable  1v  Result Date: 07/20/2017 CLINICAL DATA:  77 y/o  M; enteric tube. EXAM: PORTABLE ABDOMEN - 1 VIEW COMPARISON:  None. FINDINGS: Enteric tube tip projects over gastric body. Normal bowel gas body. Multilevel degenerative changes of lumbar spine. IMPRESSION: Enteric tube tip projects over gastric body. Electronically Signed   By: Mitzi Hansen M.D.   On: 07/20/2017 21:53    Labs:  CBC: Recent Labs    07/27/17 0606 07/28/17 0403 07/30/17 0834 08/01/17 0722  WBC 17.1* 16.1* 12.1* 10.6*  HGB 10.7* 10.4* 9.9* 9.7*  HCT 35.0* 33.5* 31.8* 32.0*  PLT 353 292 280 292    COAGS: Recent Labs    07/20/17 2307 07/21/17 0559  INR 1.17 1.23  APTT 52*  --     BMP: Recent Labs    07/27/17 0606 07/28/17 0403 07/30/17 0834 08/01/17 0722  NA 134* 132* 139 144  K 4.7 4.3 4.0 3.7  CL 98* 99* 104 110  CO2 27 26 25 23   GLUCOSE 127* 124* 125* 121*  BUN 30* 31* 37* 35*  CALCIUM 9.0 8.7* 8.8* 8.4*  CREATININE 0.44* 0.43* 0.47* 0.52*  GFRNONAA >60 >60 >60 >60  GFRAA >60 >60 >60 >60    LIVER FUNCTION TESTS: Recent Labs    04/18/17 1020  BILITOT 0.5  AST 21  ALT 21  ALKPHOS 95  PROT 7.0  ALBUMIN 4.3    TUMOR MARKERS: No results for input(s): AFPTM, CEA, CA199, CHROMGRNA in the last 8760 hours.  Assessment and Plan:  CVA ICH- fall Hypoxia after PNA on vent Dysphagia Deconditioning Need for long term care Last dose Eliquis 08/03/17 Plan for percutaneous gastric tube placement 1/21 in IR Risks and benefits discussed with the patient's wife including, but not limited to the need for a barium enema during the procedure, bleeding, infection, peritonitis, or damage to adjacent structures. All of her questions were answered, patient is agreeable to proceed. Consent signed and in chart.   Thank you for this interesting consult.  I greatly enjoyed meeting EROL FLANAGIN and look forward to participating in their care.  A copy of this report was sent to the requesting  provider on this date.  Electronically Signed: Robet Leu, PA-C 08/03/2017, 10:57 AM   I spent a total of 40 Minutes    in face to face in clinical consultation, greater than 50% of which was counseling/coordinating care for percutaneous gastric tube placement

## 2017-08-04 LAB — CBC WITH DIFFERENTIAL/PLATELET
BASOS ABS: 0.1 10*3/uL (ref 0.0–0.1)
BASOS PCT: 1 %
Eosinophils Absolute: 0.7 10*3/uL (ref 0.0–0.7)
Eosinophils Relative: 7 %
HEMATOCRIT: 33.6 % — AB (ref 39.0–52.0)
Hemoglobin: 10 g/dL — ABNORMAL LOW (ref 13.0–17.0)
Lymphocytes Relative: 15 %
Lymphs Abs: 1.4 10*3/uL (ref 0.7–4.0)
MCH: 26.6 pg (ref 26.0–34.0)
MCHC: 29.8 g/dL — ABNORMAL LOW (ref 30.0–36.0)
MCV: 89.4 fL (ref 78.0–100.0)
MONO ABS: 0.7 10*3/uL (ref 0.1–1.0)
Monocytes Relative: 7 %
NEUTROS ABS: 6.7 10*3/uL (ref 1.7–7.7)
NEUTROS PCT: 70 %
Platelets: 367 10*3/uL (ref 150–400)
RBC: 3.76 MIL/uL — ABNORMAL LOW (ref 4.22–5.81)
RDW: 14.9 % (ref 11.5–15.5)
WBC: 9.5 10*3/uL (ref 4.0–10.5)

## 2017-08-04 LAB — APTT: aPTT: 33 seconds (ref 24–36)

## 2017-08-04 LAB — PROTIME-INR
INR: 1.16
PROTHROMBIN TIME: 14.7 s (ref 11.4–15.2)

## 2017-08-05 LAB — HEPARIN LEVEL (UNFRACTIONATED)
HEPARIN UNFRACTIONATED: 0.43 [IU]/mL (ref 0.30–0.70)
Heparin Unfractionated: >2.2 IU/mL — ABNORMAL HIGH (ref 0.30–0.70)

## 2017-08-05 LAB — CULTURE, BLOOD (ROUTINE X 2)
CULTURE: NO GROWTH
Culture: NO GROWTH
SPECIAL REQUESTS: ADEQUATE
Special Requests: ADEQUATE

## 2017-08-06 ENCOUNTER — Encounter (HOSPITAL_COMMUNITY): Payer: Self-pay | Admitting: Interventional Radiology

## 2017-08-06 ENCOUNTER — Other Ambulatory Visit (HOSPITAL_COMMUNITY): Payer: Medicare Other

## 2017-08-06 DIAGNOSIS — R1310 Dysphagia, unspecified: Secondary | ICD-10-CM | POA: Diagnosis not present

## 2017-08-06 HISTORY — PX: IR GASTROSTOMY TUBE MOD SED: IMG625

## 2017-08-06 LAB — CBC WITH DIFFERENTIAL/PLATELET
BASOS ABS: 0.1 10*3/uL (ref 0.0–0.1)
Basophils Relative: 1 %
EOS ABS: 0.5 10*3/uL (ref 0.0–0.7)
EOS PCT: 5 %
HCT: 34.3 % — ABNORMAL LOW (ref 39.0–52.0)
Hemoglobin: 10.2 g/dL — ABNORMAL LOW (ref 13.0–17.0)
LYMPHS ABS: 1.6 10*3/uL (ref 0.7–4.0)
Lymphocytes Relative: 16 %
MCH: 26.8 pg (ref 26.0–34.0)
MCHC: 29.7 g/dL — ABNORMAL LOW (ref 30.0–36.0)
MCV: 90 fL (ref 78.0–100.0)
Monocytes Absolute: 0.7 10*3/uL (ref 0.1–1.0)
Monocytes Relative: 7 %
Neutro Abs: 7.1 10*3/uL (ref 1.7–7.7)
Neutrophils Relative %: 71 %
PLATELETS: 382 10*3/uL (ref 150–400)
RBC: 3.81 MIL/uL — AB (ref 4.22–5.81)
RDW: 15.2 % (ref 11.5–15.5)
WBC: 10 10*3/uL (ref 4.0–10.5)

## 2017-08-06 LAB — BASIC METABOLIC PANEL
Anion gap: 11 (ref 5–15)
BUN: 46 mg/dL — AB (ref 6–20)
CO2: 24 mmol/L (ref 22–32)
CREATININE: 0.5 mg/dL — AB (ref 0.61–1.24)
Calcium: 8.7 mg/dL — ABNORMAL LOW (ref 8.9–10.3)
Chloride: 109 mmol/L (ref 101–111)
GFR calc Af Amer: >60 mL/min (ref >60)
Glucose, Bld: 100 mg/dL — ABNORMAL HIGH (ref 65–99)
Potassium: 3.5 mmol/L (ref 3.5–5.1)
SODIUM: 144 mmol/L (ref 135–145)

## 2017-08-06 LAB — MAGNESIUM: MAGNESIUM: 2 mg/dL (ref 1.7–2.4)

## 2017-08-06 LAB — PROTIME-INR
INR: 1.11
Prothrombin Time: 14.2 seconds (ref 11.4–15.2)

## 2017-08-06 MED ORDER — MIDAZOLAM HCL 2 MG/2ML IJ SOLN
INTRAMUSCULAR | Status: AC | PRN
Start: 2017-08-06 — End: 2017-08-06
  Administered 2017-08-06: 0.5 mg via INTRAVENOUS

## 2017-08-06 MED ORDER — GLUCAGON HCL (RDNA) 1 MG IJ SOLR
INTRAMUSCULAR | Status: AC | PRN
  Administered 2017-08-06: .5 mg via INTRAVENOUS

## 2017-08-06 MED ORDER — MIDAZOLAM HCL 2 MG/2ML IJ SOLN
INTRAMUSCULAR | Status: AC
  Filled 2017-08-06: qty 2

## 2017-08-06 MED ORDER — FENTANYL CITRATE (PF) 100 MCG/2ML IJ SOLN
INTRAMUSCULAR | Status: AC
  Filled 2017-08-06: qty 2

## 2017-08-06 MED ORDER — IOPAMIDOL (ISOVUE-300) INJECTION 61%
INTRAVENOUS | Status: AC
  Administered 2017-08-06: 10 mL
  Filled 2017-08-06: qty 50

## 2017-08-06 MED ORDER — LIDOCAINE HCL 1 % IJ SOLN
INTRAMUSCULAR | Status: AC
  Filled 2017-08-06: qty 20

## 2017-08-06 MED ORDER — GLUCAGON HCL RDNA (DIAGNOSTIC) 1 MG IJ SOLR
INTRAMUSCULAR | Status: AC
  Filled 2017-08-06: qty 1

## 2017-08-06 MED ORDER — GENERIC EXTERNAL MEDICATION
Status: DC
Start: ? — End: 2017-08-06

## 2017-08-06 MED ORDER — FENTANYL CITRATE (PF) 100 MCG/2ML IJ SOLN
INTRAMUSCULAR | Status: AC | PRN
  Administered 2017-08-06: 25 ug via INTRAVENOUS

## 2017-08-06 MED ORDER — LIDOCAINE HCL (PF) 1 % IJ SOLN
INTRAMUSCULAR | Status: AC | PRN
  Administered 2017-08-06: 2 mL

## 2017-08-06 NOTE — Procedures (Signed)
Dysphagia  S/p fluoro 20 fr gtube No comp Stable ebl 0 Full use tomorrow Full report in pacs

## 2017-08-09 LAB — BASIC METABOLIC PANEL
ANION GAP: 11 (ref 5–15)
BUN: 41 mg/dL — ABNORMAL HIGH (ref 6–20)
CHLORIDE: 111 mmol/L (ref 101–111)
CO2: 23 mmol/L (ref 22–32)
Calcium: 8.5 mg/dL — ABNORMAL LOW (ref 8.9–10.3)
Creatinine, Ser: 0.55 mg/dL — ABNORMAL LOW (ref 0.61–1.24)
GFR calc Af Amer: >60 mL/min (ref >60)
GLUCOSE: 143 mg/dL — AB (ref 65–99)
POTASSIUM: 3.7 mmol/L (ref 3.5–5.1)
Sodium: 145 mmol/L (ref 135–145)

## 2017-08-09 LAB — URINALYSIS, ROUTINE W REFLEX MICROSCOPIC
Bilirubin Urine: NEGATIVE
GLUCOSE, UA: NEGATIVE mg/dL
Ketones, ur: NEGATIVE mg/dL
LEUKOCYTES UA: NEGATIVE
NITRITE: NEGATIVE
PROTEIN: 30 mg/dL — AB
Specific Gravity, Urine: 1.031 — ABNORMAL HIGH (ref 1.005–1.030)
Squamous Epithelial / LPF: NONE SEEN
pH: 5 (ref 5.0–8.0)

## 2017-08-09 LAB — MAGNESIUM: Magnesium: 2 mg/dL (ref 1.7–2.4)

## 2017-08-09 LAB — CBC
HCT: 39.2 % (ref 39.0–52.0)
Hemoglobin: 11.6 g/dL — ABNORMAL LOW (ref 13.0–17.0)
MCH: 26.7 pg (ref 26.0–34.0)
MCHC: 29.6 g/dL — AB (ref 30.0–36.0)
MCV: 90.3 fL (ref 78.0–100.0)
PLATELETS: 405 10*3/uL — AB (ref 150–400)
RBC: 4.34 MIL/uL (ref 4.22–5.81)
RDW: 16 % — AB (ref 11.5–15.5)
WBC: 23 10*3/uL — ABNORMAL HIGH (ref 4.0–10.5)

## 2017-08-09 LAB — PHOSPHORUS: Phosphorus: 3.2 mg/dL (ref 2.5–4.6)

## 2017-08-10 LAB — URINE CULTURE
CULTURE: NO GROWTH
SPECIAL REQUESTS: NORMAL

## 2017-08-10 LAB — VANCOMYCIN, TROUGH: Vancomycin Tr: 11 ug/mL — ABNORMAL LOW (ref 15–20)

## 2017-08-11 LAB — CBC
HCT: 33.9 % — ABNORMAL LOW (ref 39.0–52.0)
Hemoglobin: 9.9 g/dL — ABNORMAL LOW (ref 13.0–17.0)
MCH: 26.6 pg (ref 26.0–34.0)
MCHC: 29.2 g/dL — ABNORMAL LOW (ref 30.0–36.0)
MCV: 91.1 fL (ref 78.0–100.0)
PLATELETS: 336 10*3/uL (ref 150–400)
RBC: 3.72 MIL/uL — ABNORMAL LOW (ref 4.22–5.81)
RDW: 16.3 % — AB (ref 11.5–15.5)
WBC: 10.5 10*3/uL (ref 4.0–10.5)

## 2017-08-11 LAB — CULTURE, RESPIRATORY W GRAM STAIN: Special Requests: NORMAL

## 2017-08-11 LAB — BASIC METABOLIC PANEL
Anion gap: 10 (ref 5–15)
BUN: 39 mg/dL — ABNORMAL HIGH (ref 6–20)
CALCIUM: 8.5 mg/dL — AB (ref 8.9–10.3)
CO2: 24 mmol/L (ref 22–32)
CREATININE: 0.44 mg/dL — AB (ref 0.61–1.24)
Chloride: 115 mmol/L — ABNORMAL HIGH (ref 101–111)
GFR calc Af Amer: >60 mL/min (ref >60)
GLUCOSE: 134 mg/dL — AB (ref 65–99)
Potassium: 3.4 mmol/L — ABNORMAL LOW (ref 3.5–5.1)
Sodium: 149 mmol/L — ABNORMAL HIGH (ref 135–145)

## 2017-08-11 LAB — MAGNESIUM: MAGNESIUM: 2 mg/dL (ref 1.7–2.4)

## 2017-08-11 LAB — CULTURE, RESPIRATORY

## 2017-08-11 LAB — PHOSPHORUS: Phosphorus: 3.7 mg/dL (ref 2.5–4.6)

## 2017-08-12 LAB — BASIC METABOLIC PANEL
ANION GAP: 11 (ref 5–15)
BUN: 31 mg/dL — ABNORMAL HIGH (ref 6–20)
CO2: 22 mmol/L (ref 22–32)
Calcium: 8.4 mg/dL — ABNORMAL LOW (ref 8.9–10.3)
Chloride: 111 mmol/L (ref 101–111)
Creatinine, Ser: 0.42 mg/dL — ABNORMAL LOW (ref 0.61–1.24)
Glucose, Bld: 109 mg/dL — ABNORMAL HIGH (ref 65–99)
POTASSIUM: 3.6 mmol/L (ref 3.5–5.1)
SODIUM: 144 mmol/L (ref 135–145)

## 2017-08-14 LAB — CULTURE, BLOOD (ROUTINE X 2)
CULTURE: NO GROWTH
Culture: NO GROWTH
Special Requests: ADEQUATE
Special Requests: ADEQUATE

## 2017-08-17 DIAGNOSIS — R491 Aphonia: Secondary | ICD-10-CM | POA: Diagnosis not present

## 2017-08-17 DIAGNOSIS — G629 Polyneuropathy, unspecified: Secondary | ICD-10-CM | POA: Diagnosis present

## 2017-08-17 DIAGNOSIS — I69351 Hemiplegia and hemiparesis following cerebral infarction affecting right dominant side: Secondary | ICD-10-CM | POA: Diagnosis not present

## 2017-08-17 DIAGNOSIS — Z931 Gastrostomy status: Secondary | ICD-10-CM | POA: Diagnosis not present

## 2017-08-17 DIAGNOSIS — R061 Stridor: Secondary | ICD-10-CM | POA: Diagnosis not present

## 2017-08-17 DIAGNOSIS — D649 Anemia, unspecified: Secondary | ICD-10-CM | POA: Diagnosis not present

## 2017-08-17 DIAGNOSIS — J9620 Acute and chronic respiratory failure, unspecified whether with hypoxia or hypercapnia: Secondary | ICD-10-CM | POA: Diagnosis not present

## 2017-08-17 DIAGNOSIS — R41841 Cognitive communication deficit: Secondary | ICD-10-CM | POA: Diagnosis not present

## 2017-08-17 DIAGNOSIS — M6281 Muscle weakness (generalized): Secondary | ICD-10-CM | POA: Diagnosis not present

## 2017-08-17 DIAGNOSIS — B957 Other staphylococcus as the cause of diseases classified elsewhere: Secondary | ICD-10-CM | POA: Diagnosis present

## 2017-08-17 DIAGNOSIS — E43 Unspecified severe protein-calorie malnutrition: Secondary | ICD-10-CM | POA: Diagnosis present

## 2017-08-17 DIAGNOSIS — J9690 Respiratory failure, unspecified, unspecified whether with hypoxia or hypercapnia: Secondary | ICD-10-CM | POA: Diagnosis not present

## 2017-08-17 DIAGNOSIS — Z9911 Dependence on respirator [ventilator] status: Secondary | ICD-10-CM | POA: Diagnosis not present

## 2017-08-17 DIAGNOSIS — J1520 Pneumonia due to staphylococcus, unspecified: Secondary | ICD-10-CM | POA: Diagnosis not present

## 2017-08-17 DIAGNOSIS — E782 Mixed hyperlipidemia: Secondary | ICD-10-CM | POA: Diagnosis present

## 2017-08-17 DIAGNOSIS — R000 Tachycardia, unspecified: Secondary | ICD-10-CM | POA: Diagnosis not present

## 2017-08-17 DIAGNOSIS — L89153 Pressure ulcer of sacral region, stage 3: Secondary | ICD-10-CM | POA: Diagnosis not present

## 2017-08-17 DIAGNOSIS — I10 Essential (primary) hypertension: Secondary | ICD-10-CM | POA: Diagnosis not present

## 2017-08-17 DIAGNOSIS — A419 Sepsis, unspecified organism: Secondary | ICD-10-CM | POA: Diagnosis not present

## 2017-08-17 DIAGNOSIS — Z430 Encounter for attention to tracheostomy: Secondary | ICD-10-CM | POA: Diagnosis not present

## 2017-08-17 DIAGNOSIS — E785 Hyperlipidemia, unspecified: Secondary | ICD-10-CM | POA: Diagnosis not present

## 2017-08-17 DIAGNOSIS — Z87891 Personal history of nicotine dependence: Secondary | ICD-10-CM | POA: Diagnosis not present

## 2017-08-17 DIAGNOSIS — J690 Pneumonitis due to inhalation of food and vomit: Secondary | ICD-10-CM | POA: Diagnosis not present

## 2017-08-17 DIAGNOSIS — E46 Unspecified protein-calorie malnutrition: Secondary | ICD-10-CM | POA: Diagnosis not present

## 2017-08-17 DIAGNOSIS — Z8673 Personal history of transient ischemic attack (TIA), and cerebral infarction without residual deficits: Secondary | ICD-10-CM | POA: Diagnosis not present

## 2017-08-17 DIAGNOSIS — Z6822 Body mass index (BMI) 22.0-22.9, adult: Secondary | ICD-10-CM | POA: Diagnosis not present

## 2017-08-17 DIAGNOSIS — I82A11 Acute embolism and thrombosis of right axillary vein: Secondary | ICD-10-CM | POA: Diagnosis not present

## 2017-08-17 DIAGNOSIS — I2510 Atherosclerotic heart disease of native coronary artery without angina pectoris: Secondary | ICD-10-CM | POA: Diagnosis not present

## 2017-08-17 DIAGNOSIS — Z515 Encounter for palliative care: Secondary | ICD-10-CM | POA: Diagnosis not present

## 2017-08-17 DIAGNOSIS — M4322 Fusion of spine, cervical region: Secondary | ICD-10-CM | POA: Diagnosis not present

## 2017-08-17 DIAGNOSIS — I82621 Acute embolism and thrombosis of deep veins of right upper extremity: Secondary | ICD-10-CM | POA: Diagnosis not present

## 2017-08-17 DIAGNOSIS — R402431 Glasgow coma scale score 3-8, in the field [EMT or ambulance]: Secondary | ICD-10-CM | POA: Diagnosis not present

## 2017-08-17 DIAGNOSIS — J9601 Acute respiratory failure with hypoxia: Secondary | ICD-10-CM | POA: Diagnosis not present

## 2017-08-17 DIAGNOSIS — G9341 Metabolic encephalopathy: Secondary | ICD-10-CM | POA: Diagnosis present

## 2017-08-17 DIAGNOSIS — B370 Candidal stomatitis: Secondary | ICD-10-CM | POA: Diagnosis not present

## 2017-08-17 DIAGNOSIS — R0682 Tachypnea, not elsewhere classified: Secondary | ICD-10-CM | POA: Diagnosis not present

## 2017-08-17 DIAGNOSIS — J181 Lobar pneumonia, unspecified organism: Secondary | ICD-10-CM | POA: Diagnosis not present

## 2017-08-17 DIAGNOSIS — R0902 Hypoxemia: Secondary | ICD-10-CM | POA: Diagnosis not present

## 2017-08-17 DIAGNOSIS — J9621 Acute and chronic respiratory failure with hypoxia: Secondary | ICD-10-CM | POA: Diagnosis not present

## 2017-08-17 DIAGNOSIS — A412 Sepsis due to unspecified staphylococcus: Secondary | ICD-10-CM | POA: Diagnosis not present

## 2017-08-17 DIAGNOSIS — R1312 Dysphagia, oropharyngeal phase: Secondary | ICD-10-CM | POA: Diagnosis not present

## 2017-08-17 DIAGNOSIS — M109 Gout, unspecified: Secondary | ICD-10-CM | POA: Diagnosis present

## 2017-08-17 DIAGNOSIS — J189 Pneumonia, unspecified organism: Secondary | ICD-10-CM | POA: Diagnosis not present

## 2017-08-17 DIAGNOSIS — J95851 Ventilator associated pneumonia: Secondary | ICD-10-CM | POA: Diagnosis not present

## 2017-08-17 DIAGNOSIS — E440 Moderate protein-calorie malnutrition: Secondary | ICD-10-CM | POA: Diagnosis not present

## 2017-08-17 DIAGNOSIS — Z7401 Bed confinement status: Secondary | ICD-10-CM | POA: Diagnosis not present

## 2017-08-17 DIAGNOSIS — R740 Nonspecific elevation of levels of transaminase and lactic acid dehydrogenase [LDH]: Secondary | ICD-10-CM | POA: Diagnosis not present

## 2017-08-17 DIAGNOSIS — S1093XA Contusion of unspecified part of neck, initial encounter: Secondary | ICD-10-CM | POA: Diagnosis present

## 2017-08-17 DIAGNOSIS — I609 Nontraumatic subarachnoid hemorrhage, unspecified: Secondary | ICD-10-CM | POA: Diagnosis not present

## 2017-08-17 DIAGNOSIS — Z930 Tracheostomy status: Secondary | ICD-10-CM | POA: Diagnosis not present

## 2017-08-17 DIAGNOSIS — J9600 Acute respiratory failure, unspecified whether with hypoxia or hypercapnia: Secondary | ICD-10-CM | POA: Diagnosis not present

## 2017-08-17 DIAGNOSIS — I615 Nontraumatic intracerebral hemorrhage, intraventricular: Secondary | ICD-10-CM | POA: Diagnosis not present

## 2017-08-17 DIAGNOSIS — A411 Sepsis due to other specified staphylococcus: Secondary | ICD-10-CM | POA: Diagnosis present

## 2017-08-17 DIAGNOSIS — M1990 Unspecified osteoarthritis, unspecified site: Secondary | ICD-10-CM | POA: Diagnosis present

## 2017-08-17 LAB — PROTIME-INR
INR: 1.2
Prothrombin Time: 15.1 seconds (ref 11.4–15.2)

## 2017-08-18 ENCOUNTER — Emergency Department (HOSPITAL_COMMUNITY): Payer: Medicare Other

## 2017-08-18 ENCOUNTER — Inpatient Hospital Stay (HOSPITAL_COMMUNITY): Payer: Medicare Other

## 2017-08-18 ENCOUNTER — Encounter (HOSPITAL_COMMUNITY): Payer: Self-pay | Admitting: Emergency Medicine

## 2017-08-18 ENCOUNTER — Inpatient Hospital Stay (HOSPITAL_COMMUNITY)
Admission: EM | Admit: 2017-08-18 | Discharge: 2017-08-27 | DRG: 871 | Disposition: A | Discharge status: Skilled Nursing Facility | Payer: Medicare Other | Attending: Internal Medicine | Admitting: Internal Medicine

## 2017-08-18 DIAGNOSIS — R0682 Tachypnea, not elsewhere classified: Secondary | ICD-10-CM | POA: Diagnosis not present

## 2017-08-18 DIAGNOSIS — I639 Cerebral infarction, unspecified: Secondary | ICD-10-CM | POA: Diagnosis present

## 2017-08-18 DIAGNOSIS — Z9911 Dependence on respirator [ventilator] status: Secondary | ICD-10-CM | POA: Diagnosis not present

## 2017-08-18 DIAGNOSIS — Z930 Tracheostomy status: Secondary | ICD-10-CM | POA: Diagnosis not present

## 2017-08-18 DIAGNOSIS — R7989 Other specified abnormal findings of blood chemistry: Secondary | ICD-10-CM | POA: Diagnosis not present

## 2017-08-18 DIAGNOSIS — Z885 Allergy status to narcotic agent status: Secondary | ICD-10-CM

## 2017-08-18 DIAGNOSIS — G9341 Metabolic encephalopathy: Secondary | ICD-10-CM | POA: Diagnosis present

## 2017-08-18 DIAGNOSIS — M1990 Unspecified osteoarthritis, unspecified site: Secondary | ICD-10-CM | POA: Diagnosis present

## 2017-08-18 DIAGNOSIS — Z87891 Personal history of nicotine dependence: Secondary | ICD-10-CM | POA: Diagnosis not present

## 2017-08-18 DIAGNOSIS — R491 Aphonia: Secondary | ICD-10-CM | POA: Diagnosis not present

## 2017-08-18 DIAGNOSIS — B957 Other staphylococcus as the cause of diseases classified elsewhere: Secondary | ICD-10-CM | POA: Diagnosis present

## 2017-08-18 DIAGNOSIS — R279 Unspecified lack of coordination: Secondary | ICD-10-CM | POA: Diagnosis not present

## 2017-08-18 DIAGNOSIS — E440 Moderate protein-calorie malnutrition: Secondary | ICD-10-CM | POA: Diagnosis not present

## 2017-08-18 DIAGNOSIS — K828 Other specified diseases of gallbladder: Secondary | ICD-10-CM | POA: Diagnosis not present

## 2017-08-18 DIAGNOSIS — M6281 Muscle weakness (generalized): Secondary | ICD-10-CM | POA: Diagnosis not present

## 2017-08-18 DIAGNOSIS — J1520 Pneumonia due to staphylococcus, unspecified: Secondary | ICD-10-CM | POA: Diagnosis not present

## 2017-08-18 DIAGNOSIS — E782 Mixed hyperlipidemia: Secondary | ICD-10-CM | POA: Diagnosis present

## 2017-08-18 DIAGNOSIS — J9620 Acute and chronic respiratory failure, unspecified whether with hypoxia or hypercapnia: Secondary | ICD-10-CM | POA: Diagnosis not present

## 2017-08-18 DIAGNOSIS — I2510 Atherosclerotic heart disease of native coronary artery without angina pectoris: Secondary | ICD-10-CM | POA: Diagnosis not present

## 2017-08-18 DIAGNOSIS — E43 Unspecified severe protein-calorie malnutrition: Secondary | ICD-10-CM | POA: Diagnosis present

## 2017-08-18 DIAGNOSIS — G20 Parkinson's disease: Secondary | ICD-10-CM | POA: Diagnosis not present

## 2017-08-18 DIAGNOSIS — I82621 Acute embolism and thrombosis of deep veins of right upper extremity: Secondary | ICD-10-CM | POA: Diagnosis not present

## 2017-08-18 DIAGNOSIS — Z7982 Long term (current) use of aspirin: Secondary | ICD-10-CM

## 2017-08-18 DIAGNOSIS — R402431 Glasgow coma scale score 3-8, in the field [EMT or ambulance]: Secondary | ICD-10-CM | POA: Diagnosis not present

## 2017-08-18 DIAGNOSIS — R41841 Cognitive communication deficit: Secondary | ICD-10-CM | POA: Diagnosis not present

## 2017-08-18 DIAGNOSIS — G629 Polyneuropathy, unspecified: Secondary | ICD-10-CM | POA: Diagnosis present

## 2017-08-18 DIAGNOSIS — R600 Localized edema: Secondary | ICD-10-CM | POA: Diagnosis not present

## 2017-08-18 DIAGNOSIS — Z79899 Other long term (current) drug therapy: Secondary | ICD-10-CM

## 2017-08-18 DIAGNOSIS — R061 Stridor: Secondary | ICD-10-CM | POA: Diagnosis not present

## 2017-08-18 DIAGNOSIS — J9690 Respiratory failure, unspecified, unspecified whether with hypoxia or hypercapnia: Secondary | ICD-10-CM

## 2017-08-18 DIAGNOSIS — S1093XA Contusion of unspecified part of neck, initial encounter: Secondary | ICD-10-CM | POA: Diagnosis present

## 2017-08-18 DIAGNOSIS — R740 Nonspecific elevation of levels of transaminase and lactic acid dehydrogenase [LDH]: Secondary | ICD-10-CM | POA: Diagnosis not present

## 2017-08-18 DIAGNOSIS — Z7901 Long term (current) use of anticoagulants: Secondary | ICD-10-CM

## 2017-08-18 DIAGNOSIS — I609 Nontraumatic subarachnoid hemorrhage, unspecified: Secondary | ICD-10-CM | POA: Diagnosis not present

## 2017-08-18 DIAGNOSIS — Z931 Gastrostomy status: Secondary | ICD-10-CM

## 2017-08-18 DIAGNOSIS — Z515 Encounter for palliative care: Secondary | ICD-10-CM | POA: Diagnosis not present

## 2017-08-18 DIAGNOSIS — A419 Sepsis, unspecified organism: Secondary | ICD-10-CM

## 2017-08-18 DIAGNOSIS — L89153 Pressure ulcer of sacral region, stage 3: Secondary | ICD-10-CM | POA: Diagnosis not present

## 2017-08-18 DIAGNOSIS — I10 Essential (primary) hypertension: Secondary | ICD-10-CM | POA: Diagnosis not present

## 2017-08-18 DIAGNOSIS — E785 Hyperlipidemia, unspecified: Secondary | ICD-10-CM | POA: Diagnosis not present

## 2017-08-18 DIAGNOSIS — R000 Tachycardia, unspecified: Secondary | ICD-10-CM | POA: Diagnosis not present

## 2017-08-18 DIAGNOSIS — A411 Sepsis due to other specified staphylococcus: Secondary | ICD-10-CM | POA: Diagnosis not present

## 2017-08-18 DIAGNOSIS — B370 Candidal stomatitis: Secondary | ICD-10-CM | POA: Diagnosis not present

## 2017-08-18 DIAGNOSIS — M109 Gout, unspecified: Secondary | ICD-10-CM | POA: Diagnosis present

## 2017-08-18 DIAGNOSIS — R0902 Hypoxemia: Secondary | ICD-10-CM | POA: Diagnosis not present

## 2017-08-18 DIAGNOSIS — F339 Major depressive disorder, recurrent, unspecified: Secondary | ICD-10-CM | POA: Diagnosis not present

## 2017-08-18 DIAGNOSIS — Z981 Arthrodesis status: Secondary | ICD-10-CM

## 2017-08-18 DIAGNOSIS — A412 Sepsis due to unspecified staphylococcus: Secondary | ICD-10-CM

## 2017-08-18 DIAGNOSIS — Z8673 Personal history of transient ischemic attack (TIA), and cerebral infarction without residual deficits: Secondary | ICD-10-CM | POA: Diagnosis not present

## 2017-08-18 DIAGNOSIS — R7401 Elevation of levels of liver transaminase levels: Secondary | ICD-10-CM

## 2017-08-18 DIAGNOSIS — J189 Pneumonia, unspecified organism: Secondary | ICD-10-CM

## 2017-08-18 DIAGNOSIS — Z430 Encounter for attention to tracheostomy: Secondary | ICD-10-CM | POA: Diagnosis not present

## 2017-08-18 DIAGNOSIS — J181 Lobar pneumonia, unspecified organism: Secondary | ICD-10-CM

## 2017-08-18 DIAGNOSIS — J9601 Acute respiratory failure with hypoxia: Secondary | ICD-10-CM

## 2017-08-18 DIAGNOSIS — Z6822 Body mass index (BMI) 22.0-22.9, adult: Secondary | ICD-10-CM | POA: Diagnosis not present

## 2017-08-18 DIAGNOSIS — Z7401 Bed confinement status: Secondary | ICD-10-CM | POA: Diagnosis not present

## 2017-08-18 DIAGNOSIS — Z888 Allergy status to other drugs, medicaments and biological substances status: Secondary | ICD-10-CM

## 2017-08-18 DIAGNOSIS — J690 Pneumonitis due to inhalation of food and vomit: Secondary | ICD-10-CM | POA: Diagnosis not present

## 2017-08-18 DIAGNOSIS — Z8674 Personal history of sudden cardiac arrest: Secondary | ICD-10-CM

## 2017-08-18 DIAGNOSIS — I69351 Hemiplegia and hemiparesis following cerebral infarction affecting right dominant side: Secondary | ICD-10-CM | POA: Diagnosis not present

## 2017-08-18 DIAGNOSIS — J9600 Acute respiratory failure, unspecified whether with hypoxia or hypercapnia: Secondary | ICD-10-CM | POA: Diagnosis not present

## 2017-08-18 DIAGNOSIS — Z9181 History of falling: Secondary | ICD-10-CM

## 2017-08-18 DIAGNOSIS — R1312 Dysphagia, oropharyngeal phase: Secondary | ICD-10-CM | POA: Diagnosis not present

## 2017-08-18 DIAGNOSIS — M79603 Pain in arm, unspecified: Secondary | ICD-10-CM

## 2017-08-18 DIAGNOSIS — J9621 Acute and chronic respiratory failure with hypoxia: Secondary | ICD-10-CM | POA: Diagnosis not present

## 2017-08-18 DIAGNOSIS — I82A11 Acute embolism and thrombosis of right axillary vein: Secondary | ICD-10-CM | POA: Diagnosis not present

## 2017-08-18 DIAGNOSIS — Z86718 Personal history of other venous thrombosis and embolism: Secondary | ICD-10-CM

## 2017-08-18 DIAGNOSIS — Z431 Encounter for attention to gastrostomy: Secondary | ICD-10-CM | POA: Diagnosis not present

## 2017-08-18 LAB — COMPREHENSIVE METABOLIC PANEL
ALK PHOS: 414 U/L — AB (ref 38–126)
ALT: 97 U/L — AB (ref 17–63)
ANION GAP: 10 (ref 5–15)
AST: 55 U/L — ABNORMAL HIGH (ref 15–41)
Albumin: 2.3 g/dL — ABNORMAL LOW (ref 3.5–5.0)
BUN: 39 mg/dL — ABNORMAL HIGH (ref 6–20)
CALCIUM: 9 mg/dL (ref 8.9–10.3)
CO2: 26 mmol/L (ref 22–32)
CREATININE: 0.54 mg/dL — AB (ref 0.61–1.24)
Chloride: 109 mmol/L (ref 101–111)
GFR calc Af Amer: >60 mL/min (ref >60)
Glucose, Bld: 147 mg/dL — ABNORMAL HIGH (ref 65–99)
Potassium: 4.1 mmol/L (ref 3.5–5.1)
SODIUM: 145 mmol/L (ref 135–145)
Total Bilirubin: 0.6 mg/dL (ref 0.3–1.2)
Total Protein: 7.3 g/dL (ref 6.5–8.1)

## 2017-08-18 LAB — CBC WITH DIFFERENTIAL/PLATELET
BASOS ABS: 0.1 10*3/uL (ref 0.0–0.1)
BASOS PCT: 0 %
EOS ABS: 0.1 10*3/uL (ref 0.0–0.7)
EOS PCT: 1 %
HCT: 39.7 % (ref 39.0–52.0)
HEMOGLOBIN: 11.8 g/dL — AB (ref 13.0–17.0)
Lymphocytes Relative: 7 %
Lymphs Abs: 1.3 10*3/uL (ref 0.7–4.0)
MCH: 26 pg (ref 26.0–34.0)
MCHC: 29.7 g/dL — ABNORMAL LOW (ref 30.0–36.0)
MCV: 87.6 fL (ref 78.0–100.0)
Monocytes Absolute: 1.1 10*3/uL — ABNORMAL HIGH (ref 0.1–1.0)
Monocytes Relative: 6 %
NEUTROS PCT: 86 %
Neutro Abs: 16.9 10*3/uL (ref 1.7–7.7)
PLATELETS: 348 10*3/uL (ref 150–400)
RBC: 4.53 MIL/uL (ref 4.22–5.81)
RDW: 16.3 % — ABNORMAL HIGH (ref 11.5–15.5)
WBC: 19.4 10*3/uL — AB (ref 4.0–10.5)

## 2017-08-18 LAB — URINALYSIS, ROUTINE W REFLEX MICROSCOPIC
BILIRUBIN URINE: NEGATIVE
Bacteria, UA: NONE SEEN
GLUCOSE, UA: NEGATIVE mg/dL
Ketones, ur: NEGATIVE mg/dL
LEUKOCYTES UA: NEGATIVE
NITRITE: NEGATIVE
PH: 6 (ref 5.0–8.0)
Protein, ur: 30 mg/dL — AB
SPECIFIC GRAVITY, URINE: 1.025 (ref 1.005–1.030)

## 2017-08-18 LAB — PROTIME-INR
INR: 1.37
PROTHROMBIN TIME: 16.7 s — AB (ref 11.4–15.2)

## 2017-08-18 LAB — I-STAT CG4 LACTIC ACID, ED: LACTIC ACID, VENOUS: 1.88 mmol/L (ref 0.5–1.9)

## 2017-08-18 MED ORDER — ACETAMINOPHEN 650 MG RE SUPP
650.0000 mg | Freq: Four times a day (QID) | RECTAL | Status: DC | PRN
  Administered 2017-08-19: 650 mg via RECTAL
  Filled 2017-08-18: qty 1

## 2017-08-18 MED ORDER — LACTATED RINGERS IV SOLN
INTRAVENOUS | Status: DC
  Administered 2017-08-19: 02:00:00 via INTRAVENOUS

## 2017-08-18 MED ORDER — ACETAMINOPHEN 325 MG PO TABS
650.0000 mg | ORAL_TABLET | Freq: Four times a day (QID) | ORAL | Status: DC | PRN
  Administered 2017-08-20 – 2017-08-21 (×3): 650 mg via ORAL
  Filled 2017-08-18 (×3): qty 2

## 2017-08-18 MED ORDER — POLYETHYLENE GLYCOL 3350 17 GM/SCOOP PO POWD
17.0000 g | Freq: Every day | ORAL | Status: DC
  Filled 2017-08-18: qty 255

## 2017-08-18 MED ORDER — AMANTADINE HCL 100 MG PO CAPS
100.0000 mg | ORAL_CAPSULE | Freq: Two times a day (BID) | ORAL | Status: DC
  Administered 2017-08-19 – 2017-08-27 (×17): 100 mg
  Filled 2017-08-18 (×22): qty 1

## 2017-08-18 MED ORDER — MODAFINIL 200 MG PO TABS
100.0000 mg | ORAL_TABLET | Freq: Every day | ORAL | Status: DC
  Administered 2017-08-19: 100 mg
  Filled 2017-08-18: qty 1

## 2017-08-18 MED ORDER — PIPERACILLIN-TAZOBACTAM 3.375 G IVPB
3.3750 g | Freq: Three times a day (TID) | INTRAVENOUS | Status: DC
  Administered 2017-08-19: 3.375 g via INTRAVENOUS
  Filled 2017-08-18: qty 50

## 2017-08-18 MED ORDER — FAMOTIDINE 20 MG PO TABS
20.0000 mg | ORAL_TABLET | Freq: Two times a day (BID) | ORAL | Status: DC
  Administered 2017-08-19 – 2017-08-27 (×17): 20 mg
  Filled 2017-08-18 (×17): qty 1

## 2017-08-18 MED ORDER — VANCOMYCIN HCL IN DEXTROSE 1-5 GM/200ML-% IV SOLN
1000.0000 mg | Freq: Once | INTRAVENOUS | Status: AC
  Administered 2017-08-18: 1000 mg via INTRAVENOUS
  Filled 2017-08-18: qty 200

## 2017-08-18 MED ORDER — SERTRALINE HCL 50 MG PO TABS
50.0000 mg | ORAL_TABLET | Freq: Every day | ORAL | Status: DC
  Administered 2017-08-19 – 2017-08-27 (×9): 50 mg
  Filled 2017-08-18 (×9): qty 1

## 2017-08-18 MED ORDER — ONDANSETRON HCL 4 MG PO TABS: 4.0000 mg | ORAL_TABLET | Freq: Four times a day (QID) | ORAL | Status: DC | PRN

## 2017-08-18 MED ORDER — SODIUM CHLORIDE 0.9 % IV BOLUS (SEPSIS)
1000.0000 mL | Freq: Once | INTRAVENOUS | Status: AC
  Administered 2017-08-18: 1000 mL via INTRAVENOUS

## 2017-08-18 MED ORDER — ALLOPURINOL 100 MG PO TABS
100.0000 mg | ORAL_TABLET | Freq: Every day | ORAL | Status: DC
Start: 2017-08-19 — End: 2017-08-27
  Administered 2017-08-19 – 2017-08-27 (×9): 100 mg
  Filled 2017-08-18 (×11): qty 1

## 2017-08-18 MED ORDER — OMEGA-3-ACID ETHYL ESTERS 1 G PO CAPS
1.0000 g | ORAL_CAPSULE | Freq: Every day | ORAL | Status: DC
  Administered 2017-08-19 – 2017-08-27 (×9): 1 g
  Filled 2017-08-18 (×11): qty 1

## 2017-08-18 MED ORDER — PIPERACILLIN-TAZOBACTAM 3.375 G IVPB 30 MIN
3.3750 g | Freq: Once | INTRAVENOUS | Status: AC
  Administered 2017-08-18: 3.375 g via INTRAVENOUS
  Filled 2017-08-18: qty 50

## 2017-08-18 MED ORDER — METOPROLOL TARTRATE 25 MG PO TABS: 37.5000 mg | ORAL_TABLET | Freq: Every day | ORAL | Status: DC

## 2017-08-18 MED ORDER — VANCOMYCIN HCL IN DEXTROSE 1-5 GM/200ML-% IV SOLN
1000.0000 mg | Freq: Once | INTRAVENOUS | Status: AC
  Administered 2017-08-19: 1000 mg via INTRAVENOUS
  Filled 2017-08-18: qty 200

## 2017-08-18 MED ORDER — COLLAGENASE 250 UNIT/GM EX OINT
1.0000 "application " | TOPICAL_OINTMENT | Freq: Every day | CUTANEOUS | Status: DC
  Administered 2017-08-19 – 2017-08-26 (×8): 1 via TOPICAL
  Filled 2017-08-18 (×2): qty 30

## 2017-08-18 MED ORDER — TAMSULOSIN HCL 0.4 MG PO CAPS
0.4000 mg | ORAL_CAPSULE | Freq: Every day | ORAL | Status: DC
  Administered 2017-08-19 – 2017-08-27 (×9): 0.4 mg via ORAL
  Filled 2017-08-18 (×9): qty 1

## 2017-08-18 MED ORDER — APIXABAN 2.5 MG PO TABS
2.5000 mg | ORAL_TABLET | Freq: Two times a day (BID) | ORAL | Status: DC
  Administered 2017-08-19 – 2017-08-27 (×17): 2.5 mg
  Filled 2017-08-18 (×20): qty 1

## 2017-08-18 MED ORDER — ONDANSETRON HCL 4 MG/2ML IJ SOLN: 4.0000 mg | Freq: Four times a day (QID) | INTRAMUSCULAR | Status: DC | PRN

## 2017-08-18 NOTE — H&P (Addendum)
History and Physical    JEB SCHLOEMER ZOX:[id number] DOB: 1940-09-17 DOA: 08/18/2017  PCP: Kirstie Peri, MD   Patient coming from: SNF; [facility] in [place]  Chief Complaint: Fever, tachypnea, and decreased O2 saturation  HPI: Richard Mcpherson is a 77 y.o. male with medical history significant for prior CVA on Eliquis  with subsequent tracheostomy and PEG tube placement, hypertension, dyslipidemia, gout, and heel and sacral decubitus ulcers who was brought to the ED due to fever, tachypnea, and decreased oxygen saturation in the 70th percentile noted at his skilled nursing facility.  He is currently level 5 caveat and is otherwise nonverbal.   ED Course: Fluid bolus is currently underway and vital signs appear to be stable.  He is febrile with a temperature of 102F.  Laboratory data demonstrates leukocytosis with count of 19,400 and lactic acid level is 1.88.  Chest x-ray demonstrates left lower lobe consolidation suggestive of pneumonia.  He is only able to open his eyes to verbal command and is currently on 15 L oxygen via nonrebreather over his tracheostomy with saturation in the low 90th percentile.  He has been started on vancomycin and Zosyn and cultures have been obtained.  Review of Systems: Cannot be obtained due to patient's condition.  Past Medical History:  Diagnosis Date  . Atrial premature beats   . Blood in stool   . Cerebral aneurysm   . Coronary atherosclerosis of native coronary artery   . Embolism (HCC)    History of right lower extremity distal embolism   . Gout   . Hyperglycemia   . Hyperlipidemia, mixed   . Osteoarthritis   . Plantar fasciitis   . Sinusitis   . Stroke (HCC)   . Subarachnoid hemorrhage (HCC)   . Unspecified essential hypertension     Past Surgical History:  Procedure Laterality Date  . APPENDECTOMY    . cardiac catherization    . CATARACT EXTRACTION W/PHACO Right 02/19/2014   Procedure: CATARACT EXTRACTION PHACO AND INTRAOCULAR LENS PLACEMENT  (IOC);  Surgeon: Gemma Payor, MD;  Location: [facility];  Service: Ophthalmology;  Laterality: Right;  CDE 12.71  . CATARACT EXTRACTION W/PHACO Left 12/28/2014   Procedure: CATARACT EXTRACTION PHACO AND INTRAOCULAR LENS PLACEMENT (IOC);  Surgeon: Gemma Payor, MD;  Location: [facility];  Service: Ophthalmology;  Laterality: Left;  CDE:8.05  . embolectomy 2000    . IR GASTROSTOMY TUBE MOD SED  08/06/2017     reports that he has quit smoking. He has a 40.00 pack-year smoking history. His smokeless tobacco use includes snuff. He reports that he drinks alcohol. He reports that he does not use drugs.  Allergies  Allergen Reactions  . Codeine Nausea Only and Other (See Comments)  . Crestor [Rosuvastatin Calcium] Other (See Comments)    bodyache  . Lipitor [Atorvastatin] Other (See Comments)    Body aches  . Allopurinol Rash    Family History  Problem Relation Age of Onset  . Cancer Mother     Prior to Admission medications   Not on File    Physical Exam: Vitals:   08/18/17 2144 08/18/17 2200 08/18/17 2230 08/18/17 2300  BP:  119/71 121/62 116/62  Pulse:  (!) 124 (!) 126 (!) 122  Resp:  (!) 44 (!) 43 (!) 41  Temp: (!) 102.5 F (39.2 C)     TempSrc: Rectal     SpO2:  90% 90% (!) 89%  Weight:      Height:        Constitutional:  NAD, calm, comfortable Vitals:   08/18/17 2144 08/18/17 2200 08/18/17 2230 08/18/17 2300  BP:  119/71 121/62 116/62  Pulse:  (!) 124 (!) 126 (!) 122  Resp:  (!) 44 (!) 43 (!) 41  Temp: (!) 102.5 F (39.2 C)     TempSrc: Rectal     SpO2:  90% 90% (!) 89%  Weight:      Height:       Eyes: lids and conjunctivae normal ENMT: Mucous membranes are dry with poor dentition Neck: normal, supple; tracheostomy; cervical collar Respiratory: clear to auscultation bilaterally. Diminished to LLL base. Normal respiratory effort. No accessory muscle use. Trach with NRB at 15L/min. Cardiovascular: Regular rate and rhythm, no murmurs. No extremity edema.Tachycardic Abdomen:  no tenderness, no distention. Bowel sounds positive.  Musculoskeletal:  No joint deformity upper and lower extremities.   Skin: decubitus ulcers with ulcers over heels. Psychiatric: Normal judgment and insight. Alert and oriented x 3. Normal mood.   Labs on Admission: I have personally reviewed following labs and imaging studies  CBC: Recent Labs  Lab 08/18/17 2145  WBC 19.4*  NEUTROABS 16.9  HGB 11.8*  HCT 39.7  MCV 87.6  PLT 348   Basic Metabolic Panel: Recent Labs  Lab 08/12/17 0801 08/18/17 2145  NA 144 145  K 3.6 4.1  CL 111 109  CO2 22 26  GLUCOSE 109* 147*  BUN 31* 39*  CREATININE 0.42* 0.54*  CALCIUM 8.4* 9.0   GFR: Estimated Creatinine Clearance: 84.9 mL/min (A) (by C-G formula based on SCr of 0.54 mg/dL (L)). Liver Function Tests: Recent Labs  Lab 08/18/17 2145  AST 55*  ALT 97*  ALKPHOS 414*  BILITOT 0.6  PROT 7.3  ALBUMIN 2.3*   No results for input(s): LIPASE, AMYLASE in the last 168 hours. No results for input(s): AMMONIA in the last 168 hours. Coagulation Profile: Recent Labs  Lab 08/17/17 0548 08/18/17 2145  INR 1.20 1.37   Cardiac Enzymes: No results for input(s): CKTOTAL, CKMB, CKMBINDEX, TROPONINI in the last 168 hours. BNP (last 3 results) No results for input(s): PROBNP in the last 8760 hours. HbA1C: No results for input(s): HGBA1C in the last 72 hours. CBG: No results for input(s): GLUCAP in the last 168 hours. Lipid Profile: No results for input(s): CHOL, HDL, LDLCALC, TRIG, CHOLHDL, LDLDIRECT in the last 72 hours. Thyroid Function Tests: No results for input(s): TSH, T4TOTAL, FREET4, T3FREE, THYROIDAB in the last 72 hours. Anemia Panel: No results for input(s): VITAMINB12, FOLATE, FERRITIN, TIBC, IRON, RETICCTPCT in the last 72 hours. Urine analysis:    Component Value Date/Time   COLORURINE AMBER (A) 08/18/2017 2146   APPEARANCEUR HAZY (A) 08/18/2017 2146   LABSPEC 1.025 08/18/2017 2146   PHURINE 6.0 08/18/2017 2146    GLUCOSEU NEGATIVE 08/18/2017 2146   HGBUR MODERATE (A) 08/18/2017 2146   BILIRUBINUR NEGATIVE 08/18/2017 2146   KETONESUR NEGATIVE 08/18/2017 2146   PROTEINUR 30 (A) 08/18/2017 2146   NITRITE NEGATIVE 08/18/2017 2146   LEUKOCYTESUR NEGATIVE 08/18/2017 2146    Radiological Exams on Admission: Dg Chest Portable 1 View  Result Date: 08/18/2017 CLINICAL DATA:  Chronic ventilator dependent respiratory failure, presenting with fever, tachycardia, tachypnea and hypoxia. Personal history of stroke. EXAM: PORTABLE CHEST 1 VIEW COMPARISON:  07/22/2017, 11/23/2016 and earlier. FINDINGS: Tracheostomy tube tip in satisfactory position below the thoracic inlet. Cardiac silhouette upper normal in size for [facility] portable technique, unchanged. Dense airspace consolidation involving the left lower lobe. Lungs otherwise clear. Mild pulmonary venous hypertension without  overt edema. IMPRESSION: 1. Dense left lower lobe pneumonia and/or atelectasis. 2. 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 Electronically Signed   By: Hulan Saas M.D.   On: 08/18/2017 22:15    Assessment/Plan Principal Problem:   Sepsis (HCC) Active Problems:   Hyperlipidemia   Essential hypertension   CAD, NATIVE VESSEL   Stroke (cerebrum) (HCC)   Pneumonia   Acute hypoxemic respiratory failure (HCC)    1. Sepsis secondary to left lower lobe pneumonia.  Cultures have been obtained.  Respiratory panel.  Agree with vancomycin and Zosyn for broad-spectrum coverage.  Complete 30 cc/kg bolus and repeat lactic acid level in a.m.  Maintain on IV fluid.  Tylenol as needed for fever. 2. Acute hypoxemic respiratory failure secondary to above.  Continue on current oxygen supplementation and monitor closely in ICU.  Duo nebs as needed for wheezing or shortness of breath.  Monitor for possible acute lung injury with developing ARDS.  Obtain ABG now and in a.m. along with repeat chest x-ray in a.m. Consider pulmonology evaluation and  possible transfer should this worsen. 3. Prior CVA with trach and PEG-nonverbal.  Monitor with neuro checks and continue prior feeds via PEG.  Trach care with respiratory. Continue Eliquis. 4. Hypertension.  Continue to monitor closely 5. Sacral and heel decubitus wounds. No active signs of infection currently. Will obtain wound care to evaluate.   DVT prophylaxis: On Eliquis Code Status: Full Family Communication: Spoke with wife Edson Fruth on phone Disposition Plan:SNF on DC when stable Consults called:None Admission status: Inpatient, ICU   Pratik Hoover Brunette DO [facility] Pager [phone]  If 7PM-7AM, please contact night-coverage [email or web address] Password [email or web address]  08/18/2017, 11:30 PM

## 2017-08-18 NOTE — ED Notes (Signed)
AC at bedside checking on Code Sepsis.

## 2017-08-18 NOTE — Progress Notes (Signed)
ANTIBIOTIC CONSULT NOTE-Preliminary  Pharmacy Consult for Vanc/Zosyn Indication: HCAP/Sepsis  Allergies  Allergen Reactions  . Codeine Nausea Only and Other (See Comments)  . Crestor [Rosuvastatin Calcium] Other (See Comments)    bodyache  . Lipitor [Atorvastatin] Other (See Comments)    Body aches  . Allopurinol Rash    Patient Measurements: Height: 6' (182.9 cm) Weight: 183 lb 14.4 oz (83.4 kg) IBW/kg (Calculated) : 77.6   Vital Signs: Temp: 102.5 F (39.2 C) (02/02 2144) Temp Source: Rectal (02/02 2144) BP: 116/62 (02/02 2300) Pulse Rate: 122 (02/02 2300)  Labs: Recent Labs    08/18/17 2145  WBC 19.4*  HGB 11.8*  PLT 348  CREATININE 0.54*    Estimated Creatinine Clearance: 84.9 mL/min (A) (by C-G formula based on SCr of 0.54 mg/dL (L)).  No results for input(s): VANCOTROUGH, VANCOPEAK, VANCORANDOM, GENTTROUGH, GENTPEAK, GENTRANDOM, TOBRATROUGH, TOBRAPEAK, TOBRARND, AMIKACINPEAK, AMIKACINTROU, AMIKACIN in the last 72 hours.   Microbiology: Recent Results (from the past 720 hour(s))  Culture, Urine     Status: None   Collection Time: 07/27/17  2:30 PM  Result Value Ref Range Status   Specimen Description URINE, RANDOM  Final   Special Requests NONE  Final   Culture NO GROWTH  Final   Report Status 07/28/2017 FINAL  Final  Culture, respiratory (NON-Expectorated)     Status: None   Collection Time: 07/28/17  9:25 AM  Result Value Ref Range Status   Specimen Description TRACHEAL ASPIRATE  Final   Special Requests NONE  Final   Gram Stain   Final    RARE WBC PRESENT, PREDOMINANTLY MONONUCLEAR NO SQUAMOUS EPITHELIAL CELLS SEEN RARE GRAM POSITIVE COCCI IN PAIRS    Culture FEW PSEUDOMONAS FLUORESCENS  Final   Report Status 08/01/2017 FINAL  Final   Organism ID, Bacteria PSEUDOMONAS FLUORESCENS  Final      Susceptibility   Pseudomonas fluorescens - MIC*    CEFTAZIDIME 4 SENSITIVE Sensitive     CIPROFLOXACIN <=0.25 SENSITIVE Sensitive     GENTAMICIN <=1  SENSITIVE Sensitive     IMIPENEM <=0.25 SENSITIVE Sensitive     PIP/TAZO <=4 SENSITIVE Sensitive     CEFEPIME 16 INTERMEDIATE Intermediate     * FEW PSEUDOMONAS FLUORESCENS  Culture, blood (routine x 2)     Status: None   Collection Time: 07/31/17  2:19 PM  Result Value Ref Range Status   Specimen Description BLOOD LEFT HAND  Final   Special Requests   Final    BOTTLES DRAWN AEROBIC AND ANAEROBIC Blood Culture adequate volume   Culture NO GROWTH 5 DAYS  Final   Report Status 08/05/2017 FINAL  Final  Culture, blood (routine x 2)     Status: None   Collection Time: 07/31/17  2:19 PM  Result Value Ref Range Status   Specimen Description BLOOD WRIST LEFT  Final   Special Requests   Final    BOTTLES DRAWN AEROBIC AND ANAEROBIC Blood Culture adequate volume   Culture NO GROWTH 5 DAYS  Final   Report Status 08/05/2017 FINAL  Final  Culture, Urine     Status: None   Collection Time: 08/09/17  8:24 AM  Result Value Ref Range Status   Specimen Description URINE, CLEAN CATCH  Final   Special Requests Normal  Final   Culture NO GROWTH  Final   Report Status 08/10/2017 FINAL  Final  Culture, respiratory (NON-Expectorated)     Status: None   Collection Time: 08/09/17  8:24 AM  Result Value Ref Range  Status   Specimen Description TRACHEAL ASPIRATE  Final   Special Requests Normal  Final   Gram Stain   Final    RARE WBC PRESENT, PREDOMINANTLY PMN MODERATE GRAM NEGATIVE RODS RARE GRAM POSITIVE COCCI IN PAIRS IN CLUSTERS RARE GRAM POSITIVE RODS    Culture MODERATE ENTEROBACTER AEROGENES  Final   Report Status 08/11/2017 FINAL  Final   Organism ID, Bacteria ENTEROBACTER AEROGENES  Final      Susceptibility   Enterobacter aerogenes - MIC*    CEFAZOLIN >=64 RESISTANT Resistant     CEFEPIME <=1 SENSITIVE Sensitive     CEFTAZIDIME <=1 SENSITIVE Sensitive     CEFTRIAXONE <=1 SENSITIVE Sensitive     CIPROFLOXACIN <=0.25 SENSITIVE Sensitive     GENTAMICIN <=1 SENSITIVE Sensitive     IMIPENEM  0.5 SENSITIVE Sensitive     TRIMETH/SULFA <=20 SENSITIVE Sensitive     PIP/TAZO <=4 SENSITIVE Sensitive     * MODERATE ENTEROBACTER AEROGENES  Culture, blood (routine x 2)     Status: None   Collection Time: 08/09/17 10:05 AM  Result Value Ref Range Status   Specimen Description BLOOD LEFT ARM  Final   Special Requests   Final    BOTTLES DRAWN AEROBIC AND ANAEROBIC Blood Culture adequate volume   Culture NO GROWTH 5 DAYS  Final   Report Status 08/14/2017 FINAL  Final  Culture, blood (routine x 2)     Status: None   Collection Time: 08/09/17 10:21 AM  Result Value Ref Range Status   Specimen Description BLOOD LEFT ARM  Final   Special Requests   Final    BOTTLES DRAWN AEROBIC AND ANAEROBIC Blood Culture adequate volume   Culture NO GROWTH 5 DAYS  Final   Report Status 08/14/2017 FINAL  Final  Culture, blood (Routine x 2)     Status: None (Preliminary result)   Collection Time: 08/18/17  9:42 PM  Result Value Ref Range Status   Specimen Description LEFT ANTECUBITAL  Final   Special Requests   Final    BOTTLES DRAWN AEROBIC AND ANAEROBIC Blood Culture adequate volume Performed at [facility], [address] [place], [place] [address]    Culture PENDING  Incomplete   Report Status PENDING  Incomplete  Culture, blood (Routine x 2)     Status: None (Preliminary result)   Collection Time: 08/18/17  9:51 PM  Result Value Ref Range Status   Specimen Description BLOOD LEFT FOREARM  Final   Special Requests   Final    BOTTLES DRAWN AEROBIC ONLY Blood Culture adequate volume Performed at [facility], [address], [place], [place] [address]    Culture PENDING  Incomplete   Report Status PENDING  Incomplete    Medical History: Past Medical History:  Diagnosis Date  . Atrial premature beats   . Blood in stool   . Cerebral aneurysm   . Coronary atherosclerosis of native coronary artery   . Embolism (HCC)    History of right lower extremity distal embolism   . Gout   .  Hyperglycemia   . Hyperlipidemia, mixed   . Osteoarthritis   . Plantar fasciitis   . Sinusitis   . Stroke (HCC)   . Subarachnoid hemorrhage (HCC)   . Unspecified essential hypertension     Medications:  Vancomycin 1 gram IV every 8 hours x 2 doses 2/2 at 2200 and 2/3 at 0600. Zosyn 3.375 IV every 8 hours x 2 doses 2/2 at 2200 and 2/3 at 0600.  Assessment: Patient sent from nursing home with fever of 101 axillary tachycardia tachypnea and oxygen sats in the 70s.  He is chronically trached and pegged. Pneumonia on x-ray.  Plan:  Preliminary review of pertinent patient information completed.  Protocol will be initiated with dose(s) of Vanc/Zosyn as stated above.  Jeani Hawking clinical pharmacist will complete review during morning rounds to assess patient and finalize treatment regimen if needed.  Hurley Cisco, [facility] 08/18/2017,11:26 PM

## 2017-08-18 NOTE — ED Provider Notes (Signed)
[facility] EMERGENCY DEPARTMENT Provider Note   CSN: [id number] Arrival date & time: 08/18/17  2134     History   Chief Complaint Chief Complaint  Patient presents with  . Fever    HPI Richard Mcpherson is a 77 y.o. male. Level 5 caveat due to nonverbal status. HPI Patient sent from nursing home with fever of 101 axillary tachycardia tachypnea and oxygen sats in the 70s.  He is chronically trached and pegged.  Previous cerebral aneurysm rupture.  Oxygenation improved with oxygen here. Past Medical History:  Diagnosis Date  . Atrial premature beats   . Blood in stool   . Cerebral aneurysm   . Coronary atherosclerosis of native coronary artery   . Embolism (HCC)    History of right lower extremity distal embolism   . Gout   . Hyperglycemia   . Hyperlipidemia, mixed   . Osteoarthritis   . Plantar fasciitis   . Sinusitis   . Stroke (HCC)   . Subarachnoid hemorrhage (HCC)   . Unspecified essential hypertension     Patient Active Problem List   Diagnosis Date Noted  . 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 Stroke (cerebrum) (HCC) 04/18/2017  . SAH (subarachnoid hemorrhage) (HCC) 04/18/2017  . 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 Hyperlipidemia 03/25/2009  . HYPERTENSION, UNSPECIFIED 03/25/2009  . CAD, NATIVE VESSEL 03/25/2009  . PALPITATIONS 03/25/2009    Past Surgical History:  Procedure Laterality Date  . APPENDECTOMY    . cardiac catherization    . CATARACT EXTRACTION W/PHACO Right 02/19/2014   Procedure: CATARACT EXTRACTION PHACO AND INTRAOCULAR LENS PLACEMENT (IOC);  Surgeon: Gemma Payor, MD;  Location: [facility];  Service: Ophthalmology;  Laterality: Right;  CDE 12.71  . CATARACT EXTRACTION W/PHACO Left 12/28/2014   Procedure: CATARACT EXTRACTION PHACO AND INTRAOCULAR LENS PLACEMENT (IOC);  Surgeon: Gemma Payor, MD;  Location: [facility];  Service: Ophthalmology;  Laterality: Left;  CDE:8.05  .  embolectomy 2000    . IR GASTROSTOMY TUBE MOD SED  08/06/2017       Home Medications    Prior to Admission medications   Medication Sig Start Date End Date Taking? Authorizing Provider  aspirin EC 81 MG tablet Take 81 mg by mouth daily.    [provider]  atenolol-chlorthalidone (TENORETIC) 50-25 MG per tablet Take 1 tablet by mouth daily.    [provider]  chlorthalidone (HYGROTON) 50 MG tablet Take by mouth.    [provider]  diclofenac (VOLTAREN) 75 MG EC tablet Take 75 mg by mouth 2 (two) times daily as needed. 04/16/17   [provider]  Misc Natural Products (TART CHERRY ADVANCED) CAPS Take by mouth.    [provider]  Naproxen Sodium (ALEVE PO) Take by mouth.    [provider]  OVER THE COUNTER MEDICATION     [provider]  psyllium (METAMUCIL) 58.6 % powder Take 1 packet by mouth 3 (three) times daily.    [provider]    Family History Family History  Problem Relation Age of Onset  . Cancer Mother     Social History Social History   Tobacco Use  . Smoking status: Former Smoker    Packs/day: 1.00    Years: 40.00    Pack years: 40.00  . Smokeless tobacco: Current User    Types: Snuff  Substance Use Topics  . Alcohol use: Yes    Comment: 1-2 beers every other day   .  Drug use: No     Allergies   Codeine; Allopurinol; Crestor [rosuvastatin calcium]; and Lipitor [atorvastatin]   Review of Systems Review of Systems  Unable to perform ROS: Patient nonverbal     Physical Exam Updated Vital Signs BP 121/62   Pulse (!) 126   Temp (!) 102.5 F (39.2 C) (Rectal)   Resp (!) 43   Ht 6' (1.829 m)   SpO2 90%   BMI 25.28 kg/m   Physical Exam  HENT:  Head: Atraumatic.  Eyes: Pupils are equal, round, and reactive to light.  Neck:  Patient has cervical collar in place.  Also trached.  Cardiovascular:  Tachycardia  Pulmonary/Chest:  Tachypnea.  Harsh breath sounds at the  bases.  Abdominal: There is no tenderness.  Musculoskeletal: He exhibits no edema.  Neurological:  Patient is nonverbal.  Minimal response to pain.  Skin: Capillary refill takes less than 2 seconds. He is not diaphoretic. No erythema.  Patient has sacral decubitus ulcers.  Some muscles visible     ED Treatments / Results  Labs (all labs ordered are listed, but only abnormal results are displayed) Labs Reviewed  COMPREHENSIVE METABOLIC PANEL - Abnormal; Notable for the following components:      Result Value   Glucose, Bld 147 (*)    BUN 39 (*)    Creatinine, Ser 0.54 (*)    Albumin 2.3 (*)    AST 55 (*)    ALT 97 (*)    Alkaline Phosphatase 414 (*)    All other components within normal limits  CBC WITH DIFFERENTIAL/PLATELET - Abnormal; Notable for the following components:   WBC 19.4 (*)    Hemoglobin 11.8 (*)    MCHC 29.7 (*)    RDW 16.3 (*)    Monocytes Absolute 1.1 (*)    All other components within normal limits  PROTIME-INR - Abnormal; Notable for the following components:   Prothrombin Time 16.7 (*)    All other components within normal limits  URINALYSIS, ROUTINE W REFLEX MICROSCOPIC - Abnormal; Notable for the following components:   Color, Urine AMBER (*)    APPearance HAZY (*)    Hgb urine dipstick MODERATE (*)    Protein, ur 30 (*)    Squamous Epithelial / LPF 0-5 (*)    All other components within normal limits  CULTURE, BLOOD (ROUTINE X 2)  CULTURE, BLOOD (ROUTINE X 2)  URINE CULTURE  I-STAT CG4 LACTIC ACID, ED    EKG  EKG Interpretation None       Radiology Dg Chest Portable 1 View  Result Date: 08/18/2017 CLINICAL DATA:  Chronic ventilator dependent respiratory failure, presenting with fever, tachycardia, tachypnea and hypoxia. Personal history of stroke. EXAM: PORTABLE CHEST 1 VIEW COMPARISON:  07/22/2017, 11/23/2016 and earlier. FINDINGS: Tracheostomy tube tip in satisfactory position below the thoracic inlet. Cardiac silhouette upper normal in  size for [facility] portable technique, unchanged. Dense airspace consolidation involving the left lower lobe. Lungs otherwise clear. 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 IMPRESSION: 1. Dense left lower lobe pneumonia and/or atelectasis. 2. 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 Electronically Signed   By: Hulan Saas M.D.   On: 08/18/2017 22:15    Procedures Procedures (including critical care time)  Medications Ordered in ED Medications  sodium chloride 0.9 % bolus 1,000 mL (1,000 mLs Intravenous New Bag/Given 08/18/17 2207)  vancomycin (VANCOCIN) IVPB 1000 mg/200 mL premix (1,000 mg Intravenous New Bag/Given 08/18/17 2207)  piperacillin-tazobactam (ZOSYN) IVPB 3.375 g (0 g Intravenous  Stopped 08/18/17 2252)     Initial Impression / Assessment and Plan / ED Course  I have reviewed the triage vital signs and the nursing notes.  Pertinent labs & imaging results that were available during my care of the patient were reviewed by me and considered in my medical decision making (see chart for details).     Patient presents with fever and hypoxia.  Pneumonia on x-ray.  Normal lactic acid.  Not hypotensive.  Chronically trached.  Oxygenation improved with increased oxygen.  Antibiotics given.  Will admit to hospitalist.  CRITICAL CARE Performed by: Benjiman Core Total critical care time: 30 minutes Critical care time was exclusive of separately billable procedures and treating other patients. Critical care was necessary to treat or prevent imminent or life-threatening deterioration. Critical care was time spent personally by me on the following activities: development of treatment plan with patient and/or surrogate as well as nursing, discussions with consultants, evaluation of patient's response to treatment, examination of patient, obtaining history from patient or surrogate, ordering and performing treatments and interventions, ordering and review of laboratory  studies, ordering and review of radiographic studies, pulse oximetry and re-evaluation of patient's condition.   Final Clinical Impressions(s) / ED Diagnoses   Final diagnoses:  Healthcare-associated pneumonia  Sepsis, due to unspecified organism [facility])    ED Discharge Orders    None       Benjiman Core, MD 08/18/17 2255

## 2017-08-18 NOTE — ED Notes (Signed)
RT notified for blood gas. 

## 2017-08-18 NOTE — ED Triage Notes (Signed)
Pt from [facility] with fever (101 ax), tachycardia (126), tachypnea (RR 44), decreased O2 sats (76%) on room air.

## 2017-08-18 NOTE — ED Notes (Signed)
EDP at bedside  

## 2017-08-19 ENCOUNTER — Inpatient Hospital Stay (HOSPITAL_COMMUNITY): Payer: Medicare Other

## 2017-08-19 ENCOUNTER — Encounter (HOSPITAL_COMMUNITY): Payer: Self-pay | Admitting: Emergency Medicine

## 2017-08-19 DIAGNOSIS — J690 Pneumonitis due to inhalation of food and vomit: Secondary | ICD-10-CM

## 2017-08-19 DIAGNOSIS — A419 Sepsis, unspecified organism: Secondary | ICD-10-CM

## 2017-08-19 DIAGNOSIS — I2510 Atherosclerotic heart disease of native coronary artery without angina pectoris: Secondary | ICD-10-CM

## 2017-08-19 DIAGNOSIS — E440 Moderate protein-calorie malnutrition: Secondary | ICD-10-CM

## 2017-08-19 DIAGNOSIS — I82621 Acute embolism and thrombosis of deep veins of right upper extremity: Secondary | ICD-10-CM

## 2017-08-19 DIAGNOSIS — I82A11 Acute embolism and thrombosis of right axillary vein: Secondary | ICD-10-CM

## 2017-08-19 DIAGNOSIS — J189 Pneumonia, unspecified organism: Secondary | ICD-10-CM

## 2017-08-19 DIAGNOSIS — R740 Nonspecific elevation of levels of transaminase and lactic acid dehydrogenase [LDH]: Secondary | ICD-10-CM

## 2017-08-19 DIAGNOSIS — R7401 Elevation of levels of liver transaminase levels: Secondary | ICD-10-CM

## 2017-08-19 DIAGNOSIS — Z930 Tracheostomy status: Secondary | ICD-10-CM

## 2017-08-19 DIAGNOSIS — J181 Lobar pneumonia, unspecified organism: Secondary | ICD-10-CM

## 2017-08-19 DIAGNOSIS — R0902 Hypoxemia: Secondary | ICD-10-CM

## 2017-08-19 DIAGNOSIS — J9620 Acute and chronic respiratory failure, unspecified whether with hypoxia or hypercapnia: Secondary | ICD-10-CM

## 2017-08-19 DIAGNOSIS — L89153 Pressure ulcer of sacral region, stage 3: Secondary | ICD-10-CM

## 2017-08-19 DIAGNOSIS — I609 Nontraumatic subarachnoid hemorrhage, unspecified: Secondary | ICD-10-CM

## 2017-08-19 DIAGNOSIS — A412 Sepsis due to unspecified staphylococcus: Secondary | ICD-10-CM

## 2017-08-19 DIAGNOSIS — J9621 Acute and chronic respiratory failure with hypoxia: Secondary | ICD-10-CM

## 2017-08-19 DIAGNOSIS — I10 Essential (primary) hypertension: Secondary | ICD-10-CM

## 2017-08-19 LAB — I-STAT CG4 LACTIC ACID, ED: LACTIC ACID, VENOUS: 1.4 mmol/L (ref 0.5–1.9)

## 2017-08-19 LAB — INFLUENZA PANEL BY PCR (TYPE A & B)
Influenza A By PCR: NEGATIVE
Influenza B By PCR: NEGATIVE

## 2017-08-19 LAB — RESPIRATORY PANEL BY PCR
ADENOVIRUS-RVPPCR: NOT DETECTED
BORDETELLA PERTUSSIS-RVPCR: NOT DETECTED
CORONAVIRUS 229E-RVPPCR: NOT DETECTED
CORONAVIRUS HKU1-RVPPCR: NOT DETECTED
CORONAVIRUS NL63-RVPPCR: NOT DETECTED
Chlamydophila pneumoniae: NOT DETECTED
Coronavirus OC43: NOT DETECTED
Influenza A: NOT DETECTED
Influenza B: NOT DETECTED
METAPNEUMOVIRUS-RVPPCR: NOT DETECTED
Mycoplasma pneumoniae: NOT DETECTED
PARAINFLUENZA VIRUS 2-RVPPCR: NOT DETECTED
PARAINFLUENZA VIRUS 3-RVPPCR: NOT DETECTED
Parainfluenza Virus 1: NOT DETECTED
Parainfluenza Virus 4: NOT DETECTED
RHINOVIRUS / ENTEROVIRUS - RVPPCR: NOT DETECTED
Respiratory Syncytial Virus: NOT DETECTED

## 2017-08-19 LAB — BLOOD GAS, ARTERIAL
Acid-Base Excess: 1.7 mmol/L (ref 0.0–2.0)
Acid-Base Excess: 1.8 mmol/L (ref 0.0–2.0)
Acid-Base Excess: 2.6 mmol/L — ABNORMAL HIGH (ref 0.0–2.0)
BICARBONATE: 26.1 mmol/L (ref 20.0–28.0)
Bicarbonate: 26.3 mmol/L (ref 20.0–28.0)
Bicarbonate: 26.9 mmol/L (ref 20.0–28.0)
DRAWN BY: 105551
DRAWN BY: 22223
Drawn by: 23534
FIO2: 1
FIO2: 100
FIO2: 98
LHR: 20 {breaths}/min
O2 SAT: 96.3 %
O2 Saturation: 94.8 %
O2 Saturation: 98.9 %
PEEP: 5 cmH2O
PH ART: 7.512 — AB (ref 7.350–7.450)
PO2 ART: 86.4 mmHg (ref 83.0–108.0)
PO2 ART: 152 mmHg — AB (ref 83.0–108.0)
Patient temperature: 37
VT: 620 mL
pCO2 arterial: 30.9 mmHg — ABNORMAL LOW (ref 32.0–48.0)
pCO2 arterial: 36.5 mmHg (ref 32.0–48.0)
pCO2 arterial: 38.4 mmHg (ref 32.0–48.0)
pH, Arterial: 7.476 — ABNORMAL HIGH (ref 7.350–7.450)
pH, Arterial: 7.439 (ref 7.350–7.450)
pO2, Arterial: 71.7 mmHg — ABNORMAL LOW (ref 83.0–108.0)

## 2017-08-19 LAB — GLUCOSE, CAPILLARY: Glucose-Capillary: 135 mg/dL — ABNORMAL HIGH (ref 65–99)

## 2017-08-19 LAB — BASIC METABOLIC PANEL
ANION GAP: 10 (ref 5–15)
BUN: 33 mg/dL — ABNORMAL HIGH (ref 6–20)
CALCIUM: 8.9 mg/dL (ref 8.9–10.3)
CO2: 24 mmol/L (ref 22–32)
Chloride: 112 mmol/L — ABNORMAL HIGH (ref 101–111)
Creatinine, Ser: 0.39 mg/dL — ABNORMAL LOW (ref 0.61–1.24)
GFR calc Af Amer: >60 mL/min (ref >60)
GLUCOSE: 129 mg/dL — AB (ref 65–99)
POTASSIUM: 3.6 mmol/L (ref 3.5–5.1)
Sodium: 146 mmol/L — ABNORMAL HIGH (ref 135–145)

## 2017-08-19 LAB — PROCALCITONIN: PROCALCITONIN: 0.22 ng/mL

## 2017-08-19 LAB — MRSA PCR SCREENING: MRSA BY PCR: NEGATIVE

## 2017-08-19 LAB — CBC
HEMATOCRIT: 36.7 % — AB (ref 39.0–52.0)
Hemoglobin: 10.7 g/dL — ABNORMAL LOW (ref 13.0–17.0)
MCH: 25.7 pg — ABNORMAL LOW (ref 26.0–34.0)
MCHC: 29.2 g/dL — AB (ref 30.0–36.0)
MCV: 88.2 fL (ref 78.0–100.0)
Platelets: 298 10*3/uL (ref 150–400)
RBC: 4.16 MIL/uL — ABNORMAL LOW (ref 4.22–5.81)
RDW: 16.3 % — ABNORMAL HIGH (ref 11.5–15.5)
WBC: 21.9 10*3/uL — AB (ref 4.0–10.5)

## 2017-08-19 LAB — LACTIC ACID, PLASMA: LACTIC ACID, VENOUS: 1 mmol/L (ref 0.5–1.9)

## 2017-08-19 LAB — TRIGLYCERIDES: TRIGLYCERIDES: 115 mg/dL (ref <150)

## 2017-08-19 MED ORDER — FENTANYL CITRATE (PF) 100 MCG/2ML IJ SOLN
50.0000 ug | INTRAMUSCULAR | Status: DC | PRN
  Administered 2017-08-19: 50 ug via INTRAVENOUS
  Filled 2017-08-19: qty 2

## 2017-08-19 MED ORDER — MIDAZOLAM HCL 2 MG/2ML IJ SOLN
1.0000 mg | INTRAMUSCULAR | Status: DC | PRN
  Administered 2017-08-19: 1 mg via INTRAVENOUS
  Filled 2017-08-19: qty 2

## 2017-08-19 MED ORDER — VANCOMYCIN HCL IN DEXTROSE 750-5 MG/150ML-% IV SOLN
750.0000 mg | Freq: Two times a day (BID) | INTRAVENOUS | Status: DC
  Filled 2017-08-19 (×2): qty 150

## 2017-08-19 MED ORDER — SODIUM CHLORIDE 0.9 % IV SOLN
25.0000 ug/h | INTRAVENOUS | Status: DC
  Administered 2017-08-19: 50 ug/h via INTRAVENOUS
  Filled 2017-08-19: qty 50

## 2017-08-19 MED ORDER — IPRATROPIUM-ALBUTEROL 0.5-2.5 (3) MG/3ML IN SOLN
3.0000 mL | Freq: Four times a day (QID) | RESPIRATORY_TRACT | Status: DC
  Administered 2017-08-19 – 2017-08-27 (×30): 3 mL via RESPIRATORY_TRACT
  Filled 2017-08-19 (×31): qty 3

## 2017-08-19 MED ORDER — SODIUM CHLORIDE 0.9 % IV SOLN
2.0000 g | Freq: Three times a day (TID) | INTRAVENOUS | Status: DC
  Filled 2017-08-19 (×5): qty 2

## 2017-08-19 MED ORDER — ORAL CARE MOUTH RINSE
15.0000 mL | Freq: Two times a day (BID) | OROMUCOSAL | Status: DC
  Administered 2017-08-19 (×2): 15 mL via OROMUCOSAL

## 2017-08-19 MED ORDER — FENTANYL BOLUS VIA INFUSION
25.0000 ug | INTRAVENOUS | Status: DC | PRN
  Filled 2017-08-19: qty 25

## 2017-08-19 MED ORDER — CHLORHEXIDINE GLUCONATE 0.12 % MT SOLN
15.0000 mL | Freq: Two times a day (BID) | OROMUCOSAL | Status: DC
  Administered 2017-08-19 (×2): 15 mL via OROMUCOSAL
  Filled 2017-08-19: qty 15

## 2017-08-19 MED ORDER — METOPROLOL TARTRATE 5 MG/5ML IV SOLN
2.5000 mg | Freq: Four times a day (QID) | INTRAVENOUS | Status: DC
  Administered 2017-08-19 – 2017-08-27 (×33): 2.5 mg via INTRAVENOUS
  Filled 2017-08-19 (×33): qty 5

## 2017-08-19 MED ORDER — POTASSIUM CHLORIDE IN NACL 20-0.45 MEQ/L-% IV SOLN
INTRAVENOUS | Status: DC
  Administered 2017-08-19 – 2017-08-27 (×11): via INTRAVENOUS
  Filled 2017-08-19 (×17): qty 1000

## 2017-08-19 MED ORDER — SODIUM CHLORIDE 0.9 % IV SOLN
1.0000 g | Freq: Three times a day (TID) | INTRAVENOUS | Status: AC
  Administered 2017-08-19 – 2017-08-26 (×24): 1 g via INTRAVENOUS
  Filled 2017-08-19 (×26): qty 1

## 2017-08-19 MED ORDER — PROPOFOL 1000 MG/100ML IV EMUL
0.0000 ug/kg/min | INTRAVENOUS | Status: DC
  Administered 2017-08-19: 30 ug/kg/min via INTRAVENOUS
  Administered 2017-08-19: 5 ug/kg/min via INTRAVENOUS
  Administered 2017-08-20 – 2017-08-21 (×4): 30 ug/kg/min via INTRAVENOUS
  Administered 2017-08-21 – 2017-08-22 (×3): 15 ug/kg/min via INTRAVENOUS
  Filled 2017-08-19 (×9): qty 100

## 2017-08-19 MED ORDER — FENTANYL CITRATE (PF) 100 MCG/2ML IJ SOLN
50.0000 ug | Freq: Once | INTRAMUSCULAR | Status: AC
  Administered 2017-08-19: 50 ug via INTRAVENOUS
  Filled 2017-08-19: qty 2

## 2017-08-19 MED ORDER — POLYETHYLENE GLYCOL 3350 17 G PO PACK
17.0000 g | PACK | Freq: Every day | ORAL | Status: DC
  Administered 2017-08-19 – 2017-08-27 (×8): 17 g via ORAL
  Filled 2017-08-19 (×8): qty 1

## 2017-08-19 MED ORDER — MIDAZOLAM HCL 2 MG/2ML IJ SOLN
1.0000 mg | INTRAMUSCULAR | Status: AC | PRN
  Administered 2017-08-19 (×3): 1 mg via INTRAVENOUS
  Filled 2017-08-19 (×3): qty 2

## 2017-08-19 NOTE — Progress Notes (Signed)
ANTIBIOTIC CONSULT NOTE-Preliminary  Pharmacy Consult for Vanc/meropenem Indication: HCAP/Sepsis  Allergies  Allergen Reactions  . Codeine Nausea Only and Other (See Comments)  . Crestor [Rosuvastatin Calcium] Other (See Comments)    bodyache  . Lipitor [Atorvastatin] Other (See Comments)    Body aches  . Statins   . Allopurinol Rash    Patient Measurements: Height: 6' (182.9 cm) Weight: 165 lb 2 oz (74.9 kg) IBW/kg (Calculated) : 77.6   Vital Signs: Temp: 98.4 F (36.9 C) (02/03 0737) Temp Source: Axillary (02/03 0737) BP: 115/65 (02/03 0700) Pulse Rate: 125 (02/03 0930)  Labs: Recent Labs    08/18/17 2145 08/19/17 0450  WBC 19.4* 21.9*  HGB 11.8* 10.7*  PLT 348 298  CREATININE 0.54* 0.39*    Estimated Creatinine Clearance: 81.9 mL/min (A) (by C-G formula based on SCr of 0.39 mg/dL (L)).  No results for input(s): VANCOTROUGH, VANCOPEAK, VANCORANDOM, GENTTROUGH, GENTPEAK, GENTRANDOM, TOBRATROUGH, TOBRAPEAK, TOBRARND, AMIKACINPEAK, AMIKACINTROU, AMIKACIN in the last 72 hours.   Microbiology: Recent Results (from the past 720 hour(s))  Culture, Urine     Status: None   Collection Time: 07/27/17  2:30 PM  Result Value Ref Range Status   Specimen Description URINE, RANDOM  Final   Special Requests NONE  Final   Culture NO GROWTH  Final   Report Status 07/28/2017 FINAL  Final  Culture, respiratory (NON-Expectorated)     Status: None   Collection Time: 07/28/17  9:25 AM  Result Value Ref Range Status   Specimen Description TRACHEAL ASPIRATE  Final   Special Requests NONE  Final   Gram Stain   Final    RARE WBC PRESENT, PREDOMINANTLY MONONUCLEAR NO SQUAMOUS EPITHELIAL CELLS SEEN RARE GRAM POSITIVE COCCI IN PAIRS    Culture FEW PSEUDOMONAS FLUORESCENS  Final   Report Status 08/01/2017 FINAL  Final   Organism ID, Bacteria PSEUDOMONAS FLUORESCENS  Final      Susceptibility   Pseudomonas fluorescens - MIC*    CEFTAZIDIME 4 SENSITIVE Sensitive    CIPROFLOXACIN <=0.25 SENSITIVE Sensitive     GENTAMICIN <=1 SENSITIVE Sensitive     IMIPENEM <=0.25 SENSITIVE Sensitive     PIP/TAZO <=4 SENSITIVE Sensitive     CEFEPIME 16 INTERMEDIATE Intermediate     * FEW PSEUDOMONAS FLUORESCENS  Culture, blood (routine x 2)     Status: None   Collection Time: 07/31/17  2:19 PM  Result Value Ref Range Status   Specimen Description BLOOD LEFT HAND  Final   Special Requests   Final    BOTTLES DRAWN AEROBIC AND ANAEROBIC Blood Culture adequate volume   Culture NO GROWTH 5 DAYS  Final   Report Status 08/05/2017 FINAL  Final  Culture, blood (routine x 2)     Status: None   Collection Time: 07/31/17  2:19 PM  Result Value Ref Range Status   Specimen Description BLOOD WRIST LEFT  Final   Special Requests   Final    BOTTLES DRAWN AEROBIC AND ANAEROBIC Blood Culture adequate volume   Culture NO GROWTH 5 DAYS  Final   Report Status 08/05/2017 FINAL  Final  Culture, Urine     Status: None   Collection Time: 08/09/17  8:24 AM  Result Value Ref Range Status   Specimen Description URINE, CLEAN CATCH  Final   Special Requests Normal  Final   Culture NO GROWTH  Final   Report Status 08/10/2017 FINAL  Final  Culture, respiratory (NON-Expectorated)     Status: None   Collection Time: 08/09/17  8:24 AM  Result Value Ref Range Status   Specimen Description TRACHEAL ASPIRATE  Final   Special Requests Normal  Final   Gram Stain   Final    RARE WBC PRESENT, PREDOMINANTLY PMN MODERATE GRAM NEGATIVE RODS RARE GRAM POSITIVE COCCI IN PAIRS IN CLUSTERS RARE GRAM POSITIVE RODS    Culture MODERATE ENTEROBACTER AEROGENES  Final   Report Status 08/11/2017 FINAL  Final   Organism ID, Bacteria ENTEROBACTER AEROGENES  Final      Susceptibility   Enterobacter aerogenes - MIC*    CEFAZOLIN >=64 RESISTANT Resistant     CEFEPIME <=1 SENSITIVE Sensitive     CEFTAZIDIME <=1 SENSITIVE Sensitive     CEFTRIAXONE <=1 SENSITIVE Sensitive     CIPROFLOXACIN <=0.25 SENSITIVE  Sensitive     GENTAMICIN <=1 SENSITIVE Sensitive     IMIPENEM 0.5 SENSITIVE Sensitive     TRIMETH/SULFA <=20 SENSITIVE Sensitive     PIP/TAZO <=4 SENSITIVE Sensitive     * MODERATE ENTEROBACTER AEROGENES  Culture, blood (routine x 2)     Status: None   Collection Time: 08/09/17 10:05 AM  Result Value Ref Range Status   Specimen Description BLOOD LEFT ARM  Final   Special Requests   Final    BOTTLES DRAWN AEROBIC AND ANAEROBIC Blood Culture adequate volume   Culture NO GROWTH 5 DAYS  Final   Report Status 08/14/2017 FINAL  Final  Culture, blood (routine x 2)     Status: None   Collection Time: 08/09/17 10:21 AM  Result Value Ref Range Status   Specimen Description BLOOD LEFT ARM  Final   Special Requests   Final    BOTTLES DRAWN AEROBIC AND ANAEROBIC Blood Culture adequate volume   Culture NO GROWTH 5 DAYS  Final   Report Status 08/14/2017 FINAL  Final  Culture, blood (Routine x 2)     Status: None (Preliminary result)   Collection Time: 08/18/17  9:42 PM  Result Value Ref Range Status   Specimen Description LEFT ANTECUBITAL  Final   Special Requests   Final    BOTTLES DRAWN AEROBIC AND ANAEROBIC Blood Culture adequate volume   Culture   Final    NO GROWTH < 12 HOURS Performed at [facility], [address] [place], [place] [address]    Report Status PENDING  Incomplete  Culture, blood (Routine x 2)     Status: None (Preliminary result)   Collection Time: 08/18/17  9:51 PM  Result Value Ref Range Status   Specimen Description BLOOD LEFT FOREARM  Final   Special Requests   Final    BOTTLES DRAWN AEROBIC ONLY Blood Culture adequate volume   Culture   Final    NO GROWTH < 12 HOURS Performed at [facility], [address] [place], [place] [address]    Report Status PENDING  Incomplete  MRSA PCR Screening     Status: None   Collection Time: 08/19/17  1:09 AM  Result Value Ref Range Status   MRSA by PCR NEGATIVE NEGATIVE Final    Comment:        The GeneXpert MRSA Assay  (FDA approved for NASAL specimens only), is one component of a comprehensive MRSA colonization surveillance program. It is not intended to diagnose MRSA infection nor to guide or monitor treatment for MRSA infections. Performed at [facility], [address], [place], [place] [address]     Medical History: Past Medical History:  Diagnosis Date  . Atrial premature beats   .  Blood in stool   . Cerebral aneurysm   . Coronary atherosclerosis of native coronary artery   . Embolism (HCC)    History of right lower extremity distal embolism   . Gout   . Hyperglycemia   . Hyperlipidemia, mixed   . Osteoarthritis   . Plantar fasciitis   . Sinusitis   . Stroke (HCC)   . Subarachnoid hemorrhage (HCC)   . Unspecified essential hypertension     Medications:  See medication history Assessment: Patient sent from nursing home with fever of 101 axillary tachycardia tachypnea and oxygen sats in the 70s.  He is chronically trached and pegged. Pneumonia on x-ray.  Plan:  Continue vancomycin 750 mg IV q12 hours Meropenem 1gm IV q8 hours F/u renal function, cultures and clinical course  Fahima Cifelli Poteet, RPH 08/19/2017,10:26 AM

## 2017-08-19 NOTE — Consult Note (Signed)
Consult requested by: [facility] hospitalist, Dr. Arbutus Leas Consult requested for: Respiratory failure  HPI: This is a 77 year old with a very complicated medical history including previous stroke hypertension hyperlipidemia coronary disease who came from a skilled care facility with fever.  He had a stroke ended up having IV TPA had multiple complications including a subdural hematoma.  Had cardiac arrest had C6 fusion C1-C2-C3 LAD lactamase eventually extubated later reintubated had an axillary DVT from a PICC line developed ventilator associated pneumonia and had difficulty weaning and eventually had tracheostomy and gastrostomy tubes placed.  [facility] and eventually was weaned off the ventilator discharged to a skilled care facility on 08/18/2027 18 then developed fever to 101.0 on 08/18/2017 brought to the emergency department had higher fever tachycardia hypoxia felt to be septic started on IV fluids and vancomycin and Zosyn.  History is from the medical record and from his wife at bedside. He does not communicate verbally.  He does require total care. Past Medical History:  Diagnosis Date  . Atrial premature beats   . Blood in stool   . Cerebral aneurysm   . Coronary atherosclerosis of native coronary artery   . Embolism (HCC)    History of right lower extremity distal embolism   . Gout   . Hyperglycemia   . Hyperlipidemia, mixed   . Osteoarthritis   . Plantar fasciitis   . Sinusitis   . Stroke (HCC)   . Subarachnoid hemorrhage (HCC)   . Unspecified essential hypertension      Family History  Problem Relation Age of Onset  . Cancer Mother      Social History   Socioeconomic History  . Marital status: Married    Spouse name: None  . Number of children: None  . Years of education: None  . Highest education level: None  Social Needs  . Financial resource strain: None  . Food insecurity - worry: None  . Food insecurity - inability: None  .  Transportation needs - medical: None  . Transportation needs - non-medical: None  Occupational History  . Occupation: retired  Tobacco Use  . Smoking status: Former Smoker    Packs/day: 1.00    Years: 40.00    Pack years: 40.00  . Smokeless tobacco: Current User    Types: Snuff  Substance and Sexual Activity  . Alcohol use: Yes    Comment: 1-2 beers every other day   . Drug use: No  . Sexual activity: None  Other Topics Concern  . None  Social History Narrative  . None     ROS: Unobtainable    Objective: Vital signs in last 24 hours: Temp:  [98.4 F (36.9 C)-102.5 F (39.2 C)] 98.4 F (36.9 C) (02/03 0737) Pulse Rate:  [100-128] 119 (02/03 0737) Resp:  [27-45] 37 (02/03 0737) BP: (110-128)/(62-81) 115/65 (02/03 0700) SpO2:  [88 %-96 %] 89 % (02/03 0737) Weight:  [74.9 kg (165 lb 2 oz)-83.4 kg (183 lb 14.4 oz)] 74.9 kg (165 lb 2 oz) (02/03 0125) Weight change:     Intake/Output from previous day: 02/02 0701 - 02/03 0700 In: 2000 [IV Piggyback:2000] Out: 450 [Urine:450]  PHYSICAL EXAM Constitutional: He is awake and looks at me but does not attempt to speak.  Eyes: His pupils react.  EOMI.  Ears nose mouth and throat: Mucous membranes are slightly dry.  He has a tracheostomy in place.  Cardiovascular: He has tachycardia at about 120 but no gallop.  Respiratory: He  has substantial rales and rhonchi on the right side his left lung is pretty clear.  Gastrointestinal: He has a feeding tube in place.  Bowel sounds are present.  Musculoskeletal: He moves all 4 extremities.  Neurological: Not really able to assess.  Psychiatric: Not really able to assess.  Lab Results: Basic Metabolic Panel: Recent Labs    08/18/17 2145 08/19/17 0450  NA 145 146*  K 4.1 3.6  CL 109 112*  CO2 26 24  GLUCOSE 147* 129*  BUN 39* 33*  CREATININE 0.54* 0.39*  CALCIUM 9.0 8.9   Liver Function Tests: Recent Labs    08/18/17 2145  AST 55*  ALT 97*  ALKPHOS 414*  BILITOT 0.6  PROT  7.3  ALBUMIN 2.3*   No results for input(s): LIPASE, AMYLASE in the last 72 hours. No results for input(s): AMMONIA in the last 72 hours. CBC: Recent Labs    08/18/17 2145 08/19/17 0450  WBC 19.4* 21.9*  NEUTROABS 16.9  --   HGB 11.8* 10.7*  HCT 39.7 36.7*  MCV 87.6 88.2  PLT 348 298   Cardiac Enzymes: No results for input(s): CKTOTAL, CKMB, CKMBINDEX, TROPONINI in the last 72 hours. BNP: No results for input(s): PROBNP in the last 72 hours. D-Dimer: No results for input(s): DDIMER in the last 72 hours. CBG: Recent Labs    08/19/17 0736  GLUCAP 135*   Hemoglobin A1C: No results for input(s): HGBA1C in the last 72 hours. Fasting Lipid Panel: No results for input(s): CHOL, HDL, LDLCALC, TRIG, CHOLHDL, LDLDIRECT in the last 72 hours. Thyroid Function Tests: No results for input(s): TSH, T4TOTAL, FREET4, T3FREE, THYROIDAB in the last 72 hours. Anemia Panel: No results for input(s): VITAMINB12, FOLATE, FERRITIN, TIBC, IRON, RETICCTPCT in the last 72 hours. Coagulation: Recent Labs    08/17/17 0548 08/18/17 2145  LABPROT 15.1 16.7*  INR 1.20 1.37   Urine Drug Screen: Drugs of Abuse  No results found for: LABOPIA, COCAINSCRNUR, LABBENZ, AMPHETMU, THCU, LABBARB  Alcohol Level: No results for input(s): ETH in the last 72 hours. Urinalysis: Recent Labs    08/18/17 2146  COLORURINE AMBER*  LABSPEC 1.025  PHURINE 6.0  GLUCOSEU NEGATIVE  HGBUR MODERATE*  BILIRUBINUR NEGATIVE  KETONESUR NEGATIVE  PROTEINUR 30*  NITRITE NEGATIVE  LEUKOCYTESUR NEGATIVE   Misc. Labs:   ABGS: Recent Labs    08/19/17 0352  PHART 7.476*  PO2ART 86.4  HCO3 26.9     MICROBIOLOGY: Recent Results (from the past 240 hour(s))  Culture, blood (routine x 2)     Status: None   Collection Time: 08/09/17 10:05 AM  Result Value Ref Range Status   Specimen Description BLOOD LEFT ARM  Final   Special Requests   Final    BOTTLES DRAWN AEROBIC AND ANAEROBIC Blood Culture adequate  volume   Culture NO GROWTH 5 DAYS  Final   Report Status 08/14/2017 FINAL  Final  Culture, blood (routine x 2)     Status: None   Collection Time: 08/09/17 10:21 AM  Result Value Ref Range Status   Specimen Description BLOOD LEFT ARM  Final   Special Requests   Final    BOTTLES DRAWN AEROBIC AND ANAEROBIC Blood Culture adequate volume   Culture NO GROWTH 5 DAYS  Final   Report Status 08/14/2017 FINAL  Final  Culture, blood (Routine x 2)     Status: None (Preliminary result)   Collection Time: 08/18/17  9:42 PM  Result Value Ref Range Status   Specimen Description LEFT  ANTECUBITAL  Final   Special Requests   Final    BOTTLES DRAWN AEROBIC AND ANAEROBIC Blood Culture adequate volume   Culture   Final    NO GROWTH < 12 HOURS Performed at [facility], [address], [place], [place] [address]    Report Status PENDING  Incomplete  Culture, blood (Routine x 2)     Status: None (Preliminary result)   Collection Time: 08/18/17  9:51 PM  Result Value Ref Range Status   Specimen Description BLOOD LEFT FOREARM  Final   Special Requests   Final    BOTTLES DRAWN AEROBIC ONLY Blood Culture adequate volume   Culture   Final    NO GROWTH < 12 HOURS Performed at [facility], [address] [place], [place] [address]    Report Status PENDING  Incomplete  MRSA PCR Screening     Status: None   Collection Time: 08/19/17  1:09 AM  Result Value Ref Range Status   MRSA by PCR NEGATIVE NEGATIVE Final    Comment:        The GeneXpert MRSA Assay (FDA approved for NASAL specimens only), is one component of a comprehensive MRSA colonization surveillance program. It is not intended to diagnose MRSA infection nor to guide or monitor treatment for MRSA infections. Performed at [facility], [address] [place], [place] [address]     Studies/Results: Dg Chest Port 1 View  Result Date: 08/19/2017 CLINICAL DATA:  Follow-up healthcare associated pneumonia, sepsis EXAM: PORTABLE CHEST 1 VIEW  COMPARISON:  08/18/2017 FINDINGS: Moderate left pleural effusion, mildly increased. Associated left lower lobe opacity, favoring compressive atelectasis, pneumonia not excluded. Mild left suprahilar opacity, new, favoring asymmetric interstitial edema given the rapid change. Right lung is essentially clear. No pneumothorax. Tracheostomy in satisfactory position. The heart is top-normal in size. IMPRESSION: Mild left suprahilar opacity, new, favoring asymmetric interstitial edema given the rapid change. Moderate left pleural effusion, increased. Associated left lower lobe opacity, favoring compressive atelectasis, pneumonia not excluded. Electronically Signed   By: Charline Bills M.D.   On: 08/19/2017 07:14   Dg Chest Portable 1 View  Result Date: 08/18/2017 CLINICAL DATA:  Chronic ventilator dependent respiratory failure, presenting with fever, tachycardia, tachypnea and hypoxia. Personal history of stroke. EXAM: PORTABLE CHEST 1 VIEW COMPARISON:  07/22/2017, 11/23/2016 and earlier. FINDINGS: Tracheostomy tube tip in satisfactory position below the thoracic inlet. Cardiac silhouette upper normal in size for AP portable technique, unchanged. Dense airspace consolidation involving the left lower lobe. Lungs otherwise clear. 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 IMPRESSION: 1. Dense left lower lobe pneumonia and/or atelectasis. 2. 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 Electronically Signed   By: Hulan Saas M.D.   On: 08/18/2017 22:15    Medications:  Prior to Admission:  Medications Prior to Admission  Medication Sig Dispense Refill Last Dose  . allopurinol (ZYLOPRIM) 100 MG tablet Place 100 mg into feeding tube 3 (three) times a week. Every Tuesday, Thursday, Sunday   08/16/2017 at Unknown time  . amantadine (SYMMETREL) 100 MG capsule Place 100 mg into feeding tube 2 (two) times daily.   08/18/2017 at Unknown time  . apixaban (ELIQUIS) 2.5 MG TABS tablet Place 2.5  mg into feeding tube 2 (two) times daily.   08/18/2017 at 1700  . collagenase (SANTYL) ointment Apply 1 application topically daily. Right buttocks area   08/18/2017 at Unknown time  . famotidine (PEPCID) 20 MG tablet Place 20 mg into feeding tube 2 (two) times daily.  08/18/2017 at Unknown time  . HYDROcodone-acetaminophen (NORCO/VICODIN) 5-325 MG tablet Place 1 tablet into feeding tube every 6 (six) hours as needed for moderate pain.   unknown  . Melatonin 3 MG TABS Place 3 mg into feeding tube at bedtime.   08/18/2017 at Unknown time  . Metoprolol Tartrate 37.5 MG TABS Place 1 tablet into feeding tube daily.   08/18/2017 at 800a  . modafinil (PROVIGIL) 100 MG tablet Place 100 mg into feeding tube daily.   08/18/2017 at Unknown time  . Omega-3 Fatty Acids (FISH OIL) 1000 MG CAPS Place 1 capsule into feeding tube daily.   08/18/2017 at Unknown time  . polyethylene glycol powder (GLYCOLAX/MIRALAX) powder Take 17 g by mouth daily.   08/18/2017 at Unknown time  . sertraline (ZOLOFT) 50 MG tablet Place 50 mg into feeding tube daily.   08/18/2017 at Unknown time  . tamsulosin (FLOMAX) 0.4 MG CAPS capsule 0.4 mg daily. Per G-Tube   08/18/2017 at Unknown time   Scheduled: . allopurinol  100 mg Per Tube Daily  . amantadine  100 mg Per Tube BID  . apixaban  2.5 mg Per Tube BID  . chlorhexidine  15 mL Mouth Rinse BID  . collagenase  1 application Topical Daily  . famotidine  20 mg Per Tube BID  . ipratropium-albuterol  3 mL Nebulization Q6H  . mouth rinse  15 mL Mouth Rinse q12n4p  . metoprolol tartrate  2.5 mg Intravenous Q6H  . modafinil  100 mg Per Tube Daily  . omega-3 acid ethyl esters  1 g Per Tube Daily  . polyethylene glycol  17 g Oral Daily  . sertraline  50 mg Per Tube Daily  . tamsulosin  0.4 mg Oral Daily   Continuous: . 0.45 % NaCl with KCl 20 mEq / L    . meropenem (MERREM) IV     [id number] **OR** acetaminophen, ondansetron **OR** ondansetron (ZOFRAN) IV  Assesment: He was admitted with  sepsis likely from aspiration pneumonia.  He is on appropriate treatment for that.  He has acute on chronic hypoxic respiratory failure he is breathing 30 times a minute and has poor oxygenation on 100% oxygen so I think he is going to require mechanical ventilation.  His tracheostomy tube is uncuffed so that will need to be changed  He has elevated liver function testing.  He has had a stroke with multiple complications Principal Problem:   Sepsis (HCC) Active Problems:   Hyperlipidemia   Essential hypertension   CAD, NATIVE VESSEL   Stroke (cerebrum) (HCC)   Pneumonia   Acute hypoxemic respiratory failure (HCC)   Acute on chronic respiratory failure with hypoxia (HCC)   Sepsis due to undetermined organism (HCC)   Aspiration pneumonia of both lower lobes due to gastric secretions (HCC)   Tracheostomy status (HCC)   Deep vein thrombosis (DVT) of right upper extremity (HCC)   Transaminasemia   Lobar pneumonia (HCC)    Plan: Change out his trach tube.  Put him on the ventilator at least overnight.    LOS: 1 day   Shean Gerding L 08/19/2017, 8:37 AM

## 2017-08-19 NOTE — Progress Notes (Signed)
PROGRESS NOTE  Richard Mcpherson ZOX:[id number] DOB: September 17, 1940 DOA: 08/18/2017 PCP: Kirstie Peri, MD  Brief History:  77 year old male with a history of stroke, hypertension, hyperlipidemia, coronary artery disease presented from skilled nursing facility with fevers times 1 day.  The patient has a complicated history which started after he was admitted to [facility] on 06/19/2017 when the patient had an unwitnessed fall and was noted to have mental status change with right hemiparesis.  The patient was transferred to [facility] where he was found to have an acute infarct in the right cerebral peduncle.  The patient received IV TPA.  The patient had a prolonged hospitalization with numerous complications.  The patient was subsequently noted to have a subdural hematoma on MRI.  During his hospitalization, the patient had an asystole cardiac arrest which was felt to be a result of his cervical spine contusion.  He was taken to surgery on 07/04/2017 where an occipital to C6 fusion and C1, C2, C3 laminectomies were performed.  The patient was initially extubated, but reintubated on the same day on 07/04/2017.  His Hospitalization was further complicated by a right axillary vein DVT resulting from a right upper extremity PICC line.  He was started on a heparin drip.  Repeat MRI of the brain on 07/14/2017 showed resolving SDH.  The patient subsequently developed ventilator associated pneumonia with Enterobacter.  He was treated with Zosyn.  Unfortunately, the patient continued to have fevers.  Infectious disease was consulted and followed the patient throughout the hospitalization.  Unfortunately, the patient had difficulty weaning from the ventilator.  Tracheostomy and gastrostomy tube were placed during his hospitalization.  [facility]  Records from that stay are not available at this time.LTAC nevertheless, the patient was weaned off the  ventilator, and he was discharged to a skilled nursing facility on 08/17/2017.  Unfortunately, the patient developed a fever up to 101.0 F on 08/18/2017.  As result, the patient was brought to emergency department for further evaluation.  He was noted to have a fever up to 102.5 F in the emergency department with tachycardia and hypoxia.  He was started on vancomycin and Zosyn and given intravenous fluids for sepsis.   At baseline since the patient's hospitalization and stay at [facility], the patient has been essentially bedbound.  He is noncommunicative verbally.  He is unable to voice or identify his needs.  He has been receiving enteral feedings and has remained completely n.p.o.  He is requiring 100% care for all his ADLs   Assessment/Plan: Sepsis -Secondary to pneumonia  -there is likely a degree of aspiration -Check procalcitonin -Lactic acid peaked 1.88 -Tracheal aspirate for Gram stain and culture -Continue IV fluids -Discontinue Zosyn -Start Merrem -MRSA screen  Acute on chronic respiratory failure with hypoxia -The patient will likely need to be placed back on a ventilator at this time with pressure support -Consult pulmonary medicine -Pulmonary hygiene -Start bronchodilators Personally reviewed chest x-ray--LLL opacity  HAP/aspiration pneumonia -As discussed above  Right upper extremity DVT -Diagnosed by ultrasound 07/14/2017 -Secondary to PICC line which has been removed -Continue apixaban right upper extremity DV  Cervical contusion -Status post cervical fusion and cervical laminectomy -Secondary to his unwitnessed fall in December 2018  FEN -Start half-normal saline with potassium -Start enteral feedings -Check magnesium and phosphorus -A.m. CMP  Essential hypertension -Continue metoprolol tartrate--changed to IV for now  Critical care polyneuropathy -Patient  will need PT evaluation once he is stabilized  Recent stroke -pt with intolerance to statins--continue  lovaza -PT eval once stable  Goals of care -Long discussion with the patient's spouse at bedside--she seems to have unrealistic expectations -Consult palliative medicine -Patient remains full code/Full scope of care    Disposition Plan:   SNF once stable Family Communication:   Spouse updated at bedside--Total time spent 65 minutes.  Greater than 50% spent face to face counseling and coordinating care.   Consultants:  pulmonary  Code Status:  FULL   DVT Prophylaxis:  apixaban   Procedures: As Listed in Progress Note Above  Antibiotics: vanco 2/3 Zosyn 2/3 Merrem 2/3  The patient is critically ill with multiple organ systems failure and requires high complexity decision making for assessment and support, frequent evaluation and titration of therapies, application of advanced monitoring technologies and extensive interpretation of multiple databases.  Critical care time - 65 mins.      Subjective: Patient is trached and pegged.  He is unable to follow any commands.  He occasionally grimaces to perihepatic stimuli.  There is no reports of vomiting, diarrhea, respiratory distress.  Objective: Vitals:   08/19/17 0500 08/19/17 0600 08/19/17 0700 08/19/17 0737  BP: 110/67 119/70 115/65   Pulse: (!) 106 (!) 118 (!) 119 (!) 119  Resp: (!) 36 (!) 37 (!) 39 (!) 37  Temp:    98.4 F (36.9 C)  TempSrc:    Axillary  SpO2: 92% 94% (!) 89% (!) 89%  Weight:      Height:        Intake/Output Summary (Last 24 hours) at 08/19/2017 0742 Last data filed at 08/19/2017 0429 Gross per 24 hour  Intake 2000 ml  Output 450 ml  Net 1550 ml   Weight change:  Exam:   General:  Pt is alert, follows commands appropriately, not in acute distress  HEENT: No icterus, No thrush, No neck mass, [place]/AT  Cardiovascular: RRR, S1/S2, no rubs, no gallops  Respiratory: Diminished breath sounds bilateral, left greater than right.  Bibasilar rales.  Abdomen: Soft/+BS, non tender, non distended, no  guarding; gastrostomy tube site without erythema or drainage.  Extremities: No edema, No lymphangitis, No petechiae, No rashes, no synovitis   Data Reviewed: I have personally reviewed following labs and imaging studies Basic Metabolic Panel: Recent Labs  Lab 08/12/17 0801 08/18/17 2145 08/19/17 0450  NA 144 145 146*  K 3.6 4.1 3.6  CL 111 109 112*  CO2 22 26 24   GLUCOSE 109* 147* 129*  BUN 31* 39* 33*  CREATININE 0.42* 0.54* 0.39*  CALCIUM 8.4* 9.0 8.9   Liver Function Tests: Recent Labs  Lab 08/18/17 2145  AST 55*  ALT 97*  ALKPHOS 414*  BILITOT 0.6  PROT 7.3  ALBUMIN 2.3*   No results for input(s): LIPASE, AMYLASE in the last 168 hours. No results for input(s): AMMONIA in the last 168 hours. Coagulation Profile: Recent Labs  Lab 08/17/17 0548 08/18/17 2145  INR 1.20 1.37   CBC: Recent Labs  Lab 08/18/17 2145 08/19/17 0450  WBC 19.4* 21.9*  NEUTROABS 16.9  --   HGB 11.8* 10.7*  HCT 39.7 36.7*  MCV 87.6 88.2  PLT 348 298   Cardiac Enzymes: No results for input(s): CKTOTAL, CKMB, CKMBINDEX, TROPONINI in the last 168 hours. BNP: Invalid input(s): POCBNP CBG: No results for input(s): GLUCAP in the last 168 hours. HbA1C: No results for input(s): HGBA1C in the last 72 hours. Urine analysis:  Component Value Date/Time   COLORURINE AMBER (A) 08/18/2017 2146   APPEARANCEUR HAZY (A) 08/18/2017 2146   LABSPEC 1.025 08/18/2017 2146   PHURINE 6.0 08/18/2017 2146   GLUCOSEU NEGATIVE 08/18/2017 2146   HGBUR MODERATE (A) 08/18/2017 2146   BILIRUBINUR NEGATIVE 08/18/2017 2146   KETONESUR NEGATIVE 08/18/2017 2146   PROTEINUR 30 (A) 08/18/2017 2146   NITRITE NEGATIVE 08/18/2017 2146   LEUKOCYTESUR NEGATIVE 08/18/2017 2146   Sepsis Labs: @LABRCNTIP (procalcitonin:4,lacticidven:4) ) Recent Results (from the past 240 hour(s))  Culture, Urine     Status: None   Collection Time: 08/09/17  8:24 AM  Result Value Ref Range Status   Specimen Description  URINE, CLEAN CATCH  Final   Special Requests Normal  Final   Culture NO GROWTH  Final   Report Status 08/10/2017 FINAL  Final  Culture, respiratory (NON-Expectorated)     Status: None   Collection Time: 08/09/17  8:24 AM  Result Value Ref Range Status   Specimen Description TRACHEAL ASPIRATE  Final   Special Requests Normal  Final   Gram Stain   Final    RARE WBC PRESENT, PREDOMINANTLY PMN MODERATE GRAM NEGATIVE RODS RARE GRAM POSITIVE COCCI IN PAIRS IN CLUSTERS RARE GRAM POSITIVE RODS    Culture MODERATE ENTEROBACTER AEROGENES  Final   Report Status 08/11/2017 FINAL  Final   Organism ID, Bacteria ENTEROBACTER AEROGENES  Final      Susceptibility   Enterobacter aerogenes - MIC*    CEFAZOLIN >=64 RESISTANT Resistant     CEFEPIME <=1 SENSITIVE Sensitive     CEFTAZIDIME <=1 SENSITIVE Sensitive     CEFTRIAXONE <=1 SENSITIVE Sensitive     CIPROFLOXACIN <=0.25 SENSITIVE Sensitive     GENTAMICIN <=1 SENSITIVE Sensitive     IMIPENEM 0.5 SENSITIVE Sensitive     TRIMETH/SULFA <=20 SENSITIVE Sensitive     PIP/TAZO <=4 SENSITIVE Sensitive     * MODERATE ENTEROBACTER AEROGENES  Culture, blood (routine x 2)     Status: None   Collection Time: 08/09/17 10:05 AM  Result Value Ref Range Status   Specimen Description BLOOD LEFT ARM  Final   Special Requests   Final    BOTTLES DRAWN AEROBIC AND ANAEROBIC Blood Culture adequate volume   Culture NO GROWTH 5 DAYS  Final   Report Status 08/14/2017 FINAL  Final  Culture, blood (routine x 2)     Status: None   Collection Time: 08/09/17 10:21 AM  Result Value Ref Range Status   Specimen Description BLOOD LEFT ARM  Final   Special Requests   Final    BOTTLES DRAWN AEROBIC AND ANAEROBIC Blood Culture adequate volume   Culture NO GROWTH 5 DAYS  Final   Report Status 08/14/2017 FINAL  Final  Culture, blood (Routine x 2)     Status: None (Preliminary result)   Collection Time: 08/18/17  9:42 PM  Result Value Ref Range Status   Specimen Description  LEFT ANTECUBITAL  Final   Special Requests   Final    BOTTLES DRAWN AEROBIC AND ANAEROBIC Blood Culture adequate volume   Culture   Final    NO GROWTH < 12 HOURS Performed at [facility], [address] [place], [place] [address]    Report Status PENDING  Incomplete  Culture, blood (Routine x 2)     Status: None (Preliminary result)   Collection Time: 08/18/17  9:51 PM  Result Value Ref Range Status   Specimen Description BLOOD LEFT FOREARM  Final   Special Requests  Final    BOTTLES DRAWN AEROBIC ONLY Blood Culture adequate volume   Culture   Final    NO GROWTH < 12 HOURS Performed at [facility], [address], [place], [place] [address]    Report Status PENDING  Incomplete     Scheduled Meds: . allopurinol  100 mg Per Tube Daily  . amantadine  100 mg Per Tube BID  . apixaban  2.5 mg Per Tube BID  . chlorhexidine  15 mL Mouth Rinse BID  . collagenase  1 application Topical Daily  . famotidine  20 mg Per Tube BID  . mouth rinse  15 mL Mouth Rinse q12n4p  . metoprolol tartrate  37.5 mg Per Tube Daily  . modafinil  100 mg Per Tube Daily  . omega-3 acid ethyl esters  1 g Per Tube Daily  . polyethylene glycol  17 g Oral Daily  . sertraline  50 mg Per Tube Daily  . tamsulosin  0.4 mg Oral Daily   Continuous Infusions: . lactated ringers 75 mL/hr at 08/19/17 0134  . piperacillin-tazobactam (ZOSYN)  IV 3.375 g (08/19/17 6045)    Procedures/Studies: Ct Abdomen Wo Contrast  Result Date: 08/03/2017 CLINICAL DATA:  Gastrostomy tube evaluation EXAM: CT ABDOMEN WITHOUT CONTRAST TECHNIQUE: Multidetector CT imaging of the abdomen was performed following the standard protocol without IV contrast. COMPARISON:  None. FINDINGS: Lower chest: Bibasilar dependent consolidation left greater than right. Hepatobiliary: Unremarkable Pancreas: Unremarkable Spleen: Unremarkable Adrenals/Urinary Tract: Vascular calcifications are noted. Renal parenchyma is within normal limits. Adrenal glands are  within normal limits. Stomach/Bowel: NG tube is in place. The stomach is positioned between the liver and transverse colon. No obvious mass in the colon. No evidence of small-bowel obstruction. Vascular/Lymphatic: Atherosclerotic calcifications of the aorta and iliac arteries. Other: No free fluid. Musculoskeletal: No vertebral compression deformity. Degenerative changes in the hips and lumbar spine. Additional imaging through the pelvis: Bladder is unremarkable. Prostate is within normal limits. IMPRESSION: There is favorable anatomy for gastrostomy tube placement. Bibasilar pulmonary consolidation left greater than right. Electronically Signed   By: Jolaine Click M.D.   On: 08/03/2017 08:15   [name]  Result Date: 07/25/2017 CLINICAL DATA:  Ventilator support.  Multiple strokes. EXAM: MRI HEAD WITHOUT CONTRAST TECHNIQUE: Multiplanar, multiecho pulse sequences of the brain and surrounding structures were obtained without intravenous contrast. COMPARISON:  CT studies 06/29/2017.  MRI 04/25/2017. FINDINGS: Brain: Diffusion imaging shows a 4 mm acute infarction in the cerebral peduncle on the right. No other acute infarction. There are old small vessel cerebellar infarctions. There is old infarction within the left para median pons. There chronic small-vessel ischemic changes of the hemispheric white matter. No large vessel territory infarction. Chronic ventriculomegaly without change. Previously coiled circle of Willis aneurysm. I suspect that there is a thin subdural hematoma in the posterior fossa on the right. This could be artifact, given pronounced susceptibility artifact in the region, but I think is probably real. Maximal thickness 3 mm. No mass effect. The patient has some layering blood products in the occipital horns of both lateral ventricles. Vascular: Major vessels at the base of the brain show flow. Skull and upper cervical spine: It appears that the patient has had extensive cervical  decompression and fusion, possibly with extension to the occiput. Sinuses/Orbits: Clear/normal Other: None IMPRESSION: Acute 4 mm infarction right cerebral peduncle. Thin subdural hematoma posterior fossa on the right, maximal thickness 3 mm. Small amount of blood dependent in the occipital horns of both  lateral ventricles. Question if these hemorrhagic findings relate to the recent craniocervical surgical procedure. Chronic ischemic changes elsewhere throughout the brain. Chronic ventriculomegaly. Electronically Signed   By: Paulina Fusi M.D.   On: 07/25/2017 14:26   Ir Gastrostomy Tube Mod Sed  Result Date: 08/06/2017 INDICATION: Dysphagia EXAM: TWENTY FRENCH PULL-THROUGH GASTROSTOMY Date:  08/06/2017 08/06/2017 9:36 am Radiologist:  M. Ruel Favors, MD Guidance:  Ultrasound and fluoroscopic MEDICATIONS: 1 g Ancef; Antibiotics were administered within 1 hour of the procedure. Glucagon 0.5 mg IV ANESTHESIA/SEDATION: Versed 0.5 mg IV; Fentanyl 25 mcg IV Moderate Sedation Time:  15 The patient was continuously monitored during the procedure by the interventional radiology nurse under my direct supervision. CONTRAST:  10 cc Isovue-300-administered into the gastric lumen. FLUOROSCOPY TIME:  Fluoroscopy Time: 2 minutes 30 seconds (12 mGy). COMPLICATIONS: None immediate. PROCEDURE: Informed consent was obtained from the patient following explanation of the procedure, risks, benefits and alternatives. The patient understands, agrees and consents for the procedure. All questions were addressed. A time out was performed. Maximal barrier sterile technique utilized including caps, mask, sterile gowns, sterile gloves, large sterile drape, hand hygiene, and betadine prep. The left upper quadrant was sterilely prepped and draped. An oral gastric catheter was inserted into the stomach under fluoroscopy. The existing nasogastric feeding tube was removed. Air was injected into the stomach for insufflation and visualization under  fluoroscopy. The air distended stomach was confirmed beneath the anterior abdominal wall in the frontal and lateral projections. Under sterile conditions and local anesthesia, a 17 gauge trocar needle was utilized to access the stomach percutaneously beneath the left subcostal margin. Needle position was confirmed within the stomach under biplane fluoroscopy. Contrast injection confirmed position also. A single T tack was deployed for gastropexy. Over an Amplatz guide wire, a 9-French sheath was inserted into the stomach. A snare device was utilized to capture the oral gastric catheter. The snare device was pulled retrograde from the stomach up the esophagus and out the oropharynx. The 20-French pull-through gastrostomy was connected to the snare device and pulled antegrade through the oropharynx down the esophagus into the stomach and then through the percutaneous tract external to the patient. The gastrostomy was assembled externally. Contrast injection confirms position in the stomach. Images were obtained for documentation. The patient tolerated procedure well. No immediate complication. IMPRESSION: Fluoroscopic insertion of a 20-French "pull-through" gastrostomy. Electronically Signed   By: Judie Petit.  Shick M.D.   On: 08/06/2017 09:52   Dg Chest Port 1 View  Result Date: 08/19/2017 CLINICAL DATA:  Follow-up healthcare associated pneumonia, sepsis EXAM: PORTABLE CHEST 1 VIEW COMPARISON:  08/18/2017 FINDINGS: Moderate left pleural effusion, mildly increased. Associated left lower lobe opacity, favoring compressive atelectasis, pneumonia not excluded. Mild left suprahilar opacity, new, favoring asymmetric interstitial edema given the rapid change. Right lung is essentially clear. No pneumothorax. Tracheostomy in satisfactory position. The heart is top-normal in size. IMPRESSION: Mild left suprahilar opacity, new, favoring asymmetric interstitial edema given the rapid change. Moderate left pleural effusion, increased.  Associated left lower lobe opacity, favoring compressive atelectasis, pneumonia not excluded. Electronically Signed   By: Charline Bills M.D.   On: 08/19/2017 07:14   Dg Chest Portable 1 View  Result Date: 08/18/2017 CLINICAL DATA:  Chronic ventilator dependent respiratory failure, presenting with fever, tachycardia, tachypnea and hypoxia. Personal history of stroke. EXAM: PORTABLE CHEST 1 VIEW COMPARISON:  07/22/2017, 11/23/2016 and earlier. FINDINGS: Tracheostomy tube tip in satisfactory position below the thoracic inlet. Cardiac silhouette upper normal in size for AP  portable technique, unchanged. Dense airspace consolidation involving the left lower lobe. Lungs otherwise clear. 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 IMPRESSION: 1. Dense left lower lobe pneumonia and/or atelectasis. 2. 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 Electronically Signed   By: Hulan Saas M.D.   On: 08/18/2017 22:15   Dg Chest Port 1 View  Result Date: 07/22/2017 CLINICAL DATA:  Respiratory failure EXAM: PORTABLE CHEST 1 VIEW COMPARISON:  11/23/2016 FINDINGS: Tracheostomy in satisfactory position. Enteric tube courses into the stomach. Left arm PICC terminates in the lower SVC. Left lower lobe opacity, likely a combination of atelectasis and small left pleural effusion. The heart is normal in size. IMPRESSION: Left lower lobe opacity, likely combination of atelectasis and small left pleural effusion. Tracheostomy in satisfactory position. Additional support apparatus as above. Electronically Signed   By: Charline Bills M.D.   On: 07/22/2017 09:56   Dg Abd Portable 1v  Result Date: 07/20/2017 CLINICAL DATA:  77 y/o  M; enteric tube. EXAM: PORTABLE ABDOMEN - 1 VIEW COMPARISON:  None. FINDINGS: Enteric tube tip projects over gastric body. Normal bowel gas body. Multilevel degenerative changes of lumbar spine. IMPRESSION: Enteric tube tip projects over gastric body. Electronically Signed    By: Mitzi Hansen M.D.   On: 07/20/2017 21:53    Catarina Hartshorn, DO  [facility] Pager [phone]  If 7PM-7AM, please contact night-coverage [email or web address] Password [email or web address] 08/19/2017, 7:42 AM   LOS: 1 day

## 2017-08-19 NOTE — Progress Notes (Signed)
He has a noncuffed trache tube so to provide adequate ventilation  This was changed to cuffed #4 trache tube

## 2017-08-19 NOTE — Progress Notes (Signed)
Lab called blood culture gram positive cocci in aerobic bottle. Floor coverage MD notified.

## 2017-08-19 NOTE — Consult Note (Signed)
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 Dressing procedure/placement/frequency: Right heel identified last pm to be erythematous (Stage 1) has resolved with implementation of pressure redistribution heel boots placed bilaterally. Patient provided with orders for wound care using collagenase (Santyl) and silicone foam dressings and Nursing provided with guidance for turning and repositioning and to limit time spent in the supine position.  WOC nursing team will not follow, but will remain available to this patient, the nursing and medical teams.  Please re-consult if needed. Thanks, Ladona Mow, MSN, RN, GNP, [facility]  Pager# [phone]

## 2017-08-19 NOTE — Progress Notes (Signed)
Fentanyl drip discontinued. 225 ml left over witnessed with Venida Jarvis , RN.  Cristie Hem, RN 08/19/17 3:40 PM

## 2017-08-20 ENCOUNTER — Inpatient Hospital Stay (HOSPITAL_COMMUNITY): Payer: Medicare Other

## 2017-08-20 DIAGNOSIS — L89153 Pressure ulcer of sacral region, stage 3: Secondary | ICD-10-CM

## 2017-08-20 DIAGNOSIS — J9601 Acute respiratory failure with hypoxia: Secondary | ICD-10-CM

## 2017-08-20 DIAGNOSIS — J189 Pneumonia, unspecified organism: Secondary | ICD-10-CM

## 2017-08-20 DIAGNOSIS — A412 Sepsis due to unspecified staphylococcus: Secondary | ICD-10-CM

## 2017-08-20 LAB — CBC
HEMATOCRIT: 33.5 % — AB (ref 39.0–52.0)
HEMOGLOBIN: 9.8 g/dL — AB (ref 13.0–17.0)
MCH: 26.1 pg (ref 26.0–34.0)
MCHC: 29.3 g/dL — ABNORMAL LOW (ref 30.0–36.0)
MCV: 89.1 fL (ref 78.0–100.0)
PLATELETS: 263 10*3/uL (ref 150–400)
RBC: 3.76 MIL/uL — AB (ref 4.22–5.81)
RDW: 16.6 % — ABNORMAL HIGH (ref 11.5–15.5)
WBC: 20.9 10*3/uL — ABNORMAL HIGH (ref 4.0–10.5)

## 2017-08-20 LAB — BLOOD GAS, ARTERIAL
Acid-Base Excess: 2.3 mmol/L — ABNORMAL HIGH (ref 0.0–2.0)
Bicarbonate: 26.8 mmol/L (ref 20.0–28.0)
DRAWN BY: 22223
FIO2: 70
O2 Saturation: 98.4 %
PEEP: 5 cmH2O
PH ART: 7.487 — AB (ref 7.350–7.450)
RATE: 20 resp/min
VT: 620 mL
pCO2 arterial: 34 mmHg (ref 32.0–48.0)
pO2, Arterial: 120 mmHg — ABNORMAL HIGH (ref 83.0–108.0)

## 2017-08-20 LAB — COMPREHENSIVE METABOLIC PANEL
ALK PHOS: 260 U/L — AB (ref 38–126)
ALT: 50 U/L (ref 17–63)
AST: 24 U/L (ref 15–41)
Albumin: 1.9 g/dL — ABNORMAL LOW (ref 3.5–5.0)
Anion gap: 13 (ref 5–15)
BILIRUBIN TOTAL: 1.2 mg/dL (ref 0.3–1.2)
BUN: 34 mg/dL — AB (ref 6–20)
CO2: 23 mmol/L (ref 22–32)
CREATININE: 0.41 mg/dL — AB (ref 0.61–1.24)
Calcium: 8.7 mg/dL — ABNORMAL LOW (ref 8.9–10.3)
Chloride: 108 mmol/L (ref 101–111)
Glucose, Bld: 93 mg/dL (ref 65–99)
Potassium: 3.7 mmol/L (ref 3.5–5.1)
Sodium: 144 mmol/L (ref 135–145)
TOTAL PROTEIN: 6.1 g/dL — AB (ref 6.5–8.1)

## 2017-08-20 LAB — BLOOD CULTURE ID PANEL (REFLEXED)
ACINETOBACTER BAUMANNII: NOT DETECTED
Acinetobacter baumannii: NOT DETECTED
CANDIDA ALBICANS: NOT DETECTED
CANDIDA GLABRATA: NOT DETECTED
CANDIDA GLABRATA: NOT DETECTED
CANDIDA KRUSEI: NOT DETECTED
Candida albicans: NOT DETECTED
Candida krusei: NOT DETECTED
Candida parapsilosis: NOT DETECTED
Candida parapsilosis: NOT DETECTED
Candida tropicalis: NOT DETECTED
Candida tropicalis: NOT DETECTED
ENTEROBACTER CLOACAE COMPLEX: NOT DETECTED
ENTEROCOCCUS SPECIES: NOT DETECTED
ESCHERICHIA COLI: NOT DETECTED
ESCHERICHIA COLI: NOT DETECTED
Enterobacter cloacae complex: NOT DETECTED
Enterobacteriaceae species: NOT DETECTED
Enterobacteriaceae species: NOT DETECTED
Enterococcus species: NOT DETECTED
Haemophilus influenzae: NOT DETECTED
Haemophilus influenzae: NOT DETECTED
Klebsiella oxytoca: NOT DETECTED
Klebsiella oxytoca: NOT DETECTED
Klebsiella pneumoniae: NOT DETECTED
Klebsiella pneumoniae: NOT DETECTED
LISTERIA MONOCYTOGENES: NOT DETECTED
LISTERIA MONOCYTOGENES: NOT DETECTED
Methicillin resistance: DETECTED — AB
Methicillin resistance: DETECTED — AB
NEISSERIA MENINGITIDIS: NOT DETECTED
Neisseria meningitidis: NOT DETECTED
PROTEUS SPECIES: NOT DETECTED
PSEUDOMONAS AERUGINOSA: NOT DETECTED
PSEUDOMONAS AERUGINOSA: NOT DETECTED
Proteus species: NOT DETECTED
SERRATIA MARCESCENS: NOT DETECTED
STAPHYLOCOCCUS AUREUS BCID: NOT DETECTED
STAPHYLOCOCCUS SPECIES: DETECTED — AB
STREPTOCOCCUS AGALACTIAE: NOT DETECTED
STREPTOCOCCUS AGALACTIAE: NOT DETECTED
STREPTOCOCCUS PNEUMONIAE: NOT DETECTED
STREPTOCOCCUS PYOGENES: NOT DETECTED
STREPTOCOCCUS SPECIES: NOT DETECTED
Serratia marcescens: NOT DETECTED
Staphylococcus aureus (BCID): NOT DETECTED
Staphylococcus species: DETECTED — AB
Streptococcus pneumoniae: NOT DETECTED
Streptococcus pyogenes: NOT DETECTED
Streptococcus species: NOT DETECTED

## 2017-08-20 LAB — URINE CULTURE: Culture: NO GROWTH

## 2017-08-20 LAB — ECHOCARDIOGRAM COMPLETE
Height: 72 in
WEIGHTICAEL: 2641.99 [oz_av]

## 2017-08-20 LAB — MAGNESIUM: MAGNESIUM: 1.9 mg/dL (ref 1.7–2.4)

## 2017-08-20 LAB — PHOSPHORUS: PHOSPHORUS: 3.2 mg/dL (ref 2.5–4.6)

## 2017-08-20 LAB — PROCALCITONIN: Procalcitonin: 0.44 ng/mL

## 2017-08-20 LAB — BRAIN NATRIURETIC PEPTIDE: B NATRIURETIC PEPTIDE 5: 66 pg/mL (ref 0.0–100.0)

## 2017-08-20 MED ORDER — CHLORHEXIDINE GLUCONATE 0.12% ORAL RINSE (MEDLINE KIT)
15.0000 mL | Freq: Two times a day (BID) | OROMUCOSAL | Status: DC
  Administered 2017-08-20 – 2017-08-27 (×15): 15 mL via OROMUCOSAL

## 2017-08-20 MED ORDER — VANCOMYCIN HCL 10 G IV SOLR
1500.0000 mg | Freq: Once | INTRAVENOUS | Status: AC
  Administered 2017-08-20: 1500 mg via INTRAVENOUS
  Filled 2017-08-20: qty 1500

## 2017-08-20 MED ORDER — VANCOMYCIN HCL IN DEXTROSE 1-5 GM/200ML-% IV SOLN
1000.0000 mg | Freq: Two times a day (BID) | INTRAVENOUS | Status: AC
  Administered 2017-08-20 – 2017-08-23 (×7): 1000 mg via INTRAVENOUS
  Filled 2017-08-20 (×7): qty 200

## 2017-08-20 MED ORDER — ORAL CARE MOUTH RINSE
15.0000 mL | Freq: Four times a day (QID) | OROMUCOSAL | Status: DC
  Administered 2017-08-20 – 2017-08-27 (×28): 15 mL via OROMUCOSAL

## 2017-08-20 MED ORDER — VITAL AF 1.2 CAL PO LIQD
1000.0000 mL | ORAL | Status: DC
  Administered 2017-08-20 – 2017-08-23 (×4): 1000 mL
  Filled 2017-08-20 (×5): qty 1000

## 2017-08-20 MED ORDER — PRO-STAT SUGAR FREE PO LIQD
30.0000 mL | Freq: Every day | ORAL | Status: DC
  Administered 2017-08-20 – 2017-08-23 (×4): 30 mL
  Filled 2017-08-20 (×4): qty 30

## 2017-08-20 NOTE — Clinical Social Work Note (Signed)
Clinical Social Work Assessment  Patient Details  Name: Richard Mcpherson MRN: [id number] Date of Birth: 10/10/1940  Date of referral:  08/20/17               Reason for consult:  Discharge Planning                Permission sought to share information with:  Chartered certified accountant granted to share information::  Yes, Verbal Permission Granted  Name::        Agency::  [facility]  Relationship::     Contact Information:     Housing/Transportation Living arrangements for the past 2 months:  [place], Programmer, multimedia of Information:  Spouse, Facility Patient Interpreter Needed:  None Criminal Activity/Legal Involvement Pertinent to Current Situation/Hospitalization:  No - Comment as needed Significant Relationships:  Spouse Lives with:  Spouse Do you feel safe going back to the place where you live?  Yes Need for family participation in patient care:  Yes (Comment)  Care giving concerns: Pt needs continued SNF rehab at dc.   Social Worker assessment / plan: Pt is a 77 year old male admitted from [facility]. Met with pt's wife this AM to explain LCSW role. [name] states pt came to [place] from [place]. He had just arrived at [facility] Friday 2/1. Prior to that, pt had been at [facility]. Pt has a trach and a PEG. [name] would like pt to return to [facility] at dc.   Pt currently has bacteremia, pneumonia per MD. He has requested a Palliative Care consult. Tammy from [facility] was here and saw pt and his wife. Spoke to [facility] as well. She states that they can accept pt back as long as he is 28 percent or less on the O2.   Will follow and assist as needed depending on the course of pt's stay.  Employment status:  Retired Forensic scientist:  Medicare PT Recommendations:  Not assessed at this time Information / Referral to community resources:     Patient/Family's Response to care: [name] accepting of care.  Patient/Family's Understanding of and Emotional  Response to Diagnosis, Current Treatment, and Prognosis: Per MD, pt's wife does not appear to be understanding the scope of pt's condition. [name] had pleasant affect and did not appear to have emotional distress at this time.   Emotional Assessment Appearance:  Appears stated age Attitude/Demeanor/Rapport:  Unable to Assess Affect (typically observed):  Unable to Assess Orientation:  Oriented to Self, Oriented to Place, Oriented to Situation Alcohol / Substance use:  Not Applicable Psych involvement (Current and /or in the community):  No (Comment)  Discharge Needs  Concerns to be addressed:  Discharge Planning Concerns Readmission within the last 30 days:  No Current discharge risk:  None Barriers to Discharge:  No Barriers Identified   Shade Flood, LCSW 08/20/2017, 11:02 AM

## 2017-08-20 NOTE — Progress Notes (Signed)
I will not be available tomorrow due to a death in the family 

## 2017-08-20 NOTE — Progress Notes (Signed)
Echocardiogram 2D Echocardiogram has been performed.  Richard Mcpherson 08/20/2017, 2:51 PM

## 2017-08-20 NOTE — Progress Notes (Signed)
Initial Nutrition Assessment  DOCUMENTATION CODES:   Non-severe (moderate) malnutrition in context of acute illness/injury(CVA as well as cervical fx following a fall )  INTERVENTION:  Initiate Vital 1.2 @ 50 ml/hr via PEG (1200 ml every 24 hr).  Add Prostat 30 ml daily via PEG.  Tube feeding regimen provides 1540 kcal, 105 grams of protein, and 973 ml of H2O.    Propofol @ 13.5 ml/hr  providing 356 kcal lipids every 24 hr.  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 Unable to maintain PO orally necessciating PEG placement . Has been NPO since CVA in December. ) as evidenced by NPO status, per patient/family report, percent weight loss 8% < 90 days. Moderate muscle and fat depletions.   GOAL:   Provide needs based on ASPEN/SCCM guidelines MONITOR:   TF tolerance, Skin, Weight trends, Labs, I & O's  REASON FOR ASSESSMENT:   Consult Enteral/tube feeding initiation and management  ASSESSMENT:  Patient was admitted to [facility] on Friday from [facility]).    Has a hx of CVA and is s/p Trach and PEG placement following what initially started as a  fall at home.   The patient is sedated -spouse is here and helped with pt hx. He has been NPO since CVA in December. According to care everywhere he was discharged to [facility] with Isosource HN @ 70 ml /hr continuous feeding.   He presents to [facility] with bacteremia and PNA. His trach has been changed to cuffed trach tube and has been on the vent since yesterday.  According to the chart he weighed 179 lb on December 17th. Comparing the two- his current weight shows a significant decrease of 8% < 90 days.      Labs: BUN 34 and Mag 1.9 , Phos 3.2  Meds: Omega 3 and miralax. Propofol @ 13.5 ml/hr delivering 356 kcal lipids every 24 hrs.  IVF-0.45  NaCl + KCL (20 mEq/l)  @ 75 ml /hr  VENT MV: 11.6  L/min Temp (24hrs), Avg:99 F (37.2 C), Min:97.8 F (36.6 C),  Max:101.3 F (38.5 C)   Recent Labs  Lab 08/18/17 2145 08/19/17 0450 08/20/17 0405  NA 145 146* 144  K 4.1 3.6 3.7  CL 109 112* 108  CO2 26 24 23   BUN 39* 33* 34*  CREATININE 0.54* 0.39* 0.41*  CALCIUM 9.0 8.9 8.7*  MG  --   --  1.9  PHOS  --   --  3.2  GLUCOSE 147* 129* 93     NUTRITION - FOCUSED PHYSICAL EXAM: He has moderate muscle loss to temporalis, clavicles and deltoids and moderate buccal, orbital, pectorial fat depletion. NO edema to lower extremities but has moderate edema bilateral hands.    Diet Order:  Diet NPO time specified  EDUCATION NEEDS:    Skin:  Stage III to coccyx per nursing   Last BM:   today  Height:   Ht Readings from Last 1 Encounters:  08/19/17 6' (1.829 m)    Weight:   Wt Readings from Last 1 Encounters:  08/19/17 165 lb 2 oz (74.9 kg)    Ideal Body Weight:  81 kg  BMI:  Body mass index is 22.39 kg/m.  Estimated Nutritional Needs:   Kcal:  1818  Protein:  98-113 gr  Fluid:  >1800 ml daily   Royann Shivers MS,RD,CSG,LDN Office: [phone] Pager: [phone]

## 2017-08-20 NOTE — Progress Notes (Signed)
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  He has been on and off a ventilator since his stroke.  He came to the emergency department from skilled care facility with fever tachypnea and decreased oxygen saturation.  He was found to have white blood count of 19,400 lactate of 1.88 chest x-ray showing left lower lobe consolidation and he had a temperature of 102.  He was placed on high flow oxygen via nonrebreather mask over his tracheostomy and initially did fairly well but later developed increasing problems with increased respiratory rate and hypoxia.  His tracheostomy tube was exchanged for a cuffed tracheostomy tube and he was placed on mechanical ventilator yesterday.  No new complaints are noted.  His wife spent the night with him and says that he did okay yesterday.  Objective: Vital signs in last 24 hours: Temp:  [97.7 F (36.5 C)-101.3 F (38.5 C)] 98.6 F (37 C) (02/04 0400) Pulse Rate:  [94-125] 104 (02/04 0700) Resp:  [13-55] 20 (02/04 0700) BP: (92-126)/(56-81) 102/58 (02/04 0700) SpO2:  [91 %-100 %] 98 % (02/04 0700) FiO2 (%):  [65 %-100 %] 65 % (02/04 0500) Weight change:     Intake/Output from previous day: 02/03 0701 - 02/04 0700 In: 2076.5 [I.V.:1676.5; IV Piggyback:400] Out: 550 [Urine:550]  PHYSICAL EXAM General appearance: He opens his eyes to verbal stimuli but has not been verbal even when he had a non-cuffed tracheostomy tube in place. Resp: rhonchi bilaterally Cardio: regular rate and rhythm, S1, S2 normal, no murmur, click, rub or gallop GI: soft, non-tender; bowel sounds normal; no masses,  no organomegaly Extremities: extremities normal, atraumatic, no cyanosis or edema Skin turgor fair  Lab Results:  Results for orders placed or performed during the hospital encounter of 08/18/17 (from the past 48 hour(s))  Culture, blood (Routine x 2)     Status:  None (Preliminary result)   Collection Time: 08/18/17  9:42 PM  Result Value Ref Range   Specimen Description      LEFT ANTECUBITAL Performed at [facility], [address], [place], [place] [address]    Special Requests      BOTTLES DRAWN AEROBIC AND ANAEROBIC Blood Culture adequate volume Performed at [facility], [address] [place], [place] [address]    Culture  Setup Time      GRAM POSITIVE COCCI Gram Stain Report Called to,Read Back By and Verified With: GAMMONS,S. AT 0203 ON 08/20/2017 BY EVA Performed at [facility] Organism ID to follow    Culture      NO GROWTH 2 DAYS Performed at [facility], [address], [place], [place] [address]    Report Status PENDING   Comprehensive metabolic panel     Status: Abnormal   Collection Time: 08/18/17  9:45 PM  Result Value Ref Range   Sodium 145 135 - 145 mmol/L   Potassium 4.1 3.5 - 5.1 mmol/L   Chloride 109 101 - 111 mmol/L   CO2 26 22 - 32 mmol/L   Glucose, Bld 147 (H) 65 - 99 mg/dL   BUN 39 (H) 6 - 20 mg/dL   Creatinine, Ser 0.54 (L) 0.61 - 1.24 mg/dL   Calcium 9.0 8.9 - 10.3 mg/dL   Total Protein 7.3 6.5 - 8.1 g/dL   Albumin 2.3 (L) 3.5 - 5.0 g/dL   AST 55 (H) 15 - 41 U/L   ALT 97 (H) 17 - 63 U/L  Alkaline Phosphatase 414 (H) 38 - 126 U/L   Total Bilirubin 0.6 0.3 - 1.2 mg/dL   GFR calc non Af Amer >60 >60 mL/min   GFR calc Af Amer >60 >60 mL/min    Comment: (NOTE) The eGFR has been calculated using the CKD EPI equation. This calculation has not been validated in all clinical situations. eGFR's persistently <60 mL/min signify possible Chronic Kidney Disease.    Anion gap 10 5 - 15    Comment: Performed at [facility], [address], [place], [place] [address]  CBC with Differential     Status: Abnormal   Collection Time: 08/18/17  9:45 PM  Result Value Ref Range   WBC 19.4 (H) 4.0 - 10.5 K/uL   RBC 4.53 4.22 - 5.81 MIL/uL   Hemoglobin 11.8 (L) 13.0 - 17.0 g/dL   HCT 39.7 39.0 - 52.0 %   MCV  87.6 78.0 - 100.0 fL   MCH 26.0 26.0 - 34.0 pg   MCHC 29.7 (L) 30.0 - 36.0 g/dL   RDW 16.3 (H) 11.5 - 15.5 %   Platelets 348 150 - 400 K/uL   Neutrophils Relative % 86 %   Neutro Abs 16.9 1.7 - 7.7 K/uL   Lymphocytes Relative 7 %   Lymphs Abs 1.3 0.7 - 4.0 K/uL   Monocytes Relative 6 %   Monocytes Absolute 1.1 (H) 0.1 - 1.0 K/uL   Eosinophils Relative 1 %   Eosinophils Absolute 0.1 0.0 - 0.7 K/uL   Basophils Relative 0 %   Basophils Absolute 0.1 0.0 - 0.1 K/uL    Comment: Performed at [facility], [address] [place], [place] [address]  Protime-INR     Status: Abnormal   Collection Time: 08/18/17  9:45 PM  Result Value Ref Range   Prothrombin Time 16.7 (H) 11.4 - 15.2 seconds   INR 1.37     Comment: Performed at [facility], [address], [place], [place] [address]  Urinalysis, Routine w reflex microscopic     Status: Abnormal   Collection Time: 08/18/17  9:46 PM  Result Value Ref Range   Color, Urine AMBER (A) YELLOW    Comment: BIOCHEMICALS MAY BE AFFECTED BY COLOR   APPearance HAZY (A) CLEAR   Specific Gravity, Urine 1.025 1.005 - 1.030   pH 6.0 5.0 - 8.0   Glucose, UA NEGATIVE NEGATIVE mg/dL   Hgb urine dipstick MODERATE (A) NEGATIVE   Bilirubin Urine NEGATIVE NEGATIVE   Ketones, ur NEGATIVE NEGATIVE mg/dL   Protein, ur 30 (A) NEGATIVE mg/dL   Nitrite NEGATIVE NEGATIVE   Leukocytes, UA NEGATIVE NEGATIVE   RBC / HPF TOO NUMEROUS TO COUNT 0 - 5 RBC/hpf   WBC, UA 0-5 0 - 5 WBC/hpf   Bacteria, UA NONE SEEN NONE SEEN   Squamous Epithelial / LPF 0-5 (A) NONE SEEN   Mucus PRESENT    Hyaline Casts, UA PRESENT    Ca Oxalate Crys, UA PRESENT     Comment: Performed at [facility], [address], [place], [place] [address]  Culture, blood (Routine x 2)     Status: None (Preliminary result)   Collection Time: 08/18/17  9:51 PM  Result Value Ref Range   Specimen Description      BLOOD LEFT FOREARM Performed at [facility], [address], [place], [place] [address]     Special Requests      BOTTLES DRAWN AEROBIC ONLY Blood Culture adequate volume Performed at [facility], [address],  [place], [place] [address]    Culture  Setup Time      GRAM POSITIVE COCCI Gram Stain Report Called to,Read Back By and Verified With: HOWARD,C. AT 2138 ON 08/19/2017 BY EVA AEROBIC BOTTLE ONLY Performed at [facility] ID to follow    Culture      NO GROWTH 2 DAYS Performed at [facility], [address], [place], [place] [address]    Report Status PENDING   Blood Culture ID Panel (Reflexed)     Status: Abnormal   Collection Time: 08/18/17  9:51 PM  Result Value Ref Range   Enterococcus species NOT DETECTED NOT DETECTED   Listeria monocytogenes NOT DETECTED NOT DETECTED   Staphylococcus species DETECTED (A) NOT DETECTED    Comment: Methicillin (oxacillin) resistant coagulase negative staphylococcus. Possible blood culture contaminant (unless isolated from more than one blood culture draw or clinical case suggests pathogenicity). No antibiotic treatment is indicated for blood  culture contaminants. CRITICAL RESULT CALLED TO, READ BACK BY AND VERIFIED WITH: TO SGANNONS(RN) BY TCLEVELAND 08/20/17 AT 5:44AM    Staphylococcus aureus NOT DETECTED NOT DETECTED   Methicillin resistance DETECTED (A) NOT DETECTED    Comment: CRITICAL RESULT CALLED TO, READ BACK BY AND VERIFIED WITH: TO SGANNONS(RN) BY TCLEVELAND 08/20/17 AT 5:44AM    Streptococcus species NOT DETECTED NOT DETECTED   Streptococcus agalactiae NOT DETECTED NOT DETECTED   Streptococcus pneumoniae NOT DETECTED NOT DETECTED   Streptococcus pyogenes NOT DETECTED NOT DETECTED   Acinetobacter baumannii NOT DETECTED NOT DETECTED   Enterobacteriaceae species NOT DETECTED NOT DETECTED   Enterobacter cloacae complex NOT DETECTED NOT DETECTED   Escherichia coli NOT DETECTED NOT DETECTED   Klebsiella oxytoca NOT DETECTED NOT DETECTED   Klebsiella pneumoniae NOT DETECTED NOT DETECTED   Proteus species  NOT DETECTED NOT DETECTED   Serratia marcescens NOT DETECTED NOT DETECTED   Haemophilus influenzae NOT DETECTED NOT DETECTED   Neisseria meningitidis NOT DETECTED NOT DETECTED   Pseudomonas aeruginosa NOT DETECTED NOT DETECTED   Candida albicans NOT DETECTED NOT DETECTED   Candida glabrata NOT DETECTED NOT DETECTED   Candida krusei NOT DETECTED NOT DETECTED   Candida parapsilosis NOT DETECTED NOT DETECTED   Candida tropicalis NOT DETECTED NOT DETECTED    Comment: Performed at [facility], [address] [place], [place] [address]  I-Stat CG4 Lactic Acid, ED     Status: None   Collection Time: 08/18/17 10:01 PM  Result Value Ref Range   Lactic Acid, Venous 1.88 0.5 - 1.9 mmol/L  Blood gas, arterial     Status: Abnormal   Collection Time: 08/18/17 11:59 PM  Result Value Ref Range   FIO2 100.00    Delivery systems NON-REBREATHER OXYGEN MASK    pH, Arterial 7.512 (H) 7.350 - 7.450   pCO2 arterial 30.9 (L) 32.0 - 48.0 mmHg   pO2, Arterial 71.7 (L) 83.0 - 108.0 mmHg   Bicarbonate 26.3 20.0 - 28.0 mmol/L   Acid-Base Excess 1.7 0.0 - 2.0 mmol/L   O2 Saturation 94.8 %   Collection site RADIAL    Drawn by [id number]    Sample type ARTERIAL    Allens test (pass/fail) PASS PASS    Comment: Performed at [facility], [address], [place], [place] [address]  I-Stat CG4 Lactic Acid, ED     Status: None   Collection Time: 08/19/17 12:12 AM  Result Value Ref Range   Lactic Acid, Venous 1.40 0.5 - 1.9 mmol/L  MRSA PCR Screening  Status: None   Collection Time: 08/19/17  1:09 AM  Result Value Ref Range   MRSA by PCR NEGATIVE NEGATIVE    Comment:        The GeneXpert MRSA Assay (FDA approved for NASAL specimens only), is one component of a comprehensive MRSA colonization surveillance program. It is not intended to diagnose MRSA infection nor to guide or monitor treatment for MRSA infections. Performed at [facility], [address], [place], [place] [address]   Respiratory  Panel by PCR     Status: None   Collection Time: 08/19/17  1:55 AM  Result Value Ref Range   Adenovirus NOT DETECTED NOT DETECTED   Coronavirus 229E NOT DETECTED NOT DETECTED   Coronavirus HKU1 NOT DETECTED NOT DETECTED   Coronavirus NL63 NOT DETECTED NOT DETECTED   Coronavirus OC43 NOT DETECTED NOT DETECTED   Metapneumovirus NOT DETECTED NOT DETECTED   Rhinovirus / Enterovirus NOT DETECTED NOT DETECTED   Influenza A NOT DETECTED NOT DETECTED   Influenza B NOT DETECTED NOT DETECTED   Parainfluenza Virus 1 NOT DETECTED NOT DETECTED   Parainfluenza Virus 2 NOT DETECTED NOT DETECTED   Parainfluenza Virus 3 NOT DETECTED NOT DETECTED   Parainfluenza Virus 4 NOT DETECTED NOT DETECTED   Respiratory Syncytial Virus NOT DETECTED NOT DETECTED   Bordetella pertussis NOT DETECTED NOT DETECTED   Chlamydophila pneumoniae NOT DETECTED NOT DETECTED   Mycoplasma pneumoniae NOT DETECTED NOT DETECTED    Comment: Performed at [place] [address], [place], [place] [address]  Culture, respiratory (NON-Expectorated)     Status: None (Preliminary result)   Collection Time: 08/19/17  2:40 AM  Result Value Ref Range   Specimen Description      TRACHEAL ASPIRATE Performed at [facility], [address] [place], [place] [address]    Special Requests      NONE Performed at [facility], [address] [place], [place] [address]    Gram Stain      ABUNDANT WBC PRESENT, PREDOMINANTLY PMN RARE SQUAMOUS EPITHELIAL CELLS PRESENT ABUNDANT GRAM POSITIVE COCCI IN PAIRS IN CLUSTERS FEW GRAM POSITIVE RODS FEW GRAM NEGATIVE RODS Performed at [facility], [place] [address] [place], [place] [address]    Culture PENDING    Report Status PENDING   Blood gas, arterial     Status: Abnormal   Collection Time: 08/19/17  3:52 AM  Result Value Ref Range   FIO2 98.00    Delivery systems TRACH COLLAR/TRACH TUBE    pH, Arterial 7.476 (H) 7.350 - 7.450   pCO2 arterial 36.5 32.0 - 48.0 mmHg   pO2,  Arterial 86.4 83.0 - 108.0 mmHg   Bicarbonate 26.9 20.0 - 28.0 mmol/L   Acid-Base Excess 2.6 (H) 0.0 - 2.0 mmol/L   O2 Saturation 96.3 %   Collection site LEFT RADIAL    Drawn by [id number]    Sample type ARTERIAL    Allens test (pass/fail) PASS PASS    Comment: Performed at [facility], [address], [place], [place] [address]  Basic metabolic panel     Status: Abnormal   Collection Time: 08/19/17  4:50 AM  Result Value Ref Range   Sodium 146 (H) 135 - 145 mmol/L   Potassium 3.6 3.5 - 5.1 mmol/L   Chloride 112 (H) 101 - 111 mmol/L   CO2 24 22 - 32 mmol/L   Glucose, Bld 129 (H) 65 - 99 mg/dL   BUN 33 (H) 6 - 20 mg/dL  Creatinine, Ser 0.39 (L) 0.61 - 1.24 mg/dL   Calcium 8.9 8.9 - 10.3 mg/dL   GFR calc non Af Amer >60 >60 mL/min   GFR calc Af Amer >60 >60 mL/min    Comment: (NOTE) The eGFR has been calculated using the CKD EPI equation. This calculation has not been validated in all clinical situations. eGFR's persistently <60 mL/min signify possible Chronic Kidney Disease.    Anion gap 10 5 - 15    Comment: Performed at [facility], [address], [place], [place] [address]  CBC     Status: Abnormal   Collection Time: 08/19/17  4:50 AM  Result Value Ref Range   WBC 21.9 (H) 4.0 - 10.5 K/uL   RBC 4.16 (L) 4.22 - 5.81 MIL/uL   Hemoglobin 10.7 (L) 13.0 - 17.0 g/dL   HCT 36.7 (L) 39.0 - 52.0 %   MCV 88.2 78.0 - 100.0 fL   MCH 25.7 (L) 26.0 - 34.0 pg   MCHC 29.2 (L) 30.0 - 36.0 g/dL   RDW 16.3 (H) 11.5 - 15.5 %   Platelets 298 150 - 400 K/uL    Comment: Performed at [facility], [address], [place], [place] [address]  Lactic acid, plasma     Status: None   Collection Time: 08/19/17  4:50 AM  Result Value Ref Range   Lactic Acid, Venous 1.0 0.5 - 1.9 mmol/L    Comment: Performed at [facility], [address] [place], [place] [address]  Procalcitonin - Baseline     Status: None   Collection Time: 08/19/17  4:50 AM  Result Value Ref Range   Procalcitonin 0.22 ng/mL     Comment:        Interpretation: PCT (Procalcitonin) <= 0.5 ng/mL: Systemic infection (sepsis) is not likely. Local bacterial infection is possible. (NOTE)       Sepsis PCT Algorithm           Lower Respiratory Tract                                      Infection PCT Algorithm    ----------------------------     ----------------------------         PCT < 0.25 ng/mL                PCT < 0.10 ng/mL         Strongly encourage             Strongly discourage   discontinuation of antibiotics    initiation of antibiotics    ----------------------------     -----------------------------       PCT 0.25 - 0.50 ng/mL            PCT 0.10 - 0.25 ng/mL               OR       >80% decrease in PCT            Discourage initiation of                                            antibiotics      Encourage discontinuation           of antibiotics    ----------------------------     -----------------------------  PCT >= 0.50 ng/mL              PCT 0.26 - 0.50 ng/mL               AND        <80% decrease in PCT             Encourage initiation of                                             antibiotics       Encourage continuation           of antibiotics    ----------------------------     -----------------------------        PCT >= 0.50 ng/mL                  PCT > 0.50 ng/mL               AND         increase in PCT                  Strongly encourage                                      initiation of antibiotics    Strongly encourage escalation           of antibiotics                                     -----------------------------                                           PCT <= 0.25 ng/mL                                                 OR                                        > 80% decrease in PCT                                     Discontinue / Do not initiate                                             antibiotics Performed at [facility], [address] [place], [place]  [address]   Triglycerides     Status: None   Collection Time: 08/19/17  4:50 AM  Result Value Ref Range   Triglycerides 115 <150 mg/dL    Comment: Performed at [facility], [address] [place], [place] [address]  Influenza panel by PCR (type A & B)  Status: None   Collection Time: 08/19/17  6:12 AM  Result Value Ref Range   Influenza A By PCR NEGATIVE NEGATIVE   Influenza B By PCR NEGATIVE NEGATIVE    Comment: (NOTE) The Xpert Xpress Flu assay is intended as an aid in the diagnosis of  influenza and should not be used as a sole basis for treatment.  This  assay is FDA approved for nasopharyngeal swab specimens only. Nasal  washings and aspirates are unacceptable for Xpert Xpress Flu testing. Performed at [facility], [address] [place], [place] [address]   Glucose, capillary     Status: Abnormal   Collection Time: 08/19/17  7:36 AM  Result Value Ref Range   Glucose-Capillary 135 (H) 65 - 99 mg/dL  Draw ABG 1 hour after initiation of ventilator     Status: Abnormal   Collection Time: 08/19/17 11:52 AM  Result Value Ref Range   FIO2 1.00    Delivery systems VENTILATOR    Mode PRESSURE REGULATED VOLUME CONTROL    VT 620 mL   LHR 20 resp/min   Peep/cpap 5.0 cm H20   pH, Arterial 7.439 7.350 - 7.450   pCO2 arterial 38.4 32.0 - 48.0 mmHg   pO2, Arterial 152 (H) 83.0 - 108.0 mmHg   Bicarbonate 26.1 20.0 - 28.0 mmol/L   Acid-Base Excess 1.8 0.0 - 2.0 mmol/L   O2 Saturation 98.9 %   Patient temperature 37.0    Collection site LEFT RADIAL    Drawn by [id number]    Sample type ARTERIAL    Allens test (pass/fail) PASS PASS    Comment: Performed at [facility], [address] [place], [place] [address]  Procalcitonin     Status: None   Collection Time: 08/20/17  4:05 AM  Result Value Ref Range   Procalcitonin 0.44 ng/mL    Comment:        Interpretation: PCT (Procalcitonin) <= 0.5 ng/mL: Systemic infection (sepsis) is not likely. Local bacterial infection is  possible. (NOTE)       Sepsis PCT Algorithm           Lower Respiratory Tract                                      Infection PCT Algorithm    ----------------------------     ----------------------------         PCT < 0.25 ng/mL                PCT < 0.10 ng/mL         Strongly encourage             Strongly discourage   discontinuation of antibiotics    initiation of antibiotics    ----------------------------     -----------------------------       PCT 0.25 - 0.50 ng/mL            PCT 0.10 - 0.25 ng/mL               OR       >80% decrease in PCT            Discourage initiation of  antibiotics      Encourage discontinuation           of antibiotics    ----------------------------     -----------------------------         PCT >= 0.50 ng/mL              PCT 0.26 - 0.50 ng/mL               AND        <80% decrease in PCT             Encourage initiation of                                             antibiotics       Encourage continuation           of antibiotics    ----------------------------     -----------------------------        PCT >= 0.50 ng/mL                  PCT > 0.50 ng/mL               AND         increase in PCT                  Strongly encourage                                      initiation of antibiotics    Strongly encourage escalation           of antibiotics                                     -----------------------------                                           PCT <= 0.25 ng/mL                                                 OR                                        > 80% decrease in PCT                                     Discontinue / Do not initiate                                             antibiotics Performed at [facility], [address], [place], [place] [address]   CBC     Status: Abnormal   Collection Time: 08/20/17  4:05 AM  Result Value Ref Range   WBC 20.9 (H) 4.0 - 10.5 K/uL   RBC 3.76 (L)  4.22 - 5.81 MIL/uL   Hemoglobin 9.8 (L) 13.0 - 17.0 g/dL   HCT 33.5 (L) 39.0 - 52.0 %   MCV 89.1 78.0 - 100.0 fL   MCH 26.1 26.0 - 34.0 pg   MCHC 29.3 (L) 30.0 - 36.0 g/dL   RDW 16.6 (H) 11.5 - 15.5 %   Platelets 263 150 - 400 K/uL    Comment: Performed at [facility], [address], [place], [place] [address]  Comprehensive metabolic panel     Status: Abnormal   Collection Time: 08/20/17  4:05 AM  Result Value Ref Range   Sodium 144 135 - 145 mmol/L   Potassium 3.7 3.5 - 5.1 mmol/L   Chloride 108 101 - 111 mmol/L   CO2 23 22 - 32 mmol/L   Glucose, Bld 93 65 - 99 mg/dL   BUN 34 (H) 6 - 20 mg/dL   Creatinine, Ser 0.41 (L) 0.61 - 1.24 mg/dL   Calcium 8.7 (L) 8.9 - 10.3 mg/dL   Total Protein 6.1 (L) 6.5 - 8.1 g/dL   Albumin 1.9 (L) 3.5 - 5.0 g/dL   AST 24 15 - 41 U/L   ALT 50 17 - 63 U/L   Alkaline Phosphatase 260 (H) 38 - 126 U/L   Total Bilirubin 1.2 0.3 - 1.2 mg/dL   GFR calc non Af Amer >60 >60 mL/min   GFR calc Af Amer >60 >60 mL/min    Comment: (NOTE) The eGFR has been calculated using the CKD EPI equation. This calculation has not been validated in all clinical situations. eGFR's persistently <60 mL/min signify possible Chronic Kidney Disease.    Anion gap 13 5 - 15    Comment: Performed at [facility], [address], [place], [place] [address]  Magnesium     Status: None   Collection Time: 08/20/17  4:05 AM  Result Value Ref Range   Magnesium 1.9 1.7 - 2.4 mg/dL    Comment: Performed at [facility], [address], [place], [place] [address]  Phosphorus     Status: None   Collection Time: 08/20/17  4:05 AM  Result Value Ref Range   Phosphorus 3.2 2.5 - 4.6 mg/dL    Comment: Performed at [facility], [address], [place], [place] [address]  Brain natriuretic peptide     Status: None   Collection Time: 08/20/17  4:05 AM  Result Value Ref Range   B Natriuretic Peptide 66.0 0.0 - 100.0 pg/mL    Comment: Performed at [facility], [address]  [place], [place] [address]  Blood gas, arterial     Status: Abnormal   Collection Time: 08/20/17  4:30 AM  Result Value Ref Range   FIO2 70.00    Delivery systems VENTILATOR    Mode PRESSURE REGULATED VOLUME CONTROL    VT 620 mL   LHR 20 resp/min   Peep/cpap 5.0 cm H20   pH, Arterial 7.487 (H) 7.350 - 7.450   pCO2 arterial 34.0 32.0 - 48.0 mmHg   pO2, Arterial 120 (H) 83.0 - 108.0 mmHg   Bicarbonate 26.8 20.0 - 28.0 mmol/L   Acid-Base Excess 2.3 (H) 0.0 - 2.0 mmol/L   O2 Saturation 98.4 %   Collection site LEFT RADIAL    Drawn by [id number]    Sample type ARTERIAL    Allens test (pass/fail) PASS PASS    Comment: Performed at  [facility], [address], [place], [place] [address]    ABGS Recent Labs    08/20/17 0430  PHART 7.487*  PO2ART 120*  HCO3 26.8   CULTURES Recent Results (from the past 240 hour(s))  Culture, blood (Routine x 2)     Status: None (Preliminary result)   Collection Time: 08/18/17  9:42 PM  Result Value Ref Range Status   Specimen Description   Final    LEFT ANTECUBITAL Performed at [facility], [address] [place], [place] [address]    Special Requests   Final    BOTTLES DRAWN AEROBIC AND ANAEROBIC Blood Culture adequate volume Performed at [facility], [address] [place], [place] [address]    Culture  Setup Time   Final    GRAM POSITIVE COCCI Gram Stain Report Called to,Read Back By and Verified With: GAMMONS,S. AT 0203 ON 08/20/2017 BY EVA Performed at [facility] ID to follow    Culture   Final    NO GROWTH 2 DAYS Performed at [facility], [address] [place], [place] [address]    Report Status PENDING  Incomplete  Culture, blood (Routine x 2)     Status: None (Preliminary result)   Collection Time: 08/18/17  9:51 PM  Result Value Ref Range Status   Specimen Description   Final    BLOOD LEFT FOREARM Performed at [facility], [address] [place], [place] [address]    Special Requests   Final    BOTTLES DRAWN  AEROBIC ONLY Blood Culture adequate volume Performed at [facility], [address], [place], [place] [address]    Culture  Setup Time   Final    GRAM POSITIVE COCCI Gram Stain Report Called to,Read Back By and Verified With: HOWARD,C. AT 2138 ON 08/19/2017 BY EVA AEROBIC BOTTLE ONLY Performed at [facility] ID to follow    Culture   Final    NO GROWTH 2 DAYS Performed at [facility], [address], [place], [place] [address]    Report Status PENDING  Incomplete  Blood Culture ID Panel (Reflexed)     Status: Abnormal   Collection Time: 08/18/17  9:51 PM  Result Value Ref Range Status   Enterococcus species NOT DETECTED NOT DETECTED Final   Listeria monocytogenes NOT DETECTED NOT DETECTED Final   Staphylococcus species DETECTED (A) NOT DETECTED Final    Comment: Methicillin (oxacillin) resistant coagulase negative staphylococcus. Possible blood culture contaminant (unless isolated from more than one blood culture draw or clinical case suggests pathogenicity). No antibiotic treatment is indicated for blood  culture contaminants. CRITICAL RESULT CALLED TO, READ BACK BY AND VERIFIED WITH: TO SGANNONS(RN) BY TCLEVELAND 08/20/17 AT 5:44AM    Staphylococcus aureus NOT DETECTED NOT DETECTED Final   Methicillin resistance DETECTED (A) NOT DETECTED Final    Comment: CRITICAL RESULT CALLED TO, READ BACK BY AND VERIFIED WITH: TO SGANNONS(RN) BY TCLEVELAND 08/20/17 AT 5:44AM    Streptococcus species NOT DETECTED NOT DETECTED Final   Streptococcus agalactiae NOT DETECTED NOT DETECTED Final   Streptococcus pneumoniae NOT DETECTED NOT DETECTED Final   Streptococcus pyogenes NOT DETECTED NOT DETECTED Final   Acinetobacter baumannii NOT DETECTED NOT DETECTED Final   Enterobacteriaceae species NOT DETECTED NOT DETECTED Final   Enterobacter cloacae complex NOT DETECTED NOT DETECTED Final   Escherichia coli NOT DETECTED NOT DETECTED Final   Klebsiella oxytoca NOT DETECTED NOT DETECTED  Final   Klebsiella pneumoniae NOT DETECTED NOT DETECTED Final  Proteus species NOT DETECTED NOT DETECTED Final   Serratia marcescens NOT DETECTED NOT DETECTED Final   Haemophilus influenzae NOT DETECTED NOT DETECTED Final   Neisseria meningitidis NOT DETECTED NOT DETECTED Final   Pseudomonas aeruginosa NOT DETECTED NOT DETECTED Final   Candida albicans NOT DETECTED NOT DETECTED Final   Candida glabrata NOT DETECTED NOT DETECTED Final   Candida krusei NOT DETECTED NOT DETECTED Final   Candida parapsilosis NOT DETECTED NOT DETECTED Final   Candida tropicalis NOT DETECTED NOT DETECTED Final    Comment: Performed at [facility], [place] [address], [place], [place] [address]  MRSA PCR Screening     Status: None   Collection Time: 08/19/17  1:09 AM  Result Value Ref Range Status   MRSA by PCR NEGATIVE NEGATIVE Final    Comment:        The GeneXpert MRSA Assay (FDA approved for NASAL specimens only), is one component of a comprehensive MRSA colonization surveillance program. It is not intended to diagnose MRSA infection nor to guide or monitor treatment for MRSA infections. Performed at [facility], [address], [place], [place] [address]   Respiratory Panel by PCR     Status: None   Collection Time: 08/19/17  1:55 AM  Result Value Ref Range Status   Adenovirus NOT DETECTED NOT DETECTED Final   Coronavirus 229E NOT DETECTED NOT DETECTED Final   Coronavirus HKU1 NOT DETECTED NOT DETECTED Final   Coronavirus NL63 NOT DETECTED NOT DETECTED Final   Coronavirus OC43 NOT DETECTED NOT DETECTED Final   Metapneumovirus NOT DETECTED NOT DETECTED Final   Rhinovirus / Enterovirus NOT DETECTED NOT DETECTED Final   Influenza A NOT DETECTED NOT DETECTED Final   Influenza B NOT DETECTED NOT DETECTED Final   Parainfluenza Virus 1 NOT DETECTED NOT DETECTED Final   Parainfluenza Virus 2 NOT DETECTED NOT DETECTED Final   Parainfluenza Virus 3 NOT DETECTED NOT DETECTED Final   Parainfluenza  Virus 4 NOT DETECTED NOT DETECTED Final   Respiratory Syncytial Virus NOT DETECTED NOT DETECTED Final   Bordetella pertussis NOT DETECTED NOT DETECTED Final   Chlamydophila pneumoniae NOT DETECTED NOT DETECTED Final   Mycoplasma pneumoniae NOT DETECTED NOT DETECTED Final    Comment: Performed at [facility], [place] [address], [place], [place] [address]  Culture, respiratory (NON-Expectorated)     Status: None (Preliminary result)   Collection Time: 08/19/17  2:40 AM  Result Value Ref Range Status   Specimen Description   Final    TRACHEAL ASPIRATE Performed at [facility], [address] [place], [place] [address]    Special Requests   Final    NONE Performed at [facility], [address], [place], [place] [address]    Gram Stain   Final    ABUNDANT WBC PRESENT, PREDOMINANTLY PMN RARE SQUAMOUS EPITHELIAL CELLS PRESENT ABUNDANT GRAM POSITIVE COCCI IN PAIRS IN CLUSTERS FEW GRAM POSITIVE RODS FEW GRAM NEGATIVE RODS Performed at [facility], [place] [address], [place], [place] [address]    Culture PENDING  Incomplete   Report Status PENDING  Incomplete   Studies/Results: Dg Chest Port 1 View  Result Date: 08/20/2017 CLINICAL DATA:  Acute respiratory failure EXAM: PORTABLE CHEST 1 VIEW COMPARISON:  08/19/2017 FINDINGS: Cardiac shadow is stable. Aortic calcifications are again seen. Tracheostomy tube is again seen and stable. Lungs are well aerated with the exception of persistent left retrocardiac density. The overall appearance however has improved when compare with the prior exam.  IMPRESSION: Persistent but significantly improved left retrocardiac density. Electronically Signed   By: Inez Catalina M.D.   On: 08/20/2017 07:09   Dg Chest Port 1 View  Result Date: 08/19/2017 CLINICAL DATA:  Follow-up healthcare associated pneumonia, sepsis EXAM: PORTABLE CHEST 1 VIEW COMPARISON:  08/18/2017 FINDINGS: Moderate left pleural effusion, mildly increased. Associated left lower lobe  opacity, favoring compressive atelectasis, pneumonia not excluded. Mild left suprahilar opacity, new, favoring asymmetric interstitial edema given the rapid change. Right lung is essentially clear. No pneumothorax. Tracheostomy in satisfactory position. The heart is top-normal in size. IMPRESSION: Mild left suprahilar opacity, new, favoring asymmetric interstitial edema given the rapid change. Moderate left pleural effusion, increased. Associated left lower lobe opacity, favoring compressive atelectasis, pneumonia not excluded. Electronically Signed   By: Julian Hy M.D.   On: 08/19/2017 07:14   Dg Chest Portable 1 View  Result Date: 08/18/2017 CLINICAL DATA:  Chronic ventilator dependent respiratory failure, presenting with fever, tachycardia, tachypnea and hypoxia. Personal history of stroke. EXAM: PORTABLE CHEST 1 VIEW COMPARISON:  07/22/2017, 11/23/2016 and earlier. FINDINGS: Tracheostomy tube tip in satisfactory position below the thoracic inlet. Cardiac silhouette upper normal in size for AP portable technique, unchanged. Dense airspace consolidation involving the left lower lobe. Lungs otherwise clear. 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 IMPRESSION: 1. Dense left lower lobe pneumonia and/or atelectasis. 2. 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 Electronically Signed   By: Evangeline Dakin M.D.   On: 08/18/2017 22:15   US Abdomen Limited Ruq  Result Date: 08/19/2017 CLINICAL DATA:  Elevated LFTs EXAM: ULTRASOUND ABDOMEN LIMITED RIGHT UPPER QUADRANT COMPARISON:  CT abdomen/pelvis dated 08/02/2017 FINDINGS: Gallbladder: Layering gallbladder sludge. No gallbladder wall thickening or pericholecystic fluid. Common bile duct: Diameter: 3 mm Liver: No focal lesion identified. Within normal limits in parenchymal echogenicity. Portal vein is patent on color Doppler imaging with normal direction of blood flow towards the liver. IMPRESSION: Layering gallbladder sludge,  without associated inflammatory changes. Electronically Signed   By: Julian Hy M.D.   On: 08/19/2017 11:27    Medications:  Prior to Admission:  Medications Prior to Admission  Medication Sig Dispense Refill Last Dose  . allopurinol (ZYLOPRIM) 100 MG tablet Place 100 mg into feeding tube 3 (three) times a week. Every Tuesday, Thursday, Sunday   08/16/2017 at Unknown time  . amantadine (SYMMETREL) 100 MG capsule Place 100 mg into feeding tube 2 (two) times daily.   08/18/2017 at Unknown time  . apixaban (ELIQUIS) 2.5 MG TABS tablet Place 2.5 mg into feeding tube 2 (two) times daily.   08/18/2017 at 1700  . collagenase (SANTYL) ointment Apply 1 application topically daily. Right buttocks area   08/18/2017 at Unknown time  . famotidine (PEPCID) 20 MG tablet Place 20 mg into feeding tube 2 (two) times daily.   08/18/2017 at Unknown time  . HYDROcodone-acetaminophen (NORCO/VICODIN) 5-325 MG tablet Place 1 tablet into feeding tube every 6 (six) hours as needed for moderate pain.   unknown  . Melatonin 3 MG TABS Place 3 mg into feeding tube at bedtime.   08/18/2017 at Unknown time  . Metoprolol Tartrate 37.5 MG TABS Place 1 tablet into feeding tube daily.   08/18/2017 at 800a  . modafinil (PROVIGIL) 100 MG tablet Place 100 mg into feeding tube daily.   08/18/2017 at Unknown time  . Omega-3 Fatty Acids (FISH OIL) 1000 MG CAPS Place 1 capsule into feeding tube daily.   08/18/2017 at Unknown time  . polyethylene glycol powder (GLYCOLAX/MIRALAX)  powder Take 17 g by mouth daily.   08/18/2017 at Unknown time  . sertraline (ZOLOFT) 50 MG tablet Place 50 mg into feeding tube daily.   08/18/2017 at Unknown time  . tamsulosin (FLOMAX) 0.4 MG CAPS capsule 0.4 mg daily. Per G-Tube   08/18/2017 at Unknown time   Scheduled: . allopurinol  100 mg Per Tube Daily  . amantadine  100 mg Per Tube BID  . apixaban  2.5 mg Per Tube BID  . chlorhexidine gluconate (MEDLINE KIT)  15 mL Mouth Rinse BID  . collagenase  1 application  Topical Daily  . famotidine  20 mg Per Tube BID  . ipratropium-albuterol  3 mL Nebulization Q6H  . mouth rinse  15 mL Mouth Rinse QID  . metoprolol tartrate  2.5 mg Intravenous Q6H  . omega-3 acid ethyl esters  1 g Per Tube Daily  . polyethylene glycol  17 g Oral Daily  . sertraline  50 mg Per Tube Daily  . tamsulosin  0.4 mg Oral Daily   Continuous: . 0.45 % NaCl with KCl 20 mEq / L 75 mL/hr at 08/19/17 2339  . meropenem (MERREM) IV Stopped (08/20/17 3428)  . propofol (DIPRIVAN) infusion 30 mcg/kg/min (08/20/17 0453)   JGO:[id number] **OR** acetaminophen, midazolam, ondansetron **OR** ondansetron (ZOFRAN) IV  Assesment: He was admitted with sepsis presumably from aspiration pneumonia.  He has acute on chronic hypoxic respiratory failure.  He seems to be better with sepsis his blood pressure is better and heart rate has improved  His respiratory failure is better with ventilator support and his respiratory rate is now at 20 which is the ventilator rate and his oxygenation is much better.  He looks much more comfortable.  He is still on 65% oxygen.  I think he very likely has aspirated and he is being treated appropriately for that.  Chest x-ray this morning looks better although he still has an infiltrate in the left lower lobe  He has a previous stroke and a previous subdural hematoma  He had recent C6 fusion and C1-C2 and C3 laminectomy.  He had right axillary vein DVT and he is on anticoagulation for that Principal Problem:   Sepsis ([place]) Active Problems:   Hyperlipidemia   Essential hypertension   CAD, NATIVE VESSEL   Stroke (cerebrum) (HCC)   Pneumonia   Acute hypoxemic respiratory failure (HCC)   Acute on chronic respiratory failure with hypoxia (HCC)   Sepsis due to undetermined organism ([place])   Aspiration pneumonia of both lower lobes due to gastric secretions (HCC)   Tracheostomy status (HCC)   Deep vein thrombosis (DVT) of right upper extremity (HCC)    Transaminasemia   Lobar pneumonia ([place])    Plan: I think he is a little better.  He remains critically ill. I would not plan to attempt weaning today although with a tracheostomy it would be easy to put him back on the ventilator if we needed to.  I think he needs another 24 hours or so.   LOS: 2 days   Allean Montfort L 08/20/2017, 7:43 AM

## 2017-08-20 NOTE — Progress Notes (Signed)
ANTIBIOTIC CONSULT NOTE-Preliminary  Pharmacy Consult for Vanc/meropenem Indication: HCAP/Sepsis  Allergies  Allergen Reactions  . Codeine Nausea Only and Other (See Comments)  . Crestor [Rosuvastatin Calcium] Other (See Comments)    bodyache  . Lipitor [Atorvastatin] Other (See Comments)    Body aches  . Statins   . Allopurinol Rash    Patient Measurements: Height: 6' (182.9 cm) Weight: 165 lb 2 oz (74.9 kg) IBW/kg (Calculated) : 77.6   Vital Signs: Temp: 98 F (36.7 C) (02/04 0755) Temp Source: Axillary (02/04 0755) BP: 102/58 (02/04 0700) Pulse Rate: 104 (02/04 0700)  Labs: Recent Labs    08/18/17 2145 08/19/17 0450 08/20/17 0405  WBC 19.4* 21.9* 20.9*  HGB 11.8* 10.7* 9.8*  PLT 348 298 263  CREATININE 0.54* 0.39* 0.41*    Estimated Creatinine Clearance: 81.9 mL/min (A) (by C-G formula based on SCr of 0.41 mg/dL (L)).  No results for input(s): VANCOTROUGH, VANCOPEAK, VANCORANDOM, GENTTROUGH, GENTPEAK, GENTRANDOM, TOBRATROUGH, TOBRAPEAK, TOBRARND, AMIKACINPEAK, AMIKACINTROU, AMIKACIN in the last 72 hours.   Microbiology: Recent Results (from the past 720 hour(s))  Culture, Urine     Status: None   Collection Time: 07/27/17  2:30 PM  Result Value Ref Range Status   Specimen Description URINE, RANDOM  Final   Special Requests NONE  Final   Culture NO GROWTH  Final   Report Status 07/28/2017 FINAL  Final  Culture, respiratory (NON-Expectorated)     Status: None   Collection Time: 07/28/17  9:25 AM  Result Value Ref Range Status   Specimen Description TRACHEAL ASPIRATE  Final   Special Requests NONE  Final   Gram Stain   Final    RARE WBC PRESENT, PREDOMINANTLY MONONUCLEAR NO SQUAMOUS EPITHELIAL CELLS SEEN RARE GRAM POSITIVE COCCI IN PAIRS    Culture FEW PSEUDOMONAS FLUORESCENS  Final   Report Status 08/01/2017 FINAL  Final   Organism ID, Bacteria PSEUDOMONAS FLUORESCENS  Final      Susceptibility   Pseudomonas fluorescens - MIC*    CEFTAZIDIME 4  SENSITIVE Sensitive     CIPROFLOXACIN <=0.25 SENSITIVE Sensitive     GENTAMICIN <=1 SENSITIVE Sensitive     IMIPENEM <=0.25 SENSITIVE Sensitive     PIP/TAZO <=4 SENSITIVE Sensitive     CEFEPIME 16 INTERMEDIATE Intermediate     * FEW PSEUDOMONAS FLUORESCENS  Culture, blood (routine x 2)     Status: None   Collection Time: 07/31/17  2:19 PM  Result Value Ref Range Status   Specimen Description BLOOD LEFT HAND  Final   Special Requests   Final    BOTTLES DRAWN AEROBIC AND ANAEROBIC Blood Culture adequate volume   Culture NO GROWTH 5 DAYS  Final   Report Status 08/05/2017 FINAL  Final  Culture, blood (routine x 2)     Status: None   Collection Time: 07/31/17  2:19 PM  Result Value Ref Range Status   Specimen Description BLOOD WRIST LEFT  Final   Special Requests   Final    BOTTLES DRAWN AEROBIC AND ANAEROBIC Blood Culture adequate volume   Culture NO GROWTH 5 DAYS  Final   Report Status 08/05/2017 FINAL  Final  Culture, Urine     Status: None   Collection Time: 08/09/17  8:24 AM  Result Value Ref Range Status   Specimen Description URINE, CLEAN CATCH  Final   Special Requests Normal  Final   Culture NO GROWTH  Final   Report Status 08/10/2017 FINAL  Final  Culture, respiratory (NON-Expectorated)  Status: None   Collection Time: 08/09/17  8:24 AM  Result Value Ref Range Status   Specimen Description TRACHEAL ASPIRATE  Final   Special Requests Normal  Final   Gram Stain   Final    RARE WBC PRESENT, PREDOMINANTLY PMN MODERATE GRAM NEGATIVE RODS RARE GRAM POSITIVE COCCI IN PAIRS IN CLUSTERS RARE GRAM POSITIVE RODS    Culture MODERATE ENTEROBACTER AEROGENES  Final   Report Status 08/11/2017 FINAL  Final   Organism ID, Bacteria ENTEROBACTER AEROGENES  Final      Susceptibility   Enterobacter aerogenes - MIC*    CEFAZOLIN >=64 RESISTANT Resistant     CEFEPIME <=1 SENSITIVE Sensitive     CEFTAZIDIME <=1 SENSITIVE Sensitive     CEFTRIAXONE <=1 SENSITIVE Sensitive      CIPROFLOXACIN <=0.25 SENSITIVE Sensitive     GENTAMICIN <=1 SENSITIVE Sensitive     IMIPENEM 0.5 SENSITIVE Sensitive     TRIMETH/SULFA <=20 SENSITIVE Sensitive     PIP/TAZO <=4 SENSITIVE Sensitive     * MODERATE ENTEROBACTER AEROGENES  Culture, blood (routine x 2)     Status: None   Collection Time: 08/09/17 10:05 AM  Result Value Ref Range Status   Specimen Description BLOOD LEFT ARM  Final   Special Requests   Final    BOTTLES DRAWN AEROBIC AND ANAEROBIC Blood Culture adequate volume   Culture NO GROWTH 5 DAYS  Final   Report Status 08/14/2017 FINAL  Final  Culture, blood (routine x 2)     Status: None   Collection Time: 08/09/17 10:21 AM  Result Value Ref Range Status   Specimen Description BLOOD LEFT ARM  Final   Special Requests   Final    BOTTLES DRAWN AEROBIC AND ANAEROBIC Blood Culture adequate volume   Culture NO GROWTH 5 DAYS  Final   Report Status 08/14/2017 FINAL  Final  Culture, blood (Routine x 2)     Status: None (Preliminary result)   Collection Time: 08/18/17  9:42 PM  Result Value Ref Range Status   Specimen Description   Final    LEFT ANTECUBITAL Performed at [facility], [address], [place], [place] [address]    Special Requests   Final    BOTTLES DRAWN AEROBIC AND ANAEROBIC Blood Culture adequate volume Performed at [facility], [address] [place], [place] [address]    Culture  Setup Time   Final    GRAM POSITIVE COCCI Gram Stain Report Called to,Read Back By and Verified With: GAMMONS,S. AT 0203 ON 08/20/2017 BY EVA Performed at [facility] Organism ID to follow    Culture   Final    NO GROWTH 2 DAYS Performed at [facility], [address] [place], [place] [address]    Report Status PENDING  Incomplete  Blood Culture ID Panel (Reflexed)     Status: Abnormal   Collection Time: 08/18/17  9:42 PM  Result Value Ref Range Status   Enterococcus species NOT DETECTED NOT DETECTED Final   Listeria monocytogenes NOT DETECTED NOT DETECTED  Final   Staphylococcus species DETECTED (A) NOT DETECTED Final    Comment: Methicillin (oxacillin) resistant coagulase negative staphylococcus. Possible blood culture contaminant (unless isolated from more than one blood culture draw or clinical case suggests pathogenicity). No antibiotic treatment is indicated for blood  culture contaminants. L. Raylin Winer Pharm.D. 7:45 08/20/17 ([email or web address])    Staphylococcus aureus NOT DETECTED NOT DETECTED Final   Methicillin resistance DETECTED (A) NOT DETECTED Final    Comment: CRITICAL RESULT  CALLED TO, READ BACK BY AND VERIFIED WITH: L. Reese Stockman Pharm.D. 7:45 08/20/17 ([email or web address])    Streptococcus species NOT DETECTED NOT DETECTED Final   Streptococcus agalactiae NOT DETECTED NOT DETECTED Final   Streptococcus pneumoniae NOT DETECTED NOT DETECTED Final   Streptococcus pyogenes NOT DETECTED NOT DETECTED Final   Acinetobacter baumannii NOT DETECTED NOT DETECTED Final   Enterobacteriaceae species NOT DETECTED NOT DETECTED Final   Enterobacter cloacae complex NOT DETECTED NOT DETECTED Final   Escherichia coli NOT DETECTED NOT DETECTED Final   Klebsiella oxytoca NOT DETECTED NOT DETECTED Final   Klebsiella pneumoniae NOT DETECTED NOT DETECTED Final   Proteus species NOT DETECTED NOT DETECTED Final   Serratia marcescens NOT DETECTED NOT DETECTED Final   Haemophilus influenzae NOT DETECTED NOT DETECTED Final   Neisseria meningitidis NOT DETECTED NOT DETECTED Final   Pseudomonas aeruginosa NOT DETECTED NOT DETECTED Final   Candida albicans NOT DETECTED NOT DETECTED Final   Candida glabrata NOT DETECTED NOT DETECTED Final   Candida krusei NOT DETECTED NOT DETECTED Final   Candida parapsilosis NOT DETECTED NOT DETECTED Final   Candida tropicalis NOT DETECTED NOT DETECTED Final    Comment: Performed at [facility], [address] [place], [place] [address]  Culture, blood (Routine x 2)     Status: None (Preliminary result)   Collection Time: 08/18/17  9:51 PM   Result Value Ref Range Status   Specimen Description   Final    BLOOD LEFT FOREARM Performed at [facility], [address], [place], [place] [address]    Special Requests   Final    BOTTLES DRAWN AEROBIC ONLY Blood Culture adequate volume Performed at [facility], [address] [place], [place] [address]    Culture  Setup Time   Final    GRAM POSITIVE COCCI Gram Stain Report Called to,Read Back By and Verified With: HOWARD,C. AT 2138 ON 08/19/2017 BY EVA AEROBIC BOTTLE ONLY Performed at [facility] Organism ID to follow    Culture   Final    NO GROWTH 2 DAYS Performed at [facility], [address], [place], [place] [address]    Report Status PENDING  Incomplete  Blood Culture ID Panel (Reflexed)     Status: Abnormal   Collection Time: 08/18/17  9:51 PM  Result Value Ref Range Status   Enterococcus species NOT DETECTED NOT DETECTED Final   Listeria monocytogenes NOT DETECTED NOT DETECTED Final   Staphylococcus species DETECTED (A) NOT DETECTED Final    Comment: Methicillin (oxacillin) resistant coagulase negative staphylococcus. Possible blood culture contaminant (unless isolated from more than one blood culture draw or clinical case suggests pathogenicity). No antibiotic treatment is indicated for blood  culture contaminants. CRITICAL RESULT CALLED TO, READ BACK BY AND VERIFIED WITH: TO SGANNONS(RN) BY TCLEVELAND 08/20/17 AT 5:44AM    Staphylococcus aureus NOT DETECTED NOT DETECTED Final   Methicillin resistance DETECTED (A) NOT DETECTED Final    Comment: CRITICAL RESULT CALLED TO, READ BACK BY AND VERIFIED WITH: TO SGANNONS(RN) BY TCLEVELAND 08/20/17 AT 5:44AM    Streptococcus species NOT DETECTED NOT DETECTED Final   Streptococcus agalactiae NOT DETECTED NOT DETECTED Final   Streptococcus pneumoniae NOT DETECTED NOT DETECTED Final   Streptococcus pyogenes NOT DETECTED NOT DETECTED Final   Acinetobacter baumannii NOT DETECTED NOT DETECTED Final   Enterobacteriaceae  species NOT DETECTED NOT DETECTED Final   Enterobacter cloacae complex NOT DETECTED NOT DETECTED Final   Escherichia coli NOT DETECTED NOT DETECTED Final   Klebsiella  oxytoca NOT DETECTED NOT DETECTED Final   Klebsiella pneumoniae NOT DETECTED NOT DETECTED Final   Proteus species NOT DETECTED NOT DETECTED Final   Serratia marcescens NOT DETECTED NOT DETECTED Final   Haemophilus influenzae NOT DETECTED NOT DETECTED Final   Neisseria meningitidis NOT DETECTED NOT DETECTED Final   Pseudomonas aeruginosa NOT DETECTED NOT DETECTED Final   Candida albicans NOT DETECTED NOT DETECTED Final   Candida glabrata NOT DETECTED NOT DETECTED Final   Candida krusei NOT DETECTED NOT DETECTED Final   Candida parapsilosis NOT DETECTED NOT DETECTED Final   Candida tropicalis NOT DETECTED NOT DETECTED Final    Comment: Performed at [facility], [address], [place], [place] [address]  MRSA PCR Screening     Status: None   Collection Time: 08/19/17  1:09 AM  Result Value Ref Range Status   MRSA by PCR NEGATIVE NEGATIVE Final    Comment:        The GeneXpert MRSA Assay (FDA approved for NASAL specimens only), is one component of a comprehensive MRSA colonization surveillance program. It is not intended to diagnose MRSA infection nor to guide or monitor treatment for MRSA infections. Performed at [facility], [address], [place], [place] [address]   Respiratory Panel by PCR     Status: None   Collection Time: 08/19/17  1:55 AM  Result Value Ref Range Status   Adenovirus NOT DETECTED NOT DETECTED Final   Coronavirus 229E NOT DETECTED NOT DETECTED Final   Coronavirus HKU1 NOT DETECTED NOT DETECTED Final   Coronavirus NL63 NOT DETECTED NOT DETECTED Final   Coronavirus OC43 NOT DETECTED NOT DETECTED Final   Metapneumovirus NOT DETECTED NOT DETECTED Final   Rhinovirus / Enterovirus NOT DETECTED NOT DETECTED Final   Influenza A NOT DETECTED NOT DETECTED Final   Influenza B NOT DETECTED NOT  DETECTED Final   Parainfluenza Virus 1 NOT DETECTED NOT DETECTED Final   Parainfluenza Virus 2 NOT DETECTED NOT DETECTED Final   Parainfluenza Virus 3 NOT DETECTED NOT DETECTED Final   Parainfluenza Virus 4 NOT DETECTED NOT DETECTED Final   Respiratory Syncytial Virus NOT DETECTED NOT DETECTED Final   Bordetella pertussis NOT DETECTED NOT DETECTED Final   Chlamydophila pneumoniae NOT DETECTED NOT DETECTED Final   Mycoplasma pneumoniae NOT DETECTED NOT DETECTED Final    Comment: Performed at [facility], [address] [place], [place] [address]  Culture, respiratory (NON-Expectorated)     Status: None (Preliminary result)   Collection Time: 08/19/17  2:40 AM  Result Value Ref Range Status   Specimen Description   Final    TRACHEAL ASPIRATE Performed at [facility], [address], [place], [place] [address]    Special Requests   Final    NONE Performed at [facility], [address] [place], [place] [address]    Gram Stain   Final    ABUNDANT WBC PRESENT, PREDOMINANTLY PMN RARE SQUAMOUS EPITHELIAL CELLS PRESENT ABUNDANT GRAM POSITIVE COCCI IN PAIRS IN CLUSTERS FEW GRAM POSITIVE RODS FEW GRAM NEGATIVE RODS Performed at [facility], [address], [place], [place] [address]    Culture PENDING  Incomplete   Report Status PENDING  Incomplete    Medical History: Past Medical History:  Diagnosis Date  . Atrial premature beats   . Blood in stool   . Cerebral aneurysm   . Coronary atherosclerosis of native coronary artery   . Embolism (HCC)    History of right lower extremity distal  embolism   . Gout   . Hyperglycemia   . Hyperlipidemia, mixed   . Osteoarthritis   . Plantar fasciitis   . Sinusitis   . Stroke (HCC)   . Subarachnoid hemorrhage (HCC)   . Unspecified essential hypertension     Medications:  See medication history Assessment: Patient sent from nursing home with fever of 101 axillary tachycardia tachypnea and oxygen sats in the 70s.  He is  chronically trached and pegged. Pneumonia on x-ray. Vancomycin was stopped yesterday but will resume today as 2 BC are positive for MRSE. Plan:  Vnacomycin 1500 mg IV X 1 then vancomycin 1000 mg IV q12 hours Meropenem 1gm IV q8 hours F/u renal function, cultures and clinical course  Woodfin Ganja, [facility] 08/20/2017,8:43 AM

## 2017-08-20 NOTE — Progress Notes (Signed)
PROGRESS NOTE  Richard Mcpherson CMK:[id number] DOB: July 30, 1940 DOA: 08/18/2017 PCP: [name], MD  Brief History:  77 year old male with a history of stroke, hypertension, hyperlipidemia, coronary artery disease presented from skilled nursing facility with fevers times 1 day.  The patient has a complicated history which started after he was admitted to [facility] on 06/19/2017 when the patient had an unwitnessed fall and was noted to have mental status change with right hemiparesis.  The patient was transferred to [facility] where he was found to have an acute infarct in the right cerebral peduncle.  The patient received IV TPA.  The patient had a prolonged hospitalization with numerous complications.  The patient was subsequently noted to have a subdural hematoma on MRI.  During his hospitalization, the patient had an asystole cardiac arrest which was felt to be a result of his cervical spine contusion.  He was taken to surgery on 07/04/2017 where an occipital to C6 fusion and C1, C2, C3 laminectomies were performed.  The patient was initially extubated, but reintubated on the same day on 07/04/2017.  His Hospitalization was further complicated by a right axillary vein DVT resulting from a right upper extremity PICC line.  He was started on a heparin drip.  Repeat MRI of the brain on 07/14/2017 showed resolving SDH.  The patient subsequently developed ventilator associated pneumonia with Enterobacter.  He was treated with Zosyn.  Unfortunately, the patient continued to have fevers.  Infectious disease was consulted and followed the patient throughout the hospitalization.  Unfortunately, the patient had difficulty weaning from the ventilator.  Tracheostomy and gastrostomy tube were placed during his hospitalization.  [facility]  Records from that stay are not available at this time.LTAC nevertheless, the patient was weaned off the  ventilator, and he was discharged to a skilled nursing facility on 08/17/2017.  Unfortunately, the patient developed a fever up to 101.0 F on 08/18/2017.  As result, the patient was brought to emergency department for further evaluation.  He was noted to have a fever up to 102.5 F in the emergency department with tachycardia and hypoxia.  He was started on vancomycin and Zosyn and given intravenous fluids for sepsis.   At baseline since the patient's hospitalization and stay at [facility], the patient has been essentially bedbound.  He is noncommunicative verbally.  He is unable to voice or identify his needs.  He has been receiving enteral feedings and has remained completely n.p.o.  He is requiring 100% care for all his ADLs   Assessment/Plan: Sepsis -Secondary to pneumonia and bacteremia -there is likely a degree of aspiration -Check procalcitonin 0.22>>>0.44 -Lactic acid peaked 1.88 -Tracheal aspirate--prelim-GNR, GPC -Continue IV fluids -Continue Merrem -MRSA screen--neg  Acute on chronic respiratory failure with hypoxia -pt placed on mechanical ventilation 08/19/17 due to respiratory distress -continue diprivan -Consult pulmonary medicine -Pulmonary hygiene -Continue bronchodilators -Personally reviewed chest x-ray--LLL opacity -appreciate pulmonary consult--no weaning today  HAP/aspiration pneumonia -As discussed above  Bacteremia -BCID = CoNS -restart vancomycin -repeat blood cultures today -Echo  Right upper extremity DVT -Diagnosed by ultrasound 07/14/2017 -Secondary to PICC line which has been removed -Continue apixaban right upper extremity DVT -wife insists upon repeat US of RUE which will be ordered  Cervical contusion -Status post cervical fusion and cervical laminectomy 07/04/17 -Secondary to his unwitnessed fall in December 5697  Acute metabolic encephalopathy -due to infection in the setting of 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 CMP  Essential hypertension -Continue metoprolol tartrate--changed to IV for now  Transaminasemia -due to sepsis -trending down -RUQ US--GB sludge without cholecystitis  Stage III coccygeal decubitus -40% necrotic issue located primarily in the gluteal cleft and part of buttock immediately adjacent to that area -Present at the time of admission -Appreciate wound care consult  Goals of care -Long discussion with the patient's spouse at bedside--she seems to have unrealistic expectations -Consult palliative medicine -Patient remains full code/Full scope of care -overall prognosis poor    Disposition Plan:   SNF once stable Family Communication:   Spouse updated at bedside--Total time spent 45 minutes.  Greater than 50% spent face to face counseling and coordinating care.   Consultants:  pulmonary  Code Status:  FULL   DVT Prophylaxis:  apixaban   Procedures: As Listed in Progress Note Above  Antibiotics: vanco 2/3>>> Zosyn 2/3 Merrem 2/3>>>  The patient is critically ill with multiple organ systems failure and requires high complexity decision making for assessment and support, frequent evaluation and titration of therapies, application of advanced monitoring technologies and extensive interpretation of multiple databases.  Critical care time - 45 mins.        Subjective: The patient is sedated and on mechanical ventilation.  There is no reports of vomiting, respiratory distress, diarrhea, uncontrolled pain.  Objective: Vitals:   08/20/17 0645 08/20/17 0700 08/20/17 0755 08/20/17 0756  BP:  (!) 102/58     Pulse: (!) 102 (!) 104    Resp: 20 20    Temp:   98 F (36.7 C)   TempSrc:   Axillary   SpO2: 98% 98% 98% 98%  Weight:      Height:        Intake/Output Summary (Last 24 hours) at 08/20/2017 0833 Last data filed at 08/20/2017 8563 Gross per 24 hour  Intake 2076.48 ml  Output 550 ml  Net 1526.48 ml   Weight change:  Exam:   General:  Pt is alert, follows commands appropriately, not in acute distress  HEENT: No icterus, No thrush, No neck mass, [place]/AT  Cardiovascular: RRR, S1/S2, no rubs, no gallops  Respiratory: Bibasilar rales.  No wheezing.  Good air movement.  Abdomen: Soft/+BS, non tender, non distended, no guarding  Extremities: No edema, No lymphangitis, No petechiae, No rashes, no synovitis   Data Reviewed: I have personally reviewed following labs and imaging studies Basic Metabolic Panel: Recent Labs  Lab 08/18/17 2145 08/19/17 0450 08/20/17 0405  NA 145 146* 144  K 4.1 3.6 3.7  CL 109 112* 108  CO2 _0 GLUCOSE 147* 129* 93  BUN 39* 33* 34*  CREATININE 0.54* 0.39* 0.41*  CALCIUM 9.0 8.9 8.7*  MG  --   --  1.9  PHOS  --   --  3.2   Liver Function Tests: Recent Labs  Lab 08/18/17 2145 08/20/17 0405  AST 55* 24  ALT 97* 50  ALKPHOS 414* 260*  BILITOT 0.6 1.2  PROT 7.3 6.1*  ALBUMIN 2.3* 1.9*   No results for input(s): LIPASE, AMYLASE in the last 168 hours. No results for input(s): AMMONIA in the last 168 hours. Coagulation Profile: Recent Labs  Lab 08/17/17  8453 08/18/17 2145  INR 1.20 1.37   CBC: Recent Labs  Lab 08/18/17 2145 08/19/17 0450 08/20/17 0405  WBC 19.4* 21.9* 20.9*  NEUTROABS 16.9  --   --   HGB 11.8* 10.7* 9.8*  HCT 39.7 36.7* 33.5*  MCV 87.6 88.2 89.1  PLT 348 298 263   Cardiac Enzymes: No results for input(s): CKTOTAL, CKMB, CKMBINDEX, TROPONINI in the last 168 hours. BNP: Invalid input(s): POCBNP CBG: Recent Labs  Lab 08/19/17 0736  GLUCAP 135*   HbA1C: No results for input(s): HGBA1C in the  last 72 hours. Urine analysis:    Component Value Date/Time   COLORURINE AMBER (A) 08/18/2017 2146   APPEARANCEUR HAZY (A) 08/18/2017 2146   LABSPEC 1.025 08/18/2017 2146   PHURINE 6.0 08/18/2017 2146   GLUCOSEU NEGATIVE 08/18/2017 2146   HGBUR MODERATE (A) 08/18/2017 2146   BILIRUBINUR NEGATIVE 08/18/2017 2146   Stem NEGATIVE 08/18/2017 2146   PROTEINUR 30 (A) 08/18/2017 2146   NITRITE NEGATIVE 08/18/2017 2146   LEUKOCYTESUR NEGATIVE 08/18/2017 2146   Sepsis Labs: _0 (procalcitonin:4,lacticidven:4) ) Recent Results (from the past 240 hour(s))  Culture, blood (Routine x 2)     Status: None (Preliminary result)   Collection Time: 08/18/17  9:42 PM  Result Value Ref Range Status   Specimen Description   Final    LEFT ANTECUBITAL Performed at [facility], [address], [place], [place] [address]    Special Requests   Final    BOTTLES DRAWN AEROBIC AND ANAEROBIC Blood Culture adequate volume Performed at [facility], [address], [place], [place] [address]    Culture  Setup Time   Final    GRAM POSITIVE COCCI Gram Stain Report Called to,Read Back By and Verified With: GAMMONS,S. AT 0203 ON 08/20/2017 BY EVA Performed at [place] ID to follow    Culture   Final    NO GROWTH 2 DAYS Performed at [facility], [address] [place], [place] [address]    Report Status PENDING  Incomplete  Blood Culture ID Panel (Reflexed)     Status: Abnormal   Collection Time: 08/18/17  9:42 PM  Result Value Ref Range Status   Enterococcus species NOT DETECTED NOT DETECTED Final   Listeria monocytogenes NOT DETECTED NOT DETECTED Final   Staphylococcus species DETECTED (A) NOT DETECTED Final    Comment: Methicillin (oxacillin) resistant coagulase negative staphylococcus. Possible blood culture contaminant (unless isolated from more than one blood culture draw or clinical case suggests pathogenicity). No antibiotic treatment is indicated for blood  culture  contaminants. L. Seay Pharm.D. 7:45 08/20/17 (wilsonm)    Staphylococcus aureus NOT DETECTED NOT DETECTED Final   Methicillin resistance DETECTED (A) NOT DETECTED Final    Comment: CRITICAL RESULT CALLED TO, READ BACK BY AND VERIFIED WITH: L. Seay Pharm.D. 7:45 08/20/17 (wilsonm)    Streptococcus species NOT DETECTED NOT DETECTED Final   Streptococcus agalactiae NOT DETECTED NOT DETECTED Final   Streptococcus pneumoniae NOT DETECTED NOT DETECTED Final   Streptococcus pyogenes NOT DETECTED NOT DETECTED Final   Acinetobacter baumannii NOT DETECTED NOT DETECTED Final   Enterobacteriaceae species NOT DETECTED NOT DETECTED Final   Enterobacter cloacae complex NOT DETECTED NOT DETECTED Final   Escherichia coli NOT DETECTED NOT DETECTED Final   Klebsiella oxytoca NOT DETECTED NOT DETECTED Final   Klebsiella pneumoniae NOT DETECTED NOT DETECTED Final   Proteus species NOT DETECTED NOT DETECTED Final   Serratia marcescens NOT DETECTED NOT DETECTED Final   Haemophilus influenzae NOT DETECTED  NOT DETECTED Final   Neisseria meningitidis NOT DETECTED NOT DETECTED Final   Pseudomonas aeruginosa NOT DETECTED NOT DETECTED Final   Candida albicans NOT DETECTED NOT DETECTED Final   Candida glabrata NOT DETECTED NOT DETECTED Final   Candida krusei NOT DETECTED NOT DETECTED Final   Candida parapsilosis NOT DETECTED NOT DETECTED Final   Candida tropicalis NOT DETECTED NOT DETECTED Final    Comment: Performed at [facility], [place] [address] [place], [place] [address]  Culture, blood (Routine x 2)     Status: None (Preliminary result)   Collection Time: 08/18/17  9:51 PM  Result Value Ref Range Status   Specimen Description   Final    BLOOD LEFT FOREARM Performed at [facility], [address] [place], [place] [address]    Special Requests   Final    BOTTLES DRAWN AEROBIC ONLY Blood Culture adequate volume Performed at [facility], [address] [place], [place] [address]    Culture  Setup  Time   Final    GRAM POSITIVE COCCI Gram Stain Report Called to,Read Back By and Verified With: HOWARD,C. AT 2138 ON 08/19/2017 BY EVA AEROBIC BOTTLE ONLY Performed at [place] ID to follow    Culture   Final    NO GROWTH 2 DAYS Performed at [facility], [address], [place], [place] [address]    Report Status PENDING  Incomplete  Blood Culture ID Panel (Reflexed)     Status: Abnormal   Collection Time: 08/18/17  9:51 PM  Result Value Ref Range Status   Enterococcus species NOT DETECTED NOT DETECTED Final   Listeria monocytogenes NOT DETECTED NOT DETECTED Final   Staphylococcus species DETECTED (A) NOT DETECTED Final    Comment: Methicillin (oxacillin) resistant coagulase negative staphylococcus. Possible blood culture contaminant (unless isolated from more than one blood culture draw or clinical case suggests pathogenicity). No antibiotic treatment is indicated for blood  culture contaminants. CRITICAL RESULT CALLED TO, READ BACK BY AND VERIFIED WITH: TO SGANNONS(RN) BY TCLEVELAND 08/20/17 AT 5:44AM    Staphylococcus aureus NOT DETECTED NOT DETECTED Final   Methicillin resistance DETECTED (A) NOT DETECTED Final    Comment: CRITICAL RESULT CALLED TO, READ BACK BY AND VERIFIED WITH: TO SGANNONS(RN) BY TCLEVELAND 08/20/17 AT 5:44AM    Streptococcus species NOT DETECTED NOT DETECTED Final   Streptococcus agalactiae NOT DETECTED NOT DETECTED Final   Streptococcus pneumoniae NOT DETECTED NOT DETECTED Final   Streptococcus pyogenes NOT DETECTED NOT DETECTED Final   Acinetobacter baumannii NOT DETECTED NOT DETECTED Final   Enterobacteriaceae species NOT DETECTED NOT DETECTED Final   Enterobacter cloacae complex NOT DETECTED NOT DETECTED Final   Escherichia coli NOT DETECTED NOT DETECTED Final   Klebsiella oxytoca NOT DETECTED NOT DETECTED Final   Klebsiella pneumoniae NOT DETECTED NOT DETECTED Final   Proteus species NOT DETECTED NOT DETECTED Final   Serratia  marcescens NOT DETECTED NOT DETECTED Final   Haemophilus influenzae NOT DETECTED NOT DETECTED Final   Neisseria meningitidis NOT DETECTED NOT DETECTED Final   Pseudomonas aeruginosa NOT DETECTED NOT DETECTED Final   Candida albicans NOT DETECTED NOT DETECTED Final   Candida glabrata NOT DETECTED NOT DETECTED Final   Candida krusei NOT DETECTED NOT DETECTED Final   Candida parapsilosis NOT DETECTED NOT DETECTED Final   Candida tropicalis NOT DETECTED NOT DETECTED Final    Comment: Performed at [facility], [place] [address], [place],  [address]  MRSA PCR Screening  Status: None   Collection Time: 08/19/17  1:09 AM  Result Value Ref Range Status   MRSA by PCR NEGATIVE NEGATIVE Final    Comment:        The GeneXpert MRSA Assay (FDA approved for NASAL specimens only), is one component of a comprehensive MRSA colonization surveillance program. It is not intended to diagnose MRSA infection nor to guide or monitor treatment for MRSA infections. Performed at [facility], [address] [place], [place] [address]   Respiratory Panel by PCR     Status: None   Collection Time: 08/19/17  1:55 AM  Result Value Ref Range Status   Adenovirus NOT DETECTED NOT DETECTED Final   Coronavirus 229E NOT DETECTED NOT DETECTED Final   Coronavirus HKU1 NOT DETECTED NOT DETECTED Final   Coronavirus NL63 NOT DETECTED NOT DETECTED Final   Coronavirus OC43 NOT DETECTED NOT DETECTED Final   Metapneumovirus NOT DETECTED NOT DETECTED Final   Rhinovirus / Enterovirus NOT DETECTED NOT DETECTED Final   Influenza A NOT DETECTED NOT DETECTED Final   Influenza B NOT DETECTED NOT DETECTED Final   Parainfluenza Virus 1 NOT DETECTED NOT DETECTED Final   Parainfluenza Virus 2 NOT DETECTED NOT DETECTED Final   Parainfluenza Virus 3 NOT DETECTED NOT DETECTED Final   Parainfluenza Virus 4 NOT DETECTED NOT DETECTED Final   Respiratory Syncytial Virus NOT DETECTED NOT DETECTED Final   Bordetella pertussis  NOT DETECTED NOT DETECTED Final   Chlamydophila pneumoniae NOT DETECTED NOT DETECTED Final   Mycoplasma pneumoniae NOT DETECTED NOT DETECTED Final    Comment: Performed at [facility], [place] [address], [place], [place] [address]  Culture, respiratory (NON-Expectorated)     Status: None (Preliminary result)   Collection Time: 08/19/17  2:40 AM  Result Value Ref Range Status   Specimen Description   Final    TRACHEAL ASPIRATE Performed at [facility], [address], [place], [place] [address]    Special Requests   Final    NONE Performed at [facility], [address], [place], [place] [address]    Gram Stain   Final    ABUNDANT WBC PRESENT, PREDOMINANTLY PMN RARE SQUAMOUS EPITHELIAL CELLS PRESENT ABUNDANT GRAM POSITIVE COCCI IN PAIRS IN CLUSTERS FEW GRAM POSITIVE RODS FEW GRAM NEGATIVE RODS Performed at [facility], [place] [address], [place], [place] [address]    Culture PENDING  Incomplete   Report Status PENDING  Incomplete     Scheduled Meds: . allopurinol  100 mg Per Tube Daily  . amantadine  100 mg Per Tube BID  . apixaban  2.5 mg Per Tube BID  . chlorhexidine gluconate (MEDLINE KIT)  15 mL Mouth Rinse BID  . collagenase  1 application Topical Daily  . famotidine  20 mg Per Tube BID  . ipratropium-albuterol  3 mL Nebulization Q6H  . mouth rinse  15 mL Mouth Rinse QID  . metoprolol tartrate  2.5 mg Intravenous Q6H  . omega-3 acid ethyl esters  1 g Per Tube Daily  . polyethylene glycol  17 g Oral Daily  . sertraline  50 mg Per Tube Daily  . tamsulosin  0.4 mg Oral Daily   Continuous Infusions: . 0.45 % NaCl with KCl 20 mEq / L 75 mL/hr at 08/19/17 2339  . meropenem (MERREM) IV Stopped (08/20/17 1308)  . propofol (DIPRIVAN) infusion 30 mcg/kg/min (08/20/17 0453)  . vancomycin      Procedures/Studies: Ct Abdomen Wo Contrast  Result Date: 08/03/2017 CLINICAL  DATA:  Gastrostomy tube evaluation EXAM: CT ABDOMEN WITHOUT CONTRAST TECHNIQUE: Multidetector CT  imaging of the abdomen was performed following the standard protocol without IV contrast. COMPARISON:  None. FINDINGS: Lower chest: Bibasilar dependent consolidation left greater than right. Hepatobiliary: Unremarkable Pancreas: Unremarkable Spleen: Unremarkable Adrenals/Urinary Tract: Vascular calcifications are noted. Renal parenchyma is within normal limits. Adrenal glands are within normal limits. Stomach/Bowel: NG tube is in place. The stomach is positioned between the liver and transverse colon. No obvious mass in the colon. No evidence of small-bowel obstruction. Vascular/Lymphatic: Atherosclerotic calcifications of the aorta and iliac arteries. Other: No free fluid. Musculoskeletal: No vertebral compression deformity. Degenerative changes in the hips and lumbar spine. Additional imaging through the pelvis: Bladder is unremarkable. Prostate is within normal limits. IMPRESSION: There is favorable anatomy for gastrostomy tube placement. Bibasilar pulmonary consolidation left greater than right. Electronically Signed   By: Marybelle Killings M.D.   On: 08/03/2017 08:15   [name]  Result Date: 07/25/2017 CLINICAL DATA:  Ventilator support.  Multiple strokes. EXAM: MRI HEAD WITHOUT CONTRAST TECHNIQUE: Multiplanar, multiecho pulse sequences of the brain and surrounding structures were obtained without intravenous contrast. COMPARISON:  CT studies 06/29/2017.  MRI 04/25/2017. FINDINGS: Brain: Diffusion imaging shows a 4 mm acute infarction in the cerebral peduncle on the right. No other acute infarction. There are old small vessel cerebellar infarctions. There is old infarction within the left para median pons. There chronic small-vessel ischemic changes of the hemispheric white matter. No large vessel territory infarction. Chronic ventriculomegaly without change. Previously coiled circle of Willis aneurysm. I suspect that there is a thin subdural hematoma in the posterior fossa on the right. This could be  artifact, given pronounced susceptibility artifact in the region, but I think is probably real. Maximal thickness 3 mm. No mass effect. The patient has some layering blood products in the occipital horns of both lateral ventricles. Vascular: Major vessels at the base of the brain show flow. Skull and upper cervical spine: It appears that the patient has had extensive cervical decompression and fusion, possibly with extension to the occiput. Sinuses/Orbits: Clear/normal Other: None IMPRESSION: Acute 4 mm infarction right cerebral peduncle. Thin subdural hematoma posterior fossa on the right, maximal thickness 3 mm. Small amount of blood dependent in the occipital horns of both lateral ventricles. Question if these hemorrhagic findings relate to the recent craniocervical surgical procedure. Chronic ischemic changes elsewhere throughout the brain. Chronic ventriculomegaly. Electronically Signed   By: Nelson Chimes M.D.   On: 07/25/2017 14:26   Ir Gastrostomy Tube Mod Sed  Result Date: 08/06/2017 INDICATION: Dysphagia EXAM: TWENTY FRENCH PULL-THROUGH GASTROSTOMY Date:  08/06/2017 08/06/2017 9:36 am Radiologist:  M. Daryll Brod, MD Guidance:  Ultrasound and fluoroscopic MEDICATIONS: 1 g Ancef; Antibiotics were administered within 1 hour of the procedure. Glucagon 0.5 mg IV ANESTHESIA/SEDATION: Versed 0.5 mg IV; Fentanyl 25 mcg IV Moderate Sedation Time:  15 The patient was continuously monitored during the procedure by the interventional radiology nurse under my direct supervision. CONTRAST:  10 cc Isovue-300-administered into the gastric lumen. FLUOROSCOPY TIME:  Fluoroscopy Time: 2 minutes 30 seconds (12 mGy). COMPLICATIONS: None immediate. PROCEDURE: Informed consent was obtained from the patient following explanation of the procedure, risks, benefits and alternatives. The patient understands, agrees and consents for the procedure. All questions were addressed. A time out was performed. Maximal barrier sterile  technique utilized including caps, mask, sterile gowns, sterile gloves, large sterile drape, hand hygiene, and betadine prep. The left upper quadrant was sterilely  prepped and draped. An oral gastric catheter was inserted into the stomach under fluoroscopy. The existing nasogastric feeding tube was removed. Air was injected into the stomach for insufflation and visualization under fluoroscopy. The air distended stomach was confirmed beneath the anterior abdominal wall in the frontal and lateral projections. Under sterile conditions and local anesthesia, a 8 gauge trocar needle was utilized to access the stomach percutaneously beneath the left subcostal margin. Needle position was confirmed within the stomach under biplane fluoroscopy. Contrast injection confirmed position also. A single T tack was deployed for gastropexy. Over an Amplatz guide wire, a 9-French sheath was inserted into the stomach. A snare device was utilized to capture the oral gastric catheter. The snare device was pulled retrograde from the stomach up the esophagus and out the oropharynx. The 20-French pull-through gastrostomy was connected to the snare device and pulled antegrade through the oropharynx down the esophagus into the stomach and then through the percutaneous tract external to the patient. The gastrostomy was assembled externally. Contrast injection confirms position in the stomach. Images were obtained for documentation. The patient tolerated procedure well. No immediate complication. IMPRESSION: Fluoroscopic insertion of a 20-French "pull-through" gastrostomy. Electronically Signed   By: Jerilynn Mages.  Shick M.D.   On: 08/06/2017 09:52   Dg Chest Port 1 View  Result Date: 08/20/2017 CLINICAL DATA:  Acute respiratory failure EXAM: PORTABLE CHEST 1 VIEW COMPARISON:  08/19/2017 FINDINGS: Cardiac shadow is stable. Aortic calcifications are again seen. Tracheostomy tube is again seen and stable. Lungs are well aerated with the exception of  persistent left retrocardiac density. The overall appearance however has improved when compare with the prior exam. IMPRESSION: Persistent but significantly improved left retrocardiac density. Electronically Signed   By: Inez Catalina M.D.   On: 08/20/2017 07:09   Dg Chest Port 1 View  Result Date: 08/19/2017 CLINICAL DATA:  Follow-up healthcare associated pneumonia, sepsis EXAM: PORTABLE CHEST 1 VIEW COMPARISON:  08/18/2017 FINDINGS: Moderate left pleural effusion, mildly increased. Associated left lower lobe opacity, favoring compressive atelectasis, pneumonia not excluded. Mild left suprahilar opacity, new, favoring asymmetric interstitial edema given the rapid change. Right lung is essentially clear. No pneumothorax. Tracheostomy in satisfactory position. The heart is top-normal in size. IMPRESSION: Mild left suprahilar opacity, new, favoring asymmetric interstitial edema given the rapid change. Moderate left pleural effusion, increased. Associated left lower lobe opacity, favoring compressive atelectasis, pneumonia not excluded. Electronically Signed   By: Julian Hy M.D.   On: 08/19/2017 07:14   Dg Chest Portable 1 View  Result Date: 08/18/2017 CLINICAL DATA:  Chronic ventilator dependent respiratory failure, presenting with fever, tachycardia, tachypnea and hypoxia. Personal history of stroke. EXAM: PORTABLE CHEST 1 VIEW COMPARISON:  07/22/2017, 11/23/2016 and earlier. FINDINGS: Tracheostomy tube tip in satisfactory position below the thoracic inlet. Cardiac silhouette upper normal in size for AP portable technique, unchanged. Dense airspace consolidation involving the left lower lobe. Lungs otherwise clear. 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 IMPRESSION: 1. Dense left lower lobe pneumonia and/or atelectasis. 2. 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 Electronically Signed   By: Evangeline Dakin M.D.   On: 08/18/2017 22:15   Dg Chest Port 1 View  Result  Date: 07/22/2017 CLINICAL DATA:  Respiratory failure EXAM: PORTABLE CHEST 1 VIEW COMPARISON:  11/23/2016 FINDINGS: Tracheostomy in satisfactory position. Enteric tube courses into the stomach. Left arm PICC terminates in the lower SVC. Left lower lobe opacity, likely a combination of atelectasis and small left pleural effusion. The heart is normal in size. IMPRESSION:  Left lower lobe opacity, likely combination of atelectasis and small left pleural effusion. Tracheostomy in satisfactory position. Additional support apparatus as above. Electronically Signed   By: Julian Hy M.D.   On: 07/22/2017 09:56   US Abdomen Limited Ruq  Result Date: 08/19/2017 CLINICAL DATA:  Elevated LFTs EXAM: ULTRASOUND ABDOMEN LIMITED RIGHT UPPER QUADRANT COMPARISON:  CT abdomen/pelvis dated 08/02/2017 FINDINGS: Gallbladder: Layering gallbladder sludge. No gallbladder wall thickening or pericholecystic fluid. Common bile duct: Diameter: 3 mm Liver: No focal lesion identified. Within normal limits in parenchymal echogenicity. Portal vein is patent on color Doppler imaging with normal direction of blood flow towards the liver. IMPRESSION: Layering gallbladder sludge, without associated inflammatory changes. Electronically Signed   By: Julian Hy M.D.   On: 08/19/2017 11:27    Orson Eva, DO  [facility] Pager [phone]  If 7PM-7AM, please contact night-coverage [email or web address] Password [email or web address] 08/20/2017, 8:33 AM   LOS: 2 days

## 2017-08-20 NOTE — Progress Notes (Signed)
Palliative Medicine RN Note: Consult order noted. PMT provider will return to [facility] tomorrow 2/5, and Mr Latz will be evaluated at that time. Please call our office if recommendations are needed in the interim.  Margret Chance Lametria Klunk, RN, BSN, [facility] Palliative Medicine Team 08/20/2017 2:39 PM Office [phone]

## 2017-08-21 ENCOUNTER — Inpatient Hospital Stay (HOSPITAL_COMMUNITY): Payer: Medicare Other

## 2017-08-21 DIAGNOSIS — E43 Unspecified severe protein-calorie malnutrition: Secondary | ICD-10-CM

## 2017-08-21 LAB — HEPATIC FUNCTION PANEL
ALBUMIN: 1.6 g/dL — AB (ref 3.5–5.0)
ALT: 56 U/L (ref 17–63)
AST: 52 U/L — AB (ref 15–41)
Alkaline Phosphatase: 279 U/L — ABNORMAL HIGH (ref 38–126)
Bilirubin, Direct: 0.5 mg/dL (ref 0.1–0.5)
Indirect Bilirubin: 0.4 mg/dL (ref 0.3–0.9)
Total Bilirubin: 0.9 mg/dL (ref 0.3–1.2)
Total Protein: 5.6 g/dL — ABNORMAL LOW (ref 6.5–8.1)

## 2017-08-21 LAB — BLOOD GAS, ARTERIAL
Acid-Base Excess: 1.7 mmol/L (ref 0.0–2.0)
BICARBONATE: 26 mmol/L (ref 20.0–28.0)
Drawn by: 22223
FIO2: 40
LHR: 20 {breaths}/min
O2 Saturation: 97.4 %
PEEP/CPAP: 5 cmH2O
VT: 620 mL
pCO2 arterial: 36.4 mmHg (ref 32.0–48.0)
pH, Arterial: 7.455 — ABNORMAL HIGH (ref 7.350–7.450)
pO2, Arterial: 94.8 mmHg (ref 83.0–108.0)

## 2017-08-21 LAB — CULTURE, RESPIRATORY: CULTURE: NORMAL

## 2017-08-21 LAB — GLUCOSE, CAPILLARY: Glucose-Capillary: 120 mg/dL — ABNORMAL HIGH (ref 65–99)

## 2017-08-21 LAB — BASIC METABOLIC PANEL
Anion gap: 8 (ref 5–15)
BUN: 31 mg/dL — ABNORMAL HIGH (ref 6–20)
CALCIUM: 8.3 mg/dL — AB (ref 8.9–10.3)
CHLORIDE: 110 mmol/L (ref 101–111)
CO2: 23 mmol/L (ref 22–32)
CREATININE: 0.3 mg/dL — AB (ref 0.61–1.24)
GFR calc non Af Amer: >60 mL/min (ref >60)
Glucose, Bld: 112 mg/dL — ABNORMAL HIGH (ref 65–99)
Potassium: 3.4 mmol/L — ABNORMAL LOW (ref 3.5–5.1)
SODIUM: 141 mmol/L (ref 135–145)

## 2017-08-21 LAB — CULTURE, RESPIRATORY W GRAM STAIN

## 2017-08-21 LAB — CBC
HCT: 28.8 % — ABNORMAL LOW (ref 39.0–52.0)
Hemoglobin: 8.4 g/dL — ABNORMAL LOW (ref 13.0–17.0)
MCH: 25.6 pg — ABNORMAL LOW (ref 26.0–34.0)
MCHC: 29.2 g/dL — ABNORMAL LOW (ref 30.0–36.0)
MCV: 87.8 fL (ref 78.0–100.0)
PLATELETS: 224 10*3/uL (ref 150–400)
RBC: 3.28 MIL/uL — AB (ref 4.22–5.81)
RDW: 16.4 % — ABNORMAL HIGH (ref 11.5–15.5)
WBC: 12.5 10*3/uL — AB (ref 4.0–10.5)

## 2017-08-21 LAB — PROCALCITONIN: PROCALCITONIN: 0.58 ng/mL

## 2017-08-21 NOTE — Progress Notes (Signed)
PHARMACY - PHYSICIAN COMMUNICATION CRITICAL VALUE ALERT - BLOOD CULTURE IDENTIFICATION (BCID)  Richard Mcpherson is an 77 y.o. male who presented to [facility] on 08/18/2017 with a chief complaint of sepsis and bacteremia.  Name of physician (or Provider) Contacted: Tat  Current antibiotics: Vancomycin and Meropenem  Changes to prescribed antibiotics recommended:  Continue current Rx pending finalized cultures.  Consider ID consult for ABX choice and length of therapy.     Results for orders placed or performed during the hospital encounter of 08/18/17  Blood Culture ID Panel (Reflexed) (Collected: 08/18/2017  9:42 PM)  Result Value Ref Range   Enterococcus species NOT DETECTED NOT DETECTED   Listeria monocytogenes NOT DETECTED NOT DETECTED   Staphylococcus species DETECTED (A) NOT DETECTED   Staphylococcus aureus NOT DETECTED NOT DETECTED   Methicillin resistance DETECTED (A) NOT DETECTED   Streptococcus species NOT DETECTED NOT DETECTED   Streptococcus agalactiae NOT DETECTED NOT DETECTED   Streptococcus pneumoniae NOT DETECTED NOT DETECTED   Streptococcus pyogenes NOT DETECTED NOT DETECTED   Acinetobacter baumannii NOT DETECTED NOT DETECTED   Enterobacteriaceae species NOT DETECTED NOT DETECTED   Enterobacter cloacae complex NOT DETECTED NOT DETECTED   Escherichia coli NOT DETECTED NOT DETECTED   Klebsiella oxytoca NOT DETECTED NOT DETECTED   Klebsiella pneumoniae NOT DETECTED NOT DETECTED   Proteus species NOT DETECTED NOT DETECTED   Serratia marcescens NOT DETECTED NOT DETECTED   Haemophilus influenzae NOT DETECTED NOT DETECTED   Neisseria meningitidis NOT DETECTED NOT DETECTED   Pseudomonas aeruginosa NOT DETECTED NOT DETECTED   Candida albicans NOT DETECTED NOT DETECTED   Candida glabrata NOT DETECTED NOT DETECTED   Candida krusei NOT DETECTED NOT DETECTED   Candida parapsilosis NOT DETECTED NOT DETECTED   Candida tropicalis NOT DETECTED NOT DETECTED    Richard Mcpherson  A 08/21/2017  1:28 PM

## 2017-08-21 NOTE — Progress Notes (Signed)
PROGRESS NOTE  Richard Mcpherson EKC:[id number] DOB: [birth year] DOA: 08/18/2017 PCP: [name], MD   Brief History: 77 year old male with a history of stroke, hypertension, hyperlipidemia, coronary artery disease presented from skilled nursing facility with fevers times 1 day. The patient has a complicated history which started after he was admitted to [facility] 06/19/2017 when the patient had an unwitnessed fall and was noted to have mental status change with right hemiparesis.The patient was transferred to [facility] where he was found to have an acute infarct in the right cerebral peduncle. The patient received IV TPA. The patient had a prolonged hospitalization with numerous complications. The patient was subsequently noted to have a subdural hematoma on MRI. During his hospitalization, the patient had an asystole cardiac arrest which was felt to be a result of his cervical spine contusion. He was taken to surgery on 07/04/2017 where an occipital to C6 fusion and C1, C2, C3 laminectomies were performed. The patient was initially extubated, but reintubated on the same day on 07/04/2017. His Hospitalization was further complicated by a right axillary vein DVT resulting from a right upper extremity PICC line. He was started on a heparin drip. Repeat MRI of the brain on 07/14/2017 showed resolving SDH. The patient subsequently developed ventilator associated pneumonia with Enterobacter. He was treated with Zosyn. Unfortunately, the patient continued to have fevers. Infectious disease was consulted and followed the patient throughout the hospitalization. Unfortunately, the patient had difficulty weaning from the ventilator. Tracheostomy and gastrostomy tube were placed during his hospitalization. [facility] Records from that stay are not available at this time.LTACnevertheless, the patient was weaned off the  ventilator,and he was discharged to a skilled nursing facility on 08/17/2017. Unfortunately, the patient developed a fever up to 101.0 F on 08/18/2017. As result, the patient was brought to emergency department for further evaluation. He was noted to have a fever up to 102.5 F in the emergency department with tachycardia and hypoxia. He was started on vancomycin and Zosyn and given intravenous fluids for sepsis.  At baseline since the patient's hospitalization and stay at [facility], the patient has been essentially bedbound. He is noncommunicative verbally. He is unable to voice or identify his needs. He has been receiving enteral feedings and has remained completely n.p.o. He is requiring 100% care for all his ADLs   Assessment/Plan: Sepsis -Secondary to pneumoniaand bacteremia -there is likely a degree of aspiration -Check procalcitonin 0.22>>>0.44>>>0.58 -Lactic acid peaked 1.88 -Tracheal aspirate--prelim-GNR, GPC -Continue IV fluids -Continue Merrem -MRSA screen--neg -remains febrile but temp trending down -CT chest  Acute on chronic respiratory failure with hypoxia -pt placed on mechanical ventilation 08/19/17 due to respiratory distress -continue diprivan -Consulted pulmonary medicine -Pulmonary hygiene -Continue bronchodilators -2/5 Personally reviewed chest x-ray--LLLopacity -appreciate pulmonary consult--no weaning today  HAP/aspiration pneumonia -As discussed above  Bacteremia -08/18/17 blood culture-- CoNS -continue vancomycin pending final data -2/4 repeat blood cultures neg to date -08/20/17 Echo--EF 65-70%, no WMA, no vegetations  Right upper extremity DVT -Diagnosed by ultrasound 07/14/2017 -Secondary to PICC line which has been removed -Continue apixaban right upper extremity DVT -wife insists upon repeat US of RUE which will be ordered -08/20/17--RUE duplex--neg for DVT  Cervical contusion -Status post cervical fusion and cervical laminectomy  07/04/17 -Secondary to his unwitnessed fall in December 5056  Acute metabolic encephalopathy -due to infection in the setting of 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 CMP  Essential hypertension -Continue metoprolol tartrate--changed to IV for now  Transaminasemia -due to sepsis -trending down -RUQ US--GB sludge without cholecystitis  Stage III coccygeal decubitus -40% necrotic issue located primarily in the gluteal cleft and part of buttock immediately adjacent to that area -Present at the time of admission -Appreciate wound care consult  Goals of care -Long discussion with the patient's spouse at bedside--she has unrealistic expectations -Palliative medicine consult appreciated-->continue full scope of care -overall prognosis poor  Moderate malnutrition -continue enteral feedings and supplements    Disposition Plan:SNF once stable Family Communication:Spouse updatedat bedside--Total time spent35 minutes. Greater than 50% spent face to face counseling and coordinating care.   Consultants:pulmonary  Code Status: FULL   DVT Prophylaxis:apixaban   Procedures: As Listed in Progress Note Above  Antibiotics: vanco 2/3>>> Zosyn 2/3 Merrem 2/3>>>  The patient is critically ill with multiple organ systems failure and requires high complexity decision making for assessment and support, frequent evaluation and titration of therapies, application of advanced monitoring technologies and extensive interpretation of multiple databases.   Critical care time -35 mins.         Subjective: Patient opens his eyes to tactile stimuli, but remains sedated on the ventilator.  There is no reports of vomiting, diarrhea, respiratory distress, uncontrolled pain.  Objective: Vitals:   08/21/17 0800 08/21/17 0900 08/21/17 1000 08/21/17 1100  BP: 107/61 (!) 107/58 (!) 112/55 (!) 116/58  Pulse: 93 94 95 86  Resp: '20 20 20 20  ' Temp: 98.8 F (37.1 C)   (!) 101.5 F (38.6 C)  TempSrc:    Axillary  SpO2: 96% 95% 96% 96%  Weight:      Height:        Intake/Output Summary (Last 24 hours) at 08/21/2017 1248 Last data filed at 08/21/2017 1137 Gross per 24 hour  Intake 4432.47 ml  Output 450 ml  Net 3982.47 ml   Weight change:  Exam:   General:  Pt is alert,not in acute distress  HEENT: No icterus, No thrush, No neck mass, [place]/AT  Cardiovascular: RRR, S1/S2, no rubs, no gallops  Respiratory: Bibasilar rales.  No wheezing.  Abdomen: Soft/+BS,  non distended, no guarding  Extremities: No edema, No lymphangitis, No petechiae, No rashes, no synovitis   Data Reviewed: I have personally reviewed following labs and imaging studies Basic Metabolic Panel: Recent Labs  Lab 08/18/17 2145 08/19/17 0450 08/20/17 0405  NA 145 146* 144  K 4.1 3.6 3.7  CL 109 112* 108  CO2 '26 24 23  ' GLUCOSE 147* 129* 93  BUN 39* 33* 34*  CREATININE 0.54* 0.39* 0.41*  CALCIUM 9.0 8.9 8.7*  MG  --   --  1.9  PHOS  --   --  3.2   Liver Function Tests: Recent Labs  Lab 08/18/17 2145 08/20/17 0405  AST 55* 24  ALT 97* 50  ALKPHOS 414* 260*  BILITOT 0.6 1.2  PROT 7.3 6.1*  ALBUMIN 2.3* 1.9*   No results for input(s): LIPASE, AMYLASE in the last 168 hours. No results for input(s): AMMONIA in the last 168 hours. Coagulation Profile: Recent Labs  Lab 08/17/17 0548 08/18/17 2145  INR 1.20 1.37   CBC: Recent Labs  Lab 08/18/17 2145 08/19/17 0450 08/20/17 0405  WBC 19.4* 21.9* 20.9*  NEUTROABS 16.9  --   --   HGB 11.8*  10.7* 9.8*  HCT 39.7 36.7* 33.5*  MCV 87.6 88.2 89.1  PLT 348 298 263   Cardiac Enzymes: No results for input(s): CKTOTAL, CKMB, CKMBINDEX, TROPONINI in the last 168 hours. BNP: Invalid input(s): POCBNP CBG: Recent Labs  Lab 08/19/17 0736  GLUCAP 135*   HbA1C: No results for input(s): HGBA1C in the last 72 hours. Urine analysis:    Component Value Date/Time   COLORURINE AMBER (A) 08/18/2017 2146   APPEARANCEUR HAZY (A) 08/18/2017 2146   LABSPEC 1.025 08/18/2017 2146   PHURINE 6.0 08/18/2017 2146   GLUCOSEU NEGATIVE 08/18/2017 2146   HGBUR MODERATE (A) 08/18/2017 2146   BILIRUBINUR NEGATIVE 08/18/2017 2146   [place] NEGATIVE 08/18/2017 2146   PROTEINUR 30 (A) 08/18/2017 2146   NITRITE NEGATIVE 08/18/2017 2146   LEUKOCYTESUR NEGATIVE 08/18/2017 2146   Sepsis Labs: '@LABRCNTIP' (procalcitonin:4,lacticidven:4) ) Recent Results (from the past 240 hour(s))  Culture, blood (Routine x 2)     Status: Abnormal (Preliminary result)   Collection Time: 08/18/17  9:42 PM  Result Value Ref Range Status   Specimen Description   Final    LEFT ANTECUBITAL Performed at [facility], [address], [place], [place] [address]    Special Requests   Final    BOTTLES DRAWN AEROBIC AND ANAEROBIC Blood Culture adequate volume Performed at [facility], [address] [place], [place] [address]    Culture  Setup Time   Final    GRAM POSITIVE COCCI Gram Stain Report Called to,Read Back By and Verified With: GAMMONS,S. AT 0203 ON 08/20/2017 BY EVA Performed at [place] (COAGULASE NEGATIVE) (A)  Final   Report Status PENDING  Incomplete  Blood Culture ID Panel (Reflexed)     Status: Abnormal   Collection Time: 08/18/17  9:42 PM  Result Value Ref Range Status   Enterococcus species NOT DETECTED NOT DETECTED Final   Listeria monocytogenes NOT DETECTED NOT DETECTED Final   Staphylococcus species DETECTED (A) NOT DETECTED Final    Comment:  Methicillin (oxacillin) resistant coagulase negative staphylococcus. Possible blood culture contaminant (unless isolated from more than one blood culture draw or clinical case suggests pathogenicity). No antibiotic treatment is indicated for blood  culture contaminants. L. Seay Pharm.D. 7:45 08/20/17 (wilsonm)    Staphylococcus aureus NOT DETECTED NOT DETECTED Final   Methicillin resistance DETECTED (A) NOT DETECTED Final    Comment: CRITICAL RESULT CALLED TO, READ BACK BY AND VERIFIED WITH: L. Seay Pharm.D. 7:45 08/20/17 (wilsonm)    Streptococcus species NOT DETECTED NOT DETECTED Final   Streptococcus agalactiae NOT DETECTED NOT DETECTED Final   Streptococcus pneumoniae NOT DETECTED NOT DETECTED Final   Streptococcus pyogenes NOT DETECTED NOT DETECTED Final   Acinetobacter baumannii NOT DETECTED NOT DETECTED Final   Enterobacteriaceae species NOT DETECTED NOT DETECTED Final   Enterobacter cloacae complex NOT DETECTED NOT DETECTED Final   Escherichia coli NOT DETECTED NOT DETECTED Final   Klebsiella oxytoca NOT DETECTED NOT DETECTED Final   Klebsiella pneumoniae NOT DETECTED NOT DETECTED Final   Proteus species NOT DETECTED NOT DETECTED Final   Serratia marcescens NOT DETECTED NOT DETECTED Final   Haemophilus influenzae NOT DETECTED NOT DETECTED Final   Neisseria meningitidis NOT DETECTED NOT DETECTED Final   Pseudomonas aeruginosa NOT DETECTED NOT DETECTED Final   Candida albicans NOT DETECTED NOT DETECTED Final  Candida glabrata NOT DETECTED NOT DETECTED Final   Candida krusei NOT DETECTED NOT DETECTED Final   Candida parapsilosis NOT DETECTED NOT DETECTED Final   Candida tropicalis NOT DETECTED NOT DETECTED Final    Comment: Performed at [facility], [place] [address] [place], [place] [address]  Urine culture     Status: None   Collection Time: 08/18/17  9:46 PM  Result Value Ref Range Status   Specimen Description   Final    URINE, CLEAN CATCH Performed at [facility], [address] [place], [place] [address]    Special Requests   Final    NONE Performed at [facility], [address], [place], [place] [address]    Culture   Final    NO GROWTH Performed at [facility], [place] [address] [place], [place] [address]    Report Status 08/20/2017 FINAL  Final  Culture, blood (Routine x 2)     Status: Abnormal (Preliminary result)   Collection Time: 08/18/17  9:51 PM  Result Value Ref Range Status   Specimen Description   Final    BLOOD LEFT FOREARM Performed at [facility], [address], [place], [place] [address]    Special Requests   Final    BOTTLES DRAWN AEROBIC ONLY Blood Culture adequate volume Performed at [facility], [address] [place], [place] [address]    Culture  Setup Time   Final    GRAM POSITIVE COCCI Gram Stain Report Called to,Read Back By and Verified With: HOWARD,C. AT 2138 ON 08/19/2017 BY EVA AEROBIC BOTTLE ONLY Performed at [place] (COAGULASE NEGATIVE) (A)  Final   Report Status PENDING  Incomplete  Blood Culture ID Panel (Reflexed)     Status: Abnormal   Collection Time: 08/18/17  9:51 PM  Result Value Ref Range Status   Enterococcus species NOT DETECTED NOT DETECTED Final   Listeria monocytogenes NOT DETECTED NOT DETECTED Final   Staphylococcus species DETECTED (A) NOT DETECTED Final    Comment: Methicillin (oxacillin) resistant coagulase negative staphylococcus. Possible blood culture contaminant (unless isolated from more than one blood culture draw or clinical case suggests pathogenicity). No antibiotic treatment is indicated for blood  culture contaminants. CRITICAL RESULT CALLED TO, READ BACK BY AND VERIFIED WITH: TO SGANNONS(RN) BY TCLEVELAND 08/20/17 AT 5:44AM    Staphylococcus aureus NOT DETECTED NOT DETECTED Final   Methicillin resistance DETECTED (A) NOT DETECTED Final    Comment: CRITICAL RESULT CALLED TO, READ BACK BY AND VERIFIED WITH: TO  SGANNONS(RN) BY TCLEVELAND 08/20/17 AT 5:44AM    Streptococcus species NOT DETECTED NOT DETECTED Final   Streptococcus agalactiae NOT DETECTED NOT DETECTED Final   Streptococcus pneumoniae NOT DETECTED NOT DETECTED Final   Streptococcus pyogenes NOT DETECTED NOT DETECTED Final   Acinetobacter baumannii NOT DETECTED NOT DETECTED Final   Enterobacteriaceae species NOT DETECTED NOT DETECTED Final   Enterobacter cloacae complex NOT DETECTED NOT DETECTED Final   Escherichia coli NOT DETECTED NOT DETECTED Final   Klebsiella oxytoca NOT DETECTED NOT DETECTED Final   Klebsiella pneumoniae NOT DETECTED NOT DETECTED Final   Proteus species NOT DETECTED NOT DETECTED Final   Serratia marcescens NOT DETECTED NOT DETECTED Final   Haemophilus influenzae NOT DETECTED NOT DETECTED Final   Neisseria meningitidis NOT DETECTED NOT DETECTED Final   Pseudomonas aeruginosa NOT DETECTED NOT DETECTED Final   Candida albicans NOT DETECTED NOT DETECTED Final   Candida glabrata NOT DETECTED NOT DETECTED  Final   Candida krusei NOT DETECTED NOT DETECTED Final   Candida parapsilosis NOT DETECTED NOT DETECTED Final   Candida tropicalis NOT DETECTED NOT DETECTED Final    Comment: Performed at [facility], [place] [address], [place], [place] [address]  MRSA PCR Screening     Status: None   Collection Time: 08/19/17  1:09 AM  Result Value Ref Range Status   MRSA by PCR NEGATIVE NEGATIVE Final    Comment:        The GeneXpert MRSA Assay (FDA approved for NASAL specimens only), is one component of a comprehensive MRSA colonization surveillance program. It is not intended to diagnose MRSA infection nor to guide or monitor treatment for MRSA infections. Performed at [facility], [address], [place], [place] [address]   Respiratory Panel by PCR     Status: None   Collection Time: 08/19/17  1:55 AM  Result Value Ref Range Status   Adenovirus NOT DETECTED NOT DETECTED Final   Coronavirus 229E NOT DETECTED NOT  DETECTED Final   Coronavirus HKU1 NOT DETECTED NOT DETECTED Final   Coronavirus NL63 NOT DETECTED NOT DETECTED Final   Coronavirus OC43 NOT DETECTED NOT DETECTED Final   Metapneumovirus NOT DETECTED NOT DETECTED Final   Rhinovirus / Enterovirus NOT DETECTED NOT DETECTED Final   Influenza A NOT DETECTED NOT DETECTED Final   Influenza B NOT DETECTED NOT DETECTED Final   Parainfluenza Virus 1 NOT DETECTED NOT DETECTED Final   Parainfluenza Virus 2 NOT DETECTED NOT DETECTED Final   Parainfluenza Virus 3 NOT DETECTED NOT DETECTED Final   Parainfluenza Virus 4 NOT DETECTED NOT DETECTED Final   Respiratory Syncytial Virus NOT DETECTED NOT DETECTED Final   Bordetella pertussis NOT DETECTED NOT DETECTED Final   Chlamydophila pneumoniae NOT DETECTED NOT DETECTED Final   Mycoplasma pneumoniae NOT DETECTED NOT DETECTED Final    Comment: Performed at [facility], [place] [address], [place], [place] [address]  Culture, respiratory (NON-Expectorated)     Status: None (Preliminary result)   Collection Time: 08/19/17  2:40 AM  Result Value Ref Range Status   Specimen Description   Final    TRACHEAL ASPIRATE Performed at [facility], [address], [place], [place] [address]    Special Requests   Final    NONE Performed at [facility], [address], [place], [place] [address]    Gram Stain   Final    ABUNDANT WBC PRESENT, PREDOMINANTLY PMN RARE SQUAMOUS EPITHELIAL CELLS PRESENT ABUNDANT GRAM POSITIVE COCCI IN PAIRS IN CLUSTERS FEW GRAM POSITIVE RODS FEW GRAM NEGATIVE RODS    Culture   Final    CULTURE REINCUBATED FOR BETTER GROWTH Performed at [facility], [place] [address] [place], [place] [address]    Report Status PENDING  Incomplete  Culture, blood (Routine X 2) w Reflex to ID Panel     Status: None (Preliminary result)   Collection Time: 08/20/17  7:51 AM  Result Value Ref Range Status   Specimen Description BLOOD LEFT HAND  Final   Special Requests   Final    BOTTLES  DRAWN AEROBIC AND ANAEROBIC Blood Culture adequate volume   Culture   Final    NO GROWTH < 24 HOURS Performed at [facility], [address] [place],  [address]    Report Status PENDING  Incomplete  Culture, blood (Routine X 2) w Reflex to ID Panel     Status: None (Preliminary result)   Collection Time:  08/20/17  7:52 AM  Result Value Ref Range Status   Specimen Description BLOOD LEFT WRIST  Final   Special Requests   Final    BOTTLES DRAWN AEROBIC AND ANAEROBIC Blood Culture adequate volume   Culture   Final    NO GROWTH < 24 HOURS Performed at [facility], [address] [place], [place] [address]    Report Status PENDING  Incomplete     Scheduled Meds: . allopurinol  100 mg Per Tube Daily  . amantadine  100 mg Per Tube BID  . apixaban  2.5 mg Per Tube BID  . chlorhexidine gluconate (MEDLINE KIT)  15 mL Mouth Rinse BID  . collagenase  1 application Topical Daily  . famotidine  20 mg Per Tube BID  . feeding supplement (PRO-STAT SUGAR FREE 64)  30 mL Per Tube Daily  . ipratropium-albuterol  3 mL Nebulization Q6H  . mouth rinse  15 mL Mouth Rinse QID  . metoprolol tartrate  2.5 mg Intravenous Q6H  . omega-3 acid ethyl esters  1 g Per Tube Daily  . polyethylene glycol  17 g Oral Daily  . sertraline  50 mg Per Tube Daily  . tamsulosin  0.4 mg Oral Daily   Continuous Infusions: . 0.45 % NaCl with KCl 20 mEq / L 75 mL/hr at 08/21/17 1137  . feeding supplement (VITAL AF 1.2 CAL) 1,000 mL (08/20/17 1530)  . meropenem (MERREM) IV Stopped (08/21/17 0701)  . propofol (DIPRIVAN) infusion 15 mcg/kg/min (08/21/17 1135)  . vancomycin Stopped (08/21/17 0945)    Procedures/Studies: Ct Abdomen Wo Contrast  Result Date: 08/03/2017 CLINICAL DATA:  Gastrostomy tube evaluation EXAM: CT ABDOMEN WITHOUT CONTRAST TECHNIQUE: Multidetector CT imaging of the abdomen was performed following the standard protocol without IV contrast. COMPARISON:  None. FINDINGS: Lower chest: Bibasilar  dependent consolidation left greater than right. Hepatobiliary: Unremarkable Pancreas: Unremarkable Spleen: Unremarkable Adrenals/Urinary Tract: Vascular calcifications are noted. Renal parenchyma is within normal limits. Adrenal glands are within normal limits. Stomach/Bowel: NG tube is in place. The stomach is positioned between the liver and transverse colon. No obvious mass in the colon. No evidence of small-bowel obstruction. Vascular/Lymphatic: Atherosclerotic calcifications of the aorta and iliac arteries. Other: No free fluid. Musculoskeletal: No vertebral compression deformity. Degenerative changes in the hips and lumbar spine. Additional imaging through the pelvis: Bladder is unremarkable. Prostate is within normal limits. IMPRESSION: There is favorable anatomy for gastrostomy tube placement. Bibasilar pulmonary consolidation left greater than right. Electronically Signed   By: Marybelle Killings M.D.   On: 08/03/2017 08:15   [name]  Result Date: 07/25/2017 CLINICAL DATA:  Ventilator support.  Multiple strokes. EXAM: MRI HEAD WITHOUT CONTRAST TECHNIQUE: Multiplanar, multiecho pulse sequences of the brain and surrounding structures were obtained without intravenous contrast. COMPARISON:  CT studies 06/29/2017.  MRI 04/25/2017. FINDINGS: Brain: Diffusion imaging shows a 4 mm acute infarction in the cerebral peduncle on the right. No other acute infarction. There are old small vessel cerebellar infarctions. There is old infarction within the left para median pons. There chronic small-vessel ischemic changes of the hemispheric white matter. No large vessel territory infarction. Chronic ventriculomegaly without change. Previously coiled circle of Willis aneurysm. I suspect that there is a thin subdural hematoma in the posterior fossa on the right. This could be artifact, given pronounced susceptibility artifact in the region, but I think is probably real. Maximal thickness 3 mm. No mass effect. The  patient has some layering blood products in the occipital horns  of both lateral ventricles. Vascular: Major vessels at the base of the brain show flow. Skull and upper cervical spine: It appears that the patient has had extensive cervical decompression and fusion, possibly with extension to the occiput. Sinuses/Orbits: Clear/normal Other: None IMPRESSION: Acute 4 mm infarction right cerebral peduncle. Thin subdural hematoma posterior fossa on the right, maximal thickness 3 mm. Small amount of blood dependent in the occipital horns of both lateral ventricles. Question if these hemorrhagic findings relate to the recent craniocervical surgical procedure. Chronic ischemic changes elsewhere throughout the brain. Chronic ventriculomegaly. Electronically Signed   By: Nelson Chimes M.D.   On: 07/25/2017 14:26   Ir Gastrostomy Tube Mod Sed  Result Date: 08/06/2017 INDICATION: Dysphagia EXAM: TWENTY FRENCH PULL-THROUGH GASTROSTOMY Date:  08/06/2017 08/06/2017 9:36 am Radiologist:  M. Daryll Brod, MD Guidance:  Ultrasound and fluoroscopic MEDICATIONS: 1 g Ancef; Antibiotics were administered within 1 hour of the procedure. Glucagon 0.5 mg IV ANESTHESIA/SEDATION: Versed 0.5 mg IV; Fentanyl 25 mcg IV Moderate Sedation Time:  15 The patient was continuously monitored during the procedure by the interventional radiology nurse under my direct supervision. CONTRAST:  10 cc Isovue-300-administered into the gastric lumen. FLUOROSCOPY TIME:  Fluoroscopy Time: 2 minutes 30 seconds (12 mGy). COMPLICATIONS: None immediate. PROCEDURE: Informed consent was obtained from the patient following explanation of the procedure, risks, benefits and alternatives. The patient understands, agrees and consents for the procedure. All questions were addressed. A time out was performed. Maximal barrier sterile technique utilized including caps, mask, sterile gowns, sterile gloves, large sterile drape, hand hygiene, and betadine prep. The left upper  quadrant was sterilely prepped and draped. An oral gastric catheter was inserted into the stomach under fluoroscopy. The existing nasogastric feeding tube was removed. Air was injected into the stomach for insufflation and visualization under fluoroscopy. The air distended stomach was confirmed beneath the anterior abdominal wall in the frontal and lateral projections. Under sterile conditions and local anesthesia, a 21 gauge trocar needle was utilized to access the stomach percutaneously beneath the left subcostal margin. Needle position was confirmed within the stomach under biplane fluoroscopy. Contrast injection confirmed position also. A single T tack was deployed for gastropexy. Over an Amplatz guide wire, a 9-French sheath was inserted into the stomach. A snare device was utilized to capture the oral gastric catheter. The snare device was pulled retrograde from the stomach up the esophagus and out the oropharynx. The 20-French pull-through gastrostomy was connected to the snare device and pulled antegrade through the oropharynx down the esophagus into the stomach and then through the percutaneous tract external to the patient. The gastrostomy was assembled externally. Contrast injection confirms position in the stomach. Images were obtained for documentation. The patient tolerated procedure well. No immediate complication. IMPRESSION: Fluoroscopic insertion of a 20-French "pull-through" gastrostomy. Electronically Signed   By: Jerilynn Mages.  Shick M.D.   On: 08/06/2017 09:52   US Venous Img Upper Uni Right  Result Date: 08/20/2017 CLINICAL DATA:  77 year old male with a history right upper extremity pain and edema EXAM: RIGHT UPPER EXTREMITY VENOUS DOPPLER ULTRASOUND TECHNIQUE: Gray-scale sonography with graded compression, as well as color Doppler and duplex ultrasound were performed to evaluate the upper extremity deep venous system from the level of the subclavian vein and including the jugular, axillary, basilic,  radial, ulnar and upper cephalic vein. Spectral Doppler was utilized to evaluate flow at rest and with distal augmentation maneuvers. COMPARISON:  None. FINDINGS: Contralateral Subclavian Vein: Respiratory phasicity is normal and symmetric with the symptomatic  side. No evidence of thrombus. Normal compressibility. Internal Jugular Vein: No evidence of thrombus. Normal compressibility, respiratory phasicity and response to augmentation. Subclavian Vein: No evidence of thrombus. Normal compressibility, respiratory phasicity and response to augmentation. Axillary Vein: No evidence of thrombus. Normal compressibility, respiratory phasicity and response to augmentation. Cephalic Vein: No evidence of thrombus. Normal compressibility, respiratory phasicity and response to augmentation. Basilic Vein: No evidence of thrombus. Normal compressibility, respiratory phasicity and response to augmentation. Brachial Veins: No evidence of thrombus. Normal compressibility, respiratory phasicity and response to augmentation. Radial Veins: No evidence of thrombus. Normal compressibility, respiratory phasicity and response to augmentation. Ulnar Veins: No evidence of thrombus. Normal compressibility, respiratory phasicity and response to augmentation. Other Findings:  None visualized. IMPRESSION: Sonographic survey of the right upper extremity negative for DVT. Signed, Dulcy Fanny. Earleen Newport, DO [facility] Electronically Signed   By: Corrie Mckusick D.O.   On: 08/20/2017 12:42   Dg Chest Port 1 View  Result Date: 08/21/2017 CLINICAL DATA:  Respiratory failure, CVA, sepsis EXAM: PORTABLE CHEST 1 VIEW COMPARISON:  Portable chest x-ray of August 20, 2017 FINDINGS: The lungs are adequately inflated. There is dense infiltrate in the left lower lobe. There remains obscuration of the left hemidiaphragm. The heart is normal in size. The pulmonary vascularity is not engorged. The  endotracheal tube tip projects at the inferior margin of the clavicular heads. There calcification in the wall of the aortic arch. IMPRESSION: Left lower lobe atelectasis and/or pneumonia more conspicuous today. Small left pleural effusion. Thoracic aortic atherosclerosis. Electronically Signed   By: Jevin  Martinique M.D.   On: 08/21/2017 06:54   Dg Chest Port 1 View  Result Date: 08/20/2017 CLINICAL DATA:  Acute respiratory failure EXAM: PORTABLE CHEST 1 VIEW COMPARISON:  08/19/2017 FINDINGS: Cardiac shadow is stable. Aortic calcifications are again seen. Tracheostomy tube is again seen and stable. Lungs are well aerated with the exception of persistent left retrocardiac density. The overall appearance however has improved when compare with the prior exam. IMPRESSION: Persistent but significantly improved left retrocardiac density. Electronically Signed   By: Inez Catalina M.D.   On: 08/20/2017 07:09   Dg Chest Port 1 View  Result Date: 08/19/2017 CLINICAL DATA:  Follow-up healthcare associated pneumonia, sepsis EXAM: PORTABLE CHEST 1 VIEW COMPARISON:  08/18/2017 FINDINGS: Moderate left pleural effusion, mildly increased. Associated left lower lobe opacity, favoring compressive atelectasis, pneumonia not excluded. Mild left suprahilar opacity, new, favoring asymmetric interstitial edema given the rapid change. Right lung is essentially clear. No pneumothorax. Tracheostomy in satisfactory position. The heart is top-normal in size. IMPRESSION: Mild left suprahilar opacity, new, favoring asymmetric interstitial edema given the rapid change. Moderate left pleural effusion, increased. Associated left lower lobe opacity, favoring compressive atelectasis, pneumonia not excluded. Electronically Signed   By: Julian Hy M.D.   On: 08/19/2017 07:14   Dg Chest Portable 1 View  Result Date: 08/18/2017 CLINICAL DATA:  Chronic ventilator dependent respiratory failure, presenting with fever, tachycardia, tachypnea and  hypoxia. Personal history of stroke. EXAM: PORTABLE CHEST 1 VIEW COMPARISON:  07/22/2017, 11/23/2016 and earlier. FINDINGS: Tracheostomy tube tip in satisfactory position below the thoracic inlet. Cardiac silhouette upper normal in size for AP portable technique, unchanged. Dense airspace consolidation involving the left lower lobe. Lungs otherwise clear. 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 IMPRESSION: 1. Dense left lower lobe pneumonia and/or atelectasis. 2. 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 Electronically Signed   By: Evangeline Dakin M.D.   On:  08/18/2017 22:15   US Abdomen Limited Ruq  Result Date: 08/19/2017 CLINICAL DATA:  Elevated LFTs EXAM: ULTRASOUND ABDOMEN LIMITED RIGHT UPPER QUADRANT COMPARISON:  CT abdomen/pelvis dated 08/02/2017 FINDINGS: Gallbladder: Layering gallbladder sludge. No gallbladder wall thickening or pericholecystic fluid. Common bile duct: Diameter: 3 mm Liver: No focal lesion identified. Within normal limits in parenchymal echogenicity. Portal vein is patent on color Doppler imaging with normal direction of blood flow towards the liver. IMPRESSION: Layering gallbladder sludge, without associated inflammatory changes. Electronically Signed   By: Julian Hy M.D.   On: 08/19/2017 11:27    Orson Eva, DO  [facility] Pager [phone]  If 7PM-7AM, please contact night-coverage [email or web address] Password [email or web address] 08/21/2017, 12:48 PM   LOS: 3 days

## 2017-08-21 NOTE — Consult Note (Signed)
Consultation Note Date: 08/21/2017   Patient Name: Richard Mcpherson  DOB: 27-May-1941  MRN: [id number]  Age / Sex: 77 y.o., male  PCP: Richard Blitz, MD Referring Physician: Orson Eva, MD  Reason for Consultation: Establishing goals of care  HPI/Patient Profile: 77 y.o. 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  He was admitted on 08/18/2017 with fever the day after he transferred from [facility] to [facility]F.  Initial workup revealed sepsis secondary to bacteremia and possible aspiration pneumonia.  At SNF he was on trach collar at this time he is requiring ventilator support for respiratory failure.  Clinical Assessment and Goals of Care:  I have reviewed medical records including EPIC notes, labs and imaging, received report from the care team, assessed the patient and then met at the bedside along with wife [place] Mc[place]son), daughter Richard Mcpherson), and niece [place] Mc[place]son - DM coordinator with [place])  to discuss diagnosis prognosis, [place], EOL wishes, disposition and options.  I introduced Palliative Medicine as specialized medical care for people living with serious illness. It focuses on providing relief from the symptoms and stress of a serious illness. The goal is to improve quality of life for both the patient and the family.  We discussed a brief life review of the patient. He was raised in this area and is from [place].  He has been married to [place] Mc[place]son for 50 years.  He has 1 daughter who currently lives in [place].  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  Richard Mcpherson describes him as very strong and stubborn - practical and pessimistic.  He was hard working and active.  Even after his cerebral aneurysm in July he did rehab and was back on his feet working around the house planning to drive the fuel truck once again.    With regard to his religious beliefs - Richard Mcpherson believes he has faith but states he was a private man and they never talked about it.    As we talked thru Richard Mcpherson's illness vs his personality - Richard Mcpherson was tearful and admitted "he would not want to be like this".  She discussed a life insurance policy that she wanted him to cash in - but he refused telling her that it was important for her to have that money when he died.  Richard Mc[place]son and [place]son completed advanced directives last summer.  Per Richard Mcpherson those directives instructed the doctors to "take all measures".    We discussed LTAC and SNF.  Richard Mc[place]son was very relieved when Richard Mc[place] was able to leave [place] and come to [place].  She was hopeful that he would start receiving physical therapy as he has basically been immobile since 12/14 when she found him in the drive way.  I asked Richard Mcpherson what she was hopeful for.  She wants [name] to be whole again - to walk and maybe even to drive his truck again.    I asked the question of Richard Mc[place]son - who  indicated that she wanted him better, but she had concerns that he was not going to get better.    As far as functional and nutritional status he was able to walk with a cane on 12/14 prior to his stroke and his cervical surgery.  Richard Mc[place]son described the very sudden cardiac arrest after surgery - "the doctor said it was caused by his brain rather than his heart".  He has not talked or moved in a meaningful way since his surgery.  We discussed aspiration pneumonia as being a terminal condition.  We discussed recurrent infections and his sacral wound.  At this point, Richard Mcpherson is very hopeful for improvement.  We discussed praying for the best but preparing for the worst (as Richard Mcpherson would do).  Hard Choices booklet left for review. The family was encouraged to call with questions or concerns.    Primary Decision Maker:  NEXT OF KIN wife, Richard [place]son    SUMMARY OF RECOMMENDATIONS    PMT will continue to follow to discuss goals of care and support the  family.  Code Status/Advance Care Planning:  Full   Psycho-social/Spiritual:   Desire for further Chaplaincy support: yes, appreciate Chaplain's support.  Prognosis:  Dependent on goals of care. While he is at high risk of an acute event that may take his life -   If the patient remains full aggressive care he may remain in this state for months.  If he is made comfort measures only he will pass in hours to days.  Discharge Planning: To Be Determined SNF vs LTAC - pending whether or not he is on trach collar.      Primary Diagnoses: Present on Admission: . Hyperlipidemia . Essential hypertension . Stroke (cerebrum) ([place]) . CAD, NATIVE VESSEL   I have reviewed the medical record, interviewed the patient and family, and examined the patient. The following aspects are pertinent.  Past Medical History:  Diagnosis Date  . Atrial premature beats   . Blood in stool   . Cerebral aneurysm   . Coronary atherosclerosis of native coronary artery   . Embolism ([place])    History of right lower extremity distal embolism   . Gout   . Hyperglycemia   . Hyperlipidemia, mixed   . Osteoarthritis   . Plantar fasciitis   . Sinusitis   . Stroke ([place])   . Subarachnoid hemorrhage ([place])   . Unspecified essential hypertension    Social History   Socioeconomic History  . Marital status: Married    Spouse name: None  . Number of children: None  . Years of education: None  . Highest education level: None  Social Needs  . Financial resource strain: None  . Food insecurity - worry: None  . Food insecurity - inability: None  . Transportation needs - medical: None  . Transportation needs - non-medical: None  Occupational History  . Occupation: retired  Tobacco Use  . Smoking status: Former Smoker    Packs/day: 1.00    Years: 40.00    Pack years: 40.00  . Smokeless tobacco: Current User    Types: Snuff  Substance and Sexual Activity  . Alcohol use: Yes    Comment: 1-2 beers every  other day   . Drug use: No  . Sexual activity: None  Other Topics Concern  . None  Social History Narrative  . None   Family History  Problem Relation Age of Onset  . Cancer Mother    Scheduled Meds: . allopurinol  100 mg  Per Tube Daily  . amantadine  100 mg Per Tube BID  . apixaban  2.5 mg Per Tube BID  . chlorhexidine gluconate (MEDLINE KIT)  15 mL Mouth Rinse BID  . collagenase  1 application Topical Daily  . famotidine  20 mg Per Tube BID  . feeding supplement (PRO-STAT SUGAR FREE 64)  30 mL Per Tube Daily  . ipratropium-albuterol  3 mL Nebulization Q6H  . mouth rinse  15 mL Mouth Rinse QID  . metoprolol tartrate  2.5 mg Intravenous Q6H  . omega-3 acid ethyl esters  1 g Per Tube Daily  . polyethylene glycol  17 g Oral Daily  . sertraline  50 mg Per Tube Daily  . tamsulosin  0.4 mg Oral Daily   Continuous Infusions: . 0.45 % NaCl with KCl 20 mEq / L 75 mL/hr at 08/19/17 2339  . feeding supplement (VITAL AF 1.2 CAL) 1,000 mL (08/20/17 1530)  . meropenem (MERREM) IV Stopped (08/21/17 0701)  . propofol (DIPRIVAN) infusion 30 mcg/kg/min (08/21/17 0152)  . vancomycin 1,000 mg (08/21/17 0842)   PRN Meds:.acetaminophen **OR** acetaminophen, midazolam, ondansetron **OR** ondansetron (ZOFRAN) IV Allergies  Allergen Reactions  . Codeine Nausea Only and Other (See Comments)  . Crestor [Rosuvastatin Calcium] Other (See Comments)    bodyache  . Lipitor [Atorvastatin] Other (See Comments)    Body aches  . Statins   . Allopurinol Rash   Review of Systems patient unable to speak  Physical Exam  Well developed chronically ill appearing male, sedated CV rrr no frank m/r/g Resp  On vent per trach.  No distress Abdomen soft NT, ND Extremities - upper extremities swollen, lower extremities in prevlon boots.   Vital Signs: BP 107/61   Pulse 93   Temp 98.8 F (37.1 C)   Resp 20   Ht 6' (1.829 m)   Wt 81.8 kg (180 lb 5.4 oz)   SpO2 96%   BMI 24.46 kg/m  Pain Assessment:  No/denies pain   Pain Score: 0-No pain   SpO2: SpO2: 96 % O2 Device:SpO2: 96 % O2 Flow Rate: .O2 Flow Rate (L/min): 15 L/min  IO: Intake/output summary:   Intake/Output Summary (Last 24 hours) at 08/21/2017 0933 Last data filed at 08/21/2017 3151 Gross per 24 hour  Intake 3814.43 ml  Output 450 ml  Net 3364.43 ml    LBM: Last BM Date: 08/19/17 Baseline Weight: Weight: 83.4 kg (183 lb 14.4 oz) Most recent weight: Weight: 81.8 kg (180 lb 5.4 oz)     Palliative Assessment/Data: 10%     Time In: 9:00 Time Out: 10:10 Time Total: 70 min. Greater than 50%  of this time was spent counseling and coordinating care related to the above assessment and plan.  Signed by: Florentina Jenny, PA-C Palliative Medicine Pager: [phone]  Please contact Palliative Medicine Team phone at [phone] for questions and concerns.  For individual provider: See Shea Evans

## 2017-08-21 NOTE — Progress Notes (Signed)
Taken patient down for ct scan of chest. No difficulties or problems.

## 2017-08-22 DIAGNOSIS — J690 Pneumonitis due to inhalation of food and vomit: Secondary | ICD-10-CM

## 2017-08-22 DIAGNOSIS — Z515 Encounter for palliative care: Secondary | ICD-10-CM

## 2017-08-22 DIAGNOSIS — A419 Sepsis, unspecified organism: Secondary | ICD-10-CM

## 2017-08-22 DIAGNOSIS — J9601 Acute respiratory failure with hypoxia: Secondary | ICD-10-CM

## 2017-08-22 DIAGNOSIS — J9621 Acute and chronic respiratory failure with hypoxia: Secondary | ICD-10-CM

## 2017-08-22 LAB — BASIC METABOLIC PANEL
ANION GAP: 8 (ref 5–15)
BUN: 27 mg/dL — ABNORMAL HIGH (ref 6–20)
CALCIUM: 8.4 mg/dL — AB (ref 8.9–10.3)
CO2: 24 mmol/L (ref 22–32)
Chloride: 107 mmol/L (ref 101–111)
Glucose, Bld: 108 mg/dL — ABNORMAL HIGH (ref 65–99)
Potassium: 3.2 mmol/L — ABNORMAL LOW (ref 3.5–5.1)
Sodium: 139 mmol/L (ref 135–145)

## 2017-08-22 LAB — CULTURE, BLOOD (ROUTINE X 2)
Special Requests: ADEQUATE
Special Requests: ADEQUATE

## 2017-08-22 LAB — BLOOD GAS, ARTERIAL
ACID-BASE EXCESS: 2.4 mmol/L — AB (ref 0.0–2.0)
Bicarbonate: 26.7 mmol/L (ref 20.0–28.0)
Drawn by: 22223
FIO2: 40
LHR: 20 {breaths}/min
O2 SAT: 98 %
PCO2 ART: 34.9 mmHg (ref 32.0–48.0)
PEEP: 5 cmH2O
PH ART: 7.48 — AB (ref 7.350–7.450)
PO2 ART: 104 mmHg (ref 83.0–108.0)
VT: 620 mL

## 2017-08-22 LAB — CBC
HCT: 27.5 % — ABNORMAL LOW (ref 39.0–52.0)
HEMOGLOBIN: 8.2 g/dL — AB (ref 13.0–17.0)
MCH: 25.9 pg — AB (ref 26.0–34.0)
MCHC: 29.8 g/dL — ABNORMAL LOW (ref 30.0–36.0)
MCV: 86.8 fL (ref 78.0–100.0)
PLATELETS: 201 10*3/uL (ref 150–400)
RBC: 3.17 MIL/uL — ABNORMAL LOW (ref 4.22–5.81)
RDW: 16.2 % — AB (ref 11.5–15.5)
WBC: 9.5 10*3/uL (ref 4.0–10.5)

## 2017-08-22 LAB — TRIGLYCERIDES: TRIGLYCERIDES: 144 mg/dL (ref <150)

## 2017-08-22 NOTE — Progress Notes (Signed)
Subjective: He remains on the ventilator but he is down to 40% oxygen now.  He looks more comfortable.  He had CT of the chest without contrast yesterday that I have reviewed but has not been officially read yet  Objective: Vital signs in last 24 hours: Temp:  [97.4 F (36.3 C)-101.5 F (38.6 C)] 97.4 F (36.3 C) (02/06 0737) Pulse Rate:  [77-98] 83 (02/06 0737) Resp:  [19-23] 20 (02/06 0737) BP: (97-116)/(55-64) 110/60 (02/06 0500) SpO2:  [93 %-98 %] 96 % (02/06 0737) FiO2 (%):  [40 %] 40 % (02/06 0728) Weight:  [81 kg (178 lb 9.2 oz)] 81 kg (178 lb 9.2 oz) (02/06 0500) Weight change: -0.8 kg (-12.2 oz) Last BM Date: 08/19/17  Intake/Output from previous day: 02/05 0701 - 02/06 0700 In: 3968.9 [I.V.:2128; NG/GT:1120.8; IV Piggyback:600] Out: 1125 [XLKGM:0102]  PHYSICAL EXAM General appearance: He is awake and follows with his eyes.  He still has ventilator and tracheostomy Resp: rhonchi bilaterally Cardio: regular rate and rhythm, S1, S2 normal, no murmur, click, rub or gallop GI: soft, non-tender; bowel sounds normal; no masses,  no organomegaly Extremities: extremities normal, atraumatic, no cyanosis or edema Skin warm and dry  Lab Results:  Results for orders placed or performed during the hospital encounter of 08/18/17 (from the past 48 hour(s))  Procalcitonin     Status: None   Collection Time: 08/21/17  4:09 AM  Result Value Ref Range   Procalcitonin 0.58 ng/mL    Comment:        Interpretation: PCT > 0.5 ng/mL and <= 2 ng/mL: Systemic infection (sepsis) is possible, but other conditions are known to elevate PCT as well. (NOTE)       Sepsis PCT Algorithm           Lower Respiratory Tract                                      Infection PCT Algorithm    ----------------------------     ----------------------------         PCT < 0.25 ng/mL                PCT < 0.10 ng/mL         Strongly encourage             Strongly discourage   discontinuation of antibiotics     initiation of antibiotics    ----------------------------     -----------------------------       PCT 0.25 - 0.50 ng/mL            PCT 0.10 - 0.25 ng/mL               OR       >80% decrease in PCT            Discourage initiation of                                            antibiotics      Encourage discontinuation           of antibiotics    ----------------------------     -----------------------------         PCT >= 0.50 ng/mL              PCT 0.26 -  0.50 ng/mL                AND       <80% decrease in PCT             Encourage initiation of                                             antibiotics       Encourage continuation           of antibiotics    ----------------------------     -----------------------------        PCT >= 0.50 ng/mL                  PCT > 0.50 ng/mL               AND         increase in PCT                  Strongly encourage                                      initiation of antibiotics    Strongly encourage escalation           of antibiotics                                     -----------------------------                                           PCT <= 0.25 ng/mL                                                 OR                                        > 80% decrease in PCT                                     Discontinue / Do not initiate                                             antibiotics Performed at [facility], [address] [place],  [address]   Blood gas, arterial     Status: Abnormal   Collection Time: 08/21/17  5:25 AM  Result Value Ref Range   FIO2 40.00    Delivery systems VENTILATOR    Mode PRESSURE REGULATED VOLUME CONTROL    VT 620 mL   LHR 20 resp/min   Peep/cpap 5.0 cm H20   pH, Arterial 7.455 (H) 7.350 - 7.450   pCO2 arterial 36.4 32.0 -  48.0 mmHg   pO2, Arterial 94.8 83.0 - 108.0 mmHg   Bicarbonate 26.0 20.0 - 28.0 mmol/L   Acid-Base Excess 1.7 0.0 - 2.0 mmol/L   O2 Saturation 97.4 %   Collection site LEFT  RADIAL    Drawn by [id number]    Sample type ARTERIAL    Allens test (pass/fail) PASS PASS    Comment: Performed at [facility], [address], [place], [place] [address]  Basic metabolic panel     Status: Abnormal   Collection Time: 08/21/17 12:58 PM  Result Value Ref Range   Sodium 141 135 - 145 mmol/L   Potassium 3.4 (L) 3.5 - 5.1 mmol/L   Chloride 110 101 - 111 mmol/L   CO2 23 22 - 32 mmol/L   Glucose, Bld 112 (H) 65 - 99 mg/dL   BUN 31 (H) 6 - 20 mg/dL   Creatinine, Ser 0.30 (L) 0.61 - 1.24 mg/dL   Calcium 8.3 (L) 8.9 - 10.3 mg/dL   GFR calc non Af Amer >60 >60 mL/min   GFR calc Af Amer >60 >60 mL/min    Comment: (NOTE) The eGFR has been calculated using the CKD EPI equation. This calculation has not been validated in all clinical situations. eGFR's persistently <60 mL/min signify possible Chronic Kidney Disease.    Anion gap 8 5 - 15    Comment: Performed at [facility], [address], [place], [place] [address]  CBC     Status: Abnormal   Collection Time: 08/21/17 12:58 PM  Result Value Ref Range   WBC 12.5 (H) 4.0 - 10.5 K/uL   RBC 3.28 (L) 4.22 - 5.81 MIL/uL   Hemoglobin 8.4 (L) 13.0 - 17.0 g/dL   HCT 28.8 (L) 39.0 - 52.0 %   MCV 87.8 78.0 - 100.0 fL   MCH 25.6 (L) 26.0 - 34.0 pg   MCHC 29.2 (L) 30.0 - 36.0 g/dL   RDW 16.4 (H) 11.5 - 15.5 %   Platelets 224 150 - 400 K/uL    Comment: Performed at [facility], [address] [place], [place] [address]  Hepatic function panel     Status: Abnormal   Collection Time: 08/21/17 12:58 PM  Result Value Ref Range   Total Protein 5.6 (L) 6.5 - 8.1 g/dL   Albumin 1.6 (L) 3.5 - 5.0 g/dL   AST 52 (H) 15 - 41 U/L   ALT 56 17 - 63 U/L   Alkaline Phosphatase 279 (H) 38 - 126 U/L   Total Bilirubin 0.9 0.3 - 1.2 mg/dL   Bilirubin, Direct 0.5 0.1 - 0.5 mg/dL   Indirect Bilirubin 0.4 0.3 - 0.9 mg/dL    Comment: Performed at [facility], [address] [place], [place] [address]  Glucose, capillary     Status: Abnormal    Collection Time: 08/21/17  4:08 PM  Result Value Ref Range   Glucose-Capillary 120 (H) 65 - 99 mg/dL   Comment 1 Notify RN    Comment 2 Document in Chart   Blood gas, arterial     Status: Abnormal   Collection Time: 08/22/17  4:55 AM  Result Value Ref Range   FIO2 40.00    Delivery systems VENTILATOR    Mode PRESSURE REGULATED VOLUME CONTROL    VT 620 mL   LHR 20 resp/min   Peep/cpap 5.0 cm H20   pH, Arterial 7.480 (H) 7.350 - 7.450   pCO2 arterial 34.9 32.0 - 48.0 mmHg   pO2, Arterial 104.0 83.0 -  108.0 mmHg   Bicarbonate 26.7 20.0 - 28.0 mmol/L   Acid-Base Excess 2.4 (H) 0.0 - 2.0 mmol/L   O2 Saturation 98.0 %   Collection site LEFT RADIAL    Drawn by [id number]    Sample type ARTERIAL    Allens test (pass/fail) PASS PASS    Comment: Performed at [facility], [address] [place], [place] [address]  Basic metabolic panel     Status: Abnormal   Collection Time: 08/22/17  5:31 AM  Result Value Ref Range   Sodium 139 135 - 145 mmol/L   Potassium 3.2 (L) 3.5 - 5.1 mmol/L   Chloride 107 101 - 111 mmol/L   CO2 24 22 - 32 mmol/L   Glucose, Bld 108 (H) 65 - 99 mg/dL   BUN 27 (H) 6 - 20 mg/dL   Creatinine, Ser <0.30 (L) 0.61 - 1.24 mg/dL   Calcium 8.4 (L) 8.9 - 10.3 mg/dL   GFR calc non Af Amer NOT CALCULATED >60 mL/min   GFR calc Af Amer NOT CALCULATED >60 mL/min    Comment: (NOTE) The eGFR has been calculated using the CKD EPI equation. This calculation has not been validated in all clinical situations. eGFR's persistently <60 mL/min signify possible Chronic Kidney Disease.    Anion gap 8 5 - 15    Comment: Performed at [facility], [address], [place], [place] [address]  CBC     Status: Abnormal   Collection Time: 08/22/17  5:31 AM  Result Value Ref Range   WBC 9.5 4.0 - 10.5 K/uL   RBC 3.17 (L) 4.22 - 5.81 MIL/uL   Hemoglobin 8.2 (L) 13.0 - 17.0 g/dL   HCT 27.5 (L) 39.0 - 52.0 %   MCV 86.8 78.0 - 100.0 fL   MCH 25.9 (L) 26.0 - 34.0 pg   MCHC 29.8 (L) 30.0 - 36.0  g/dL   RDW 16.2 (H) 11.5 - 15.5 %   Platelets 201 150 - 400 K/uL    Comment: Performed at [facility], [address], [place], [place] [address]    ABGS Recent Labs    08/22/17 0455  PHART 7.480*  PO2ART 104.0  HCO3 26.7   CULTURES Recent Results (from the past 240 hour(s))  Culture, blood (Routine x 2)     Status: Abnormal (Preliminary result)   Collection Time: 08/18/17  9:42 PM  Result Value Ref Range Status   Specimen Description   Final    LEFT ANTECUBITAL Performed at [facility], [address] [place], [place] [address]    Special Requests   Final    BOTTLES DRAWN AEROBIC AND ANAEROBIC Blood Culture adequate volume Performed at [facility], [address], [place], [place] [address]    Culture  Setup Time   Final    GRAM POSITIVE COCCI Gram Stain Report Called to,Read Back By and Verified With: GAMMONS,S. AT 0203 ON 08/20/2017 BY EVA Performed at [facility]    Culture (A)  Final    STAPHYLOCOCCUS SPECIES (COAGULASE NEGATIVE) SUSCEPTIBILITIES TO FOLLOW Performed at [facility], [place] [address], [place], [place] [address]    Report Status PENDING  Incomplete  Blood Culture ID Panel (Reflexed)     Status: Abnormal   Collection Time: 08/18/17  9:42 PM  Result Value Ref Range Status   Enterococcus species NOT DETECTED NOT DETECTED Final   Listeria monocytogenes NOT DETECTED NOT DETECTED Final   Staphylococcus species DETECTED (A) NOT DETECTED Final    Comment:  Methicillin (oxacillin) resistant coagulase negative staphylococcus. Possible blood culture contaminant (unless isolated from more than one blood culture draw or clinical case suggests pathogenicity). No antibiotic treatment is indicated for blood  culture contaminants. L. Seay Pharm.D. 7:45 08/20/17 (wilsonm)    Staphylococcus aureus NOT DETECTED NOT DETECTED Final   Methicillin resistance DETECTED (A) NOT DETECTED Final    Comment: CRITICAL RESULT CALLED TO, READ BACK BY AND VERIFIED  WITH: L. Seay Pharm.D. 7:45 08/20/17 (wilsonm)    Streptococcus species NOT DETECTED NOT DETECTED Final   Streptococcus agalactiae NOT DETECTED NOT DETECTED Final   Streptococcus pneumoniae NOT DETECTED NOT DETECTED Final   Streptococcus pyogenes NOT DETECTED NOT DETECTED Final   Acinetobacter baumannii NOT DETECTED NOT DETECTED Final   Enterobacteriaceae species NOT DETECTED NOT DETECTED Final   Enterobacter cloacae complex NOT DETECTED NOT DETECTED Final   Escherichia coli NOT DETECTED NOT DETECTED Final   Klebsiella oxytoca NOT DETECTED NOT DETECTED Final   Klebsiella pneumoniae NOT DETECTED NOT DETECTED Final   Proteus species NOT DETECTED NOT DETECTED Final   Serratia marcescens NOT DETECTED NOT DETECTED Final   Haemophilus influenzae NOT DETECTED NOT DETECTED Final   Neisseria meningitidis NOT DETECTED NOT DETECTED Final   Pseudomonas aeruginosa NOT DETECTED NOT DETECTED Final   Candida albicans NOT DETECTED NOT DETECTED Final   Candida glabrata NOT DETECTED NOT DETECTED Final   Candida krusei NOT DETECTED NOT DETECTED Final   Candida parapsilosis NOT DETECTED NOT DETECTED Final   Candida tropicalis NOT DETECTED NOT DETECTED Final    Comment: Performed at [facility], [place] [address] [place], [place] [address]  Urine culture     Status: None   Collection Time: 08/18/17  9:46 PM  Result Value Ref Range Status   Specimen Description   Final    URINE, CLEAN CATCH Performed at [facility], [address], [place], [place] [address]    Special Requests   Final    NONE Performed at [facility], [address] [place], [place] [address]    Culture   Final    NO GROWTH Performed at [facility], [place] [address] [place], [place] [address]    Report Status 08/20/2017 FINAL  Final  Culture, blood (Routine x 2)     Status: Abnormal (Preliminary result)   Collection Time: 08/18/17  9:51 PM  Result Value Ref Range Status   Specimen Description   Final    BLOOD LEFT  FOREARM Performed at [facility], [address] [place], [place] [address]    Special Requests   Final    BOTTLES DRAWN AEROBIC ONLY Blood Culture adequate volume Performed at [facility], [address] [place], [place] [address]    Culture  Setup Time   Final    GRAM POSITIVE COCCI Gram Stain Report Called to,Read Back By and Verified With: HOWARD,C. AT 2138 ON 08/19/2017 BY EVA AEROBIC BOTTLE ONLY Performed at [facility]    Culture (A)  Final    STAPHYLOCOCCUS SPECIES (COAGULASE NEGATIVE) SUSCEPTIBILITIES TO FOLLOW Performed at [facility], [place] [address], [place], [place] [address]    Report Status PENDING  Incomplete  Blood Culture ID Panel (Reflexed)     Status: Abnormal   Collection Time: 08/18/17  9:51 PM  Result Value Ref Range Status   Enterococcus species NOT DETECTED NOT DETECTED Final   Listeria monocytogenes NOT DETECTED NOT DETECTED Final   Staphylococcus species DETECTED (A) NOT DETECTED Final  Comment: Methicillin (oxacillin) resistant coagulase negative staphylococcus. Possible blood culture contaminant (unless isolated from more than one blood culture draw or clinical case suggests pathogenicity). No antibiotic treatment is indicated for blood  culture contaminants. CRITICAL RESULT CALLED TO, READ BACK BY AND VERIFIED WITH: TO SGANNONS(RN) BY TCLEVELAND 08/20/17 AT 5:44AM    Staphylococcus aureus NOT DETECTED NOT DETECTED Final   Methicillin resistance DETECTED (A) NOT DETECTED Final    Comment: CRITICAL RESULT CALLED TO, READ BACK BY AND VERIFIED WITH: TO SGANNONS(RN) BY TCLEVELAND 08/20/17 AT 5:44AM    Streptococcus species NOT DETECTED NOT DETECTED Final   Streptococcus agalactiae NOT DETECTED NOT DETECTED Final   Streptococcus pneumoniae NOT DETECTED NOT DETECTED Final   Streptococcus pyogenes NOT DETECTED NOT DETECTED Final   Acinetobacter baumannii NOT DETECTED NOT DETECTED Final   Enterobacteriaceae species NOT DETECTED NOT DETECTED  Final   Enterobacter cloacae complex NOT DETECTED NOT DETECTED Final   Escherichia coli NOT DETECTED NOT DETECTED Final   Klebsiella oxytoca NOT DETECTED NOT DETECTED Final   Klebsiella pneumoniae NOT DETECTED NOT DETECTED Final   Proteus species NOT DETECTED NOT DETECTED Final   Serratia marcescens NOT DETECTED NOT DETECTED Final   Haemophilus influenzae NOT DETECTED NOT DETECTED Final   Neisseria meningitidis NOT DETECTED NOT DETECTED Final   Pseudomonas aeruginosa NOT DETECTED NOT DETECTED Final   Candida albicans NOT DETECTED NOT DETECTED Final   Candida glabrata NOT DETECTED NOT DETECTED Final   Candida krusei NOT DETECTED NOT DETECTED Final   Candida parapsilosis NOT DETECTED NOT DETECTED Final   Candida tropicalis NOT DETECTED NOT DETECTED Final    Comment: Performed at [facility], [place], [place], [place] [address]  MRSA PCR Screening     Status: None   Collection Time: 08/19/17  1:09 AM  Result Value Ref Range Status   MRSA by PCR NEGATIVE NEGATIVE Final    Comment:        The GeneXpert MRSA Assay (FDA approved for NASAL specimens only), is one component of a comprehensive MRSA colonization surveillance program. It is not intended to diagnose MRSA infection nor to guide or monitor treatment for MRSA infections. Performed at [facility], [address], [place], [place] [address]   Respiratory Panel by PCR     Status: None   Collection Time: 08/19/17  1:55 AM  Result Value Ref Range Status   Adenovirus NOT DETECTED NOT DETECTED Final   Coronavirus 229E NOT DETECTED NOT DETECTED Final   Coronavirus HKU1 NOT DETECTED NOT DETECTED Final   Coronavirus NL63 NOT DETECTED NOT DETECTED Final   Coronavirus OC43 NOT DETECTED NOT DETECTED Final   Metapneumovirus NOT DETECTED NOT DETECTED Final   Rhinovirus / Enterovirus NOT DETECTED NOT DETECTED Final   Influenza A NOT DETECTED NOT DETECTED Final   Influenza B NOT DETECTED NOT DETECTED Final   Parainfluenza  Virus 1 NOT DETECTED NOT DETECTED Final   Parainfluenza Virus 2 NOT DETECTED NOT DETECTED Final   Parainfluenza Virus 3 NOT DETECTED NOT DETECTED Final   Parainfluenza Virus 4 NOT DETECTED NOT DETECTED Final   Respiratory Syncytial Virus NOT DETECTED NOT DETECTED Final   Bordetella pertussis NOT DETECTED NOT DETECTED Final   Chlamydophila pneumoniae NOT DETECTED NOT DETECTED Final   Mycoplasma pneumoniae NOT DETECTED NOT DETECTED Final    Comment: Performed at [facility], [place] [address], [place], [place] [address]  Culture, respiratory (NON-Expectorated)     Status: None   Collection Time: 08/19/17  2:40 AM  Result Value Ref Range Status   Specimen Description   Final    TRACHEAL ASPIRATE Performed at [facility], [address], [place], [place] [address]    Special Requests   Final    NONE Performed at [facility], [address], [place], [place] [address]    Gram Stain   Final    ABUNDANT WBC PRESENT, PREDOMINANTLY PMN RARE SQUAMOUS EPITHELIAL CELLS PRESENT ABUNDANT GRAM POSITIVE COCCI IN PAIRS IN CLUSTERS FEW GRAM POSITIVE RODS FEW GRAM NEGATIVE RODS    Culture   Final    Consistent with normal respiratory flora. Performed at [facility], [place] [address], [place], [place] [address]    Report Status 08/21/2017 FINAL  Final  Culture, blood (Routine X 2) w Reflex to ID Panel     Status: None (Preliminary result)   Collection Time: 08/20/17  7:51 AM  Result Value Ref Range Status   Specimen Description BLOOD LEFT HAND  Final   Special Requests   Final    BOTTLES DRAWN AEROBIC AND ANAEROBIC Blood Culture adequate volume   Culture   Final    NO GROWTH 2 DAYS Performed at [facility], [address], [place], [place] [address]    Report Status PENDING  Incomplete  Culture, blood (Routine X 2) w Reflex to ID Panel     Status: None (Preliminary result)   Collection Time: 08/20/17  7:52 AM  Result Value Ref Range Status   Specimen Description BLOOD LEFT WRIST   Final   Special Requests   Final    BOTTLES DRAWN AEROBIC AND ANAEROBIC Blood Culture adequate volume   Culture   Final    NO GROWTH 2 DAYS Performed at [facility], [address] [place], [place] [address]    Report Status PENDING  Incomplete   Studies/Results: US Venous Img Upper Uni Right  Result Date: 08/20/2017 CLINICAL DATA:  77 year old male with a history right upper extremity pain and edema EXAM: RIGHT UPPER EXTREMITY VENOUS DOPPLER ULTRASOUND TECHNIQUE: Gray-scale sonography with graded compression, as well as color Doppler and duplex ultrasound were performed to evaluate the upper extremity deep venous system from the level of the subclavian vein and including the jugular, axillary, basilic, radial, ulnar and upper cephalic vein. Spectral Doppler was utilized to evaluate flow at rest and with distal augmentation maneuvers. COMPARISON:  None. FINDINGS: Contralateral Subclavian Vein: Respiratory phasicity is normal and symmetric with the symptomatic side. No evidence of thrombus. Normal compressibility. Internal Jugular Vein: No evidence of thrombus. Normal compressibility, respiratory phasicity and response to augmentation. Subclavian Vein: No evidence of thrombus. Normal compressibility, respiratory phasicity and response to augmentation. Axillary Vein: No evidence of thrombus. Normal compressibility, respiratory phasicity and response to augmentation. Cephalic Vein: No evidence of thrombus. Normal compressibility, respiratory phasicity and response to augmentation. Basilic Vein: No evidence of thrombus. Normal compressibility, respiratory phasicity and response to augmentation. Brachial Veins: No evidence of thrombus. Normal compressibility, respiratory phasicity and response to augmentation. Radial Veins: No evidence of thrombus. Normal compressibility, respiratory phasicity and response to augmentation. Ulnar Veins: No evidence of thrombus. Normal compressibility, respiratory phasicity and  response to augmentation. Other Findings:  None visualized. IMPRESSION: Sonographic survey of the right upper extremity negative for DVT. Signed, Dulcy Fanny. Earleen Newport, DO [facility] Radiology Electronically Signed   By: Corrie Mckusick D.O.   On: 08/20/2017 12:42   Dg Chest Port 1 View  Result Date: 08/21/2017 CLINICAL DATA:  Respiratory  failure, CVA, sepsis EXAM: PORTABLE CHEST 1 VIEW COMPARISON:  Portable chest x-ray of August 20, 2017 FINDINGS: The lungs are adequately inflated. There is dense infiltrate in the left lower lobe. There remains obscuration of the left hemidiaphragm. The heart is normal in size. The pulmonary vascularity is not engorged. The endotracheal tube tip projects at the inferior margin of the clavicular heads. There calcification in the wall of the aortic arch. IMPRESSION: Left lower lobe atelectasis and/or pneumonia more conspicuous today. Small left pleural effusion. Thoracic aortic atherosclerosis. Electronically Signed   By: Okechukwu  [place] M.D.   On: 08/21/2017 06:54    Medications:  Prior to Admission:  Medications Prior to Admission  Medication Sig Dispense Refill Last Dose  . allopurinol (ZYLOPRIM) 100 MG tablet Place 100 mg into feeding tube 3 (three) times a week. Every Tuesday, Thursday, Sunday   08/16/2017 at Unknown time  . amantadine (SYMMETREL) 100 MG capsule Place 100 mg into feeding tube 2 (two) times daily.   08/18/2017 at Unknown time  . apixaban (ELIQUIS) 2.5 MG TABS tablet Place 2.5 mg into feeding tube 2 (two) times daily.   08/18/2017 at 1700  . collagenase (SANTYL) ointment Apply 1 application topically daily. Right buttocks area   08/18/2017 at Unknown time  . famotidine (PEPCID) 20 MG tablet Place 20 mg into feeding tube 2 (two) times daily.   08/18/2017 at Unknown time  . HYDROcodone-acetaminophen (NORCO/VICODIN) 5-325 MG tablet Place 1 tablet into feeding tube every 6 (six) hours as needed for moderate pain.    unknown  . Melatonin 3 MG TABS Place 3 mg into feeding tube at bedtime.   08/18/2017 at Unknown time  . Metoprolol Tartrate 37.5 MG TABS Place 1 tablet into feeding tube daily.   08/18/2017 at 800a  . modafinil (PROVIGIL) 100 MG tablet Place 100 mg into feeding tube daily.   08/18/2017 at Unknown time  . Omega-3 Fatty Acids (FISH OIL) 1000 MG CAPS Place 1 capsule into feeding tube daily.   08/18/2017 at Unknown time  . polyethylene glycol powder (GLYCOLAX/MIRALAX) powder Take 17 g by mouth daily.   08/18/2017 at Unknown time  . sertraline (ZOLOFT) 50 MG tablet Place 50 mg into feeding tube daily.   08/18/2017 at Unknown time  . tamsulosin (FLOMAX) 0.4 MG CAPS capsule 0.4 mg daily. Per G-Tube   08/18/2017 at Unknown time   Scheduled: . allopurinol  100 mg Per Tube Daily  . amantadine  100 mg Per Tube BID  . apixaban  2.5 mg Per Tube BID  . chlorhexidine gluconate (MEDLINE KIT)  15 mL Mouth Rinse BID  . collagenase  1 application Topical Daily  . famotidine  20 mg Per Tube BID  . feeding supplement (PRO-STAT SUGAR FREE 64)  30 mL Per Tube Daily  . ipratropium-albuterol  3 mL Nebulization Q6H  . mouth rinse  15 mL Mouth Rinse QID  . metoprolol tartrate  2.5 mg Intravenous Q6H  . omega-3 acid ethyl esters  1 g Per Tube Daily  . polyethylene glycol  17 g Oral Daily  . sertraline  50 mg Per Tube Daily  . tamsulosin  0.4 mg Oral Daily   Continuous: . 0.45 % NaCl with KCl 20 mEq / L 75 mL/hr at 08/22/17 0500  . feeding supplement (VITAL AF 1.2 CAL) 1,000 mL (08/22/17 0500)  . meropenem (MERREM) IV Stopped (08/22/17 0740)  . propofol (DIPRIVAN) infusion 15 mcg/kg/min (08/22/17 0500)  . vancomycin 1,000 mg (08/22/17 5974)   BUL:AGTXMIWOEHOZY **  OR** acetaminophen, midazolam, ondansetron **OR** ondansetron (ZOFRAN) IV  Assesment: He was admitted with sepsis.  He has healthcare associated pneumonia which is being treated appropriately.  He seems to be better as far as the sepsis is concerned.  His pneumonia  is presumably aspiration as he has a feeding tube.  He has a tracheostomy in place but it was a non-cuffed trach tube at the nursing home.  He has acute on chronic hypoxic respiratory failure.  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  He has elevated transaminases.  He remains seriously/critically ill but he is improving Principal Problem:   Sepsis ([place]) Active Problems:   Hyperlipidemia   Essential hypertension   CAD, NATIVE VESSEL   Stroke (cerebrum) (HCC)   SAH (subarachnoid hemorrhage) (HCC)   Pneumonia   Acute hypoxemic respiratory failure (HCC)   Acute on chronic respiratory failure with hypoxia (HCC)   Sepsis due to undetermined organism ([place])   Aspiration pneumonia of both lower lobes due to gastric secretions (HCC)   Tracheostomy status (HCC)   Deep vein thrombosis (DVT) of right upper extremity (HCC)   Transaminasemia   Lobar pneumonia ([place])   Sacral decubitus ulcer, stage III (HCC)   Staphylococcal sepsis (HCC)   Healthcare-associated pneumonia   Malnutrition of moderate degree    Plan: We may have some opportunity to take him off the ventilator today.  I have discussed that with respiratory therapy    LOS: 4 days   Richard Mcpherson L 08/22/2017, 7:55 AM

## 2017-08-22 NOTE — Progress Notes (Signed)
Wife does not want patient back  On vent right now so rt and nurse will continue to monitor patient through out the night

## 2017-08-22 NOTE — Progress Notes (Addendum)
**Note De-Identified Dyrell Tuccillo Obfuscation** Patient removed from ventilator and placed on 10 L 60% ATC; tolerating well at this time.  Will titrate as tolerated.  RRT to continue to monitor.

## 2017-08-22 NOTE — Progress Notes (Signed)
PROGRESS NOTE    Richard Mcpherson  NWG:[id number] DOB: [birth year] DOA: 08/18/2017 PCP: [name], MD     Brief Narrative:  77 year old man admitted from SNF on 2/2 with complaints of fever and chills.  He has a very complex medical history.  I have reviewed his recent hospitalization from [place] in detail.  Was initially admitted to [facility] rocking him on 06/19/2017 after an unwitnessed fall and was noted to have acute mental status changes with right hemiparesis, from there was transferred to [facility] he was found to have an acute CVA for which she received IV TPA.  He then had a prolonged hospitalization with numerous complications.  Subsequently noted to have a subdural hematoma on MRI.  He had cardiac arrest with asystole which was felt to be due to to his cervical spine contusion, taken to surgery on 12/19 for an occipital to C6 fusion with C1, C2 and C3 laminectomies.  Was able to be extubated but required reintubation on 12/19.  Hospitalization was further complicated by right axillary vein DVT resulting from a right upper extremity PICC line.  He was started on a heparin drip.  He had a repeat MRI of the brain on 12/29 that showed resolving subdural hematoma.  Subsequently developed Enterobacter ventilator associated pneumonia treated with Zosyn, unfortunately he continues to have fevers and difficulty weaning from the ventilator.  Had a tracheostomy and gastrostomy tube placed during his hospitalization.  [facility] where he was weaned off the ventilator and discharged to skilled nursing facility on 08/17/17.  He was subsequently sent back to our hospital on 2/2 with fevers.  Here he was started on vancomycin and Zosyn initially, 2 out of 2 blood cultures have grown out coag negative staph with methicillin resistance, he continues on vancomycin.  Repeat blood cultures on 2/4 are negative.  He was able to be weaned from the ventilator today to  trach collar.  He remains nonverbal, cannot follow commands.  Palliative care is involved.   Assessment & Plan:   Principal Problem:   Sepsis ([place]) Active Problems:   Hyperlipidemia   Essential hypertension   CAD, NATIVE VESSEL   Stroke (cerebrum) (HCC)   SAH (subarachnoid hemorrhage) (HCC)   Pneumonia   Acute hypoxemic respiratory failure (HCC)   Acute on chronic respiratory failure with hypoxia (HCC)   Sepsis due to undetermined organism ([place])   Aspiration pneumonia of both lower lobes due to gastric secretions (HCC)   Tracheostomy status (HCC)   Deep vein thrombosis (DVT) of right upper extremity (HCC)   Transaminasemia   Lobar pneumonia ([place])   Sacral decubitus ulcer, stage III (HCC)   Staphylococcal sepsis ([place])   Healthcare-associated pneumonia   Malnutrition of moderate degree   Sepsis -Secondary to pneumonia and coag negative staph bacteremia. -Continue meropenem and vancomycin as there is concern for aspiration. -Reviewed echo report from 08/20/17: EF 65-70%, no wall motion abnormalities, no vegetations.  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 -Patient was started on mechanical ventilation on 2/3 due to respiratory distress and worsening hypoxemia, was able to be extubated today, 2/6. -Dr. Luan Pulling following. -CT scan of the chest without PE but significant left lower lobe infiltrate and consolidation.  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 -He remains  nonverbal and unable to follow commands.  Stage III sacral decubitus ulcer -Wound care following.  Severe protein caloric malnutrition -Continue enteral feedings and supplements as per dietitian recommendations.   DVT prophylaxis: Eliquis Code Status:  Full code Family Communication: Discussed in detail with wife and daughter at bedside Disposition Plan: Keep in ICU in case needs to be placed back on mechanical ventilation today.  Continue palliative care discussions given very poor short and long-term prognosis.  Consultants:   Pulmonology  Palliative care  Procedures:   Echo as above  Antimicrobials:  Anti-infectives (From admission, onward)   Start     Dose/Rate Route Frequency Ordered Stop   08/20/17 2030  vancomycin (VANCOCIN) IVPB 1000 mg/200 mL premix     1,000 mg 200 mL/hr over 60 Minutes Intravenous Every 12 hours 08/20/17 0834     08/20/17 0830  vancomycin (VANCOCIN) 1,500 mg in sodium chloride 0.9 % 500 mL IVPB     1,500 mg 250 mL/hr over 120 Minutes Intravenous  Once 08/20/17 0818 08/20/17 1125   08/19/17 1800  vancomycin (VANCOCIN) IVPB 750 mg/150 ml premix  Status:  Discontinued     750 mg 150 mL/hr over 60 Minutes Intravenous Every 12 hours 08/19/17 1026 08/19/17 1425   08/19/17 0900  meropenem (MERREM) 2 g in sodium chloride 0.9 % 100 mL IVPB  Status:  Discontinued     2 g 200 mL/hr over 30 Minutes Intravenous Every 8 hours 08/19/17 0837 08/19/17 0839   08/19/17 0900  meropenem (MERREM) 1 g in sodium chloride 0.9 % 100 mL IVPB     1 g 200 mL/hr over 30 Minutes Intravenous Every 8 hours 08/19/17 0839     08/19/17 0600  piperacillin-tazobactam (ZOSYN) IVPB 3.375 g  Status:  Discontinued     3.375 g 12.5 mL/hr over 240 Minutes Intravenous Every 8 hours 08/18/17 2316 08/19/17 0825   08/19/17 0600  vancomycin (VANCOCIN) IVPB 1000 mg/200 mL premix     1,000 mg 200 mL/hr over 60 Minutes Intravenous  Once 08/18/17 2318 08/19/17 0610   08/18/17 2200  piperacillin-tazobactam (ZOSYN) IVPB 3.375 g     3.375 g 100 mL/hr over 30 Minutes Intravenous  Once 08/18/17 2159 08/18/17 2252   08/18/17 2200  vancomycin (VANCOCIN) IVPB 1000 mg/200 mL premix     1,000 mg 200 mL/hr over 60 Minutes Intravenous  Once 08/18/17 2159  08/18/17 2307       Subjective: Lying in bed, eyes open, nonverbal, cannot follow commands such as asking him to blink his eyes or squeeze my hands.  Objective: Vitals:   08/22/17 0728 08/22/17 0737 08/22/17 1112 08/22/17 1200  BP:      Pulse:  83    Resp:  20    Temp:  (!) 97.4 F (36.3 C)  98.5 F (36.9 C)  TempSrc:  Axillary    SpO2: 98% 96% 100%   Weight:      Height:        Intake/Output Summary (Last 24 hours) at 08/22/2017 1238 Last data filed at 08/22/2017 0500 Gross per 24 hour  Intake 2689.38 ml  Output 850 ml  Net 1839.38 ml   Filed Weights   08/19/17 0125 08/21/17 0500 08/22/17 0500  Weight: 74.9 kg (165 lb 2 oz) 81.8 kg (180 lb 5.4 oz) 81 kg (178 lb 9.2 oz)    Examination:  General exam: Awake, not able to assess orientation given his nonverbal state Respiratory system: Decreased breath sounds to left base, no wheezes Cardiovascular  system:RRR. No murmurs, rubs, gallops. Gastrointestinal system: Abdomen is nondistended, soft and nontender. No organomegaly or masses felt. Normal bowel sounds heard. Central nervous system: Unable to fully assess given current mental state, however throughout the 45 minutes that I have spent at bedside I have not noticed any movement of upper or lower extremities. Extremities: No C/C/E, +pedal pulses Psychiatry: Unable to assess given current mental state    Data Reviewed: I have personally reviewed following labs and imaging studies  CBC: Recent Labs  Lab 08/18/17 2145 08/19/17 0450 08/20/17 0405 08/21/17 1258 08/22/17 0531  WBC 19.4* 21.9* 20.9* 12.5* 9.5  NEUTROABS 16.9  --   --   --   --   HGB 11.8* 10.7* 9.8* 8.4* 8.2*  HCT 39.7 36.7* 33.5* 28.8* 27.5*  MCV 87.6 88.2 89.1 87.8 86.8  PLT 348 298 263 224 711   Basic Metabolic Panel: Recent Labs  Lab 08/18/17 2145 08/19/17 0450 08/20/17 0405 08/21/17 1258 08/22/17 0531  NA 145 146* 144 141 139  K 4.1 3.6 3.7 3.4* 3.2*  CL 109 112* 108 110 107  CO2 _0 GLUCOSE 147* 129* 93 112* 108*  BUN 39* 33* 34* 31* 27*  CREATININE 0.54* 0.39* 0.41* 0.30* <0.30*  CALCIUM 9.0 8.9 8.7* 8.3* 8.4*  MG  --   --  1.9  --   --   PHOS  --   --  3.2  --   --    GFR: CrCl cannot be calculated (This lab value cannot be used to calculate CrCl because it is not a number: <0.30). Liver Function Tests: Recent Labs  Lab 08/18/17 2145 08/20/17 0405 08/21/17 1258  AST 55* 24 52*  ALT 97* 50 56  ALKPHOS 414* 260* 279*  BILITOT 0.6 1.2 0.9  PROT 7.3 6.1* 5.6*  ALBUMIN 2.3* 1.9* 1.6*   No results for input(s): LIPASE, AMYLASE in the last 168 hours. No results for input(s): AMMONIA in the last 168 hours. Coagulation Profile: Recent Labs  Lab 08/17/17 0548 08/18/17 2145  INR 1.20 1.37   Cardiac Enzymes: No results for input(s): CKTOTAL, CKMB, CKMBINDEX, TROPONINI in the last 168 hours. BNP (last 3 results) No results for input(s): PROBNP in the last 8760 hours. HbA1C: No results for input(s): HGBA1C in the last 72 hours. CBG: Recent Labs  Lab 08/19/17 0736 08/21/17 1608  GLUCAP 135* 120*   Lipid Profile: No results for input(s): CHOL, HDL, LDLCALC, TRIG, CHOLHDL, LDLDIRECT in the last 72 hours. Thyroid Function Tests: No results for input(s): TSH, T4TOTAL, FREET4, T3FREE, THYROIDAB in the last 72 hours. Anemia Panel: No results for input(s): VITAMINB12, FOLATE, FERRITIN, TIBC, IRON, RETICCTPCT in the last 72 hours. Urine analysis:    Component Value Date/Time   COLORURINE AMBER (A) 08/18/2017 2146   APPEARANCEUR HAZY (A) 08/18/2017 2146   LABSPEC 1.025 08/18/2017 2146   PHURINE 6.0 08/18/2017 2146   GLUCOSEU NEGATIVE 08/18/2017 2146   HGBUR MODERATE (A) 08/18/2017 2146   BILIRUBINUR NEGATIVE 08/18/2017 2146   [place] NEGATIVE 08/18/2017 2146   PROTEINUR 30 (A) 08/18/2017 2146   NITRITE NEGATIVE 08/18/2017 2146   LEUKOCYTESUR NEGATIVE 08/18/2017 2146   Sepsis Labs: _1 (procalcitonin:4,lacticidven:4)  ) Recent  Results (from the past 240 hour(s))  Culture, blood (Routine x 2)     Status: Abnormal   Collection Time: 08/18/17  9:42 PM  Result Value Ref Range Status   Specimen Description   Final    LEFT ANTECUBITAL Performed at [facility]  [address] [place], [place] [address]    Special Requests   Final    BOTTLES DRAWN AEROBIC AND ANAEROBIC Blood Culture adequate volume Performed at [facility], [address], [place], [place] [address]    Culture  Setup Time   Final    GRAM POSITIVE COCCI Gram Stain Report Called to,Read Back By and Verified With: GAMMONS,S. AT 0203 ON 08/20/2017 BY EVA Performed at [facility]    Culture (A)  Final    STAPHYLOCOCCUS SPECIES (COAGULASE NEGATIVE) SUSCEPTIBILITIES PERFORMED ON PREVIOUS CULTURE WITHIN THE LAST 5 DAYS. Performed at [facility], [place] [address] [place], [place] [address]    Report Status 08/22/2017 FINAL  Final  Blood Culture ID Panel (Reflexed)     Status: Abnormal   Collection Time: 08/18/17  9:42 PM  Result Value Ref Range Status   Enterococcus species NOT DETECTED NOT DETECTED Final   Listeria monocytogenes NOT DETECTED NOT DETECTED Final   Staphylococcus species DETECTED (A) NOT DETECTED Final    Comment: Methicillin (oxacillin) resistant coagulase negative staphylococcus. Possible blood culture contaminant (unless isolated from more than one blood culture draw or clinical case suggests pathogenicity). No antibiotic treatment is indicated for blood  culture contaminants. L. Seay Pharm.D. 7:45 08/20/17 (wilsonm)    Staphylococcus aureus NOT DETECTED NOT DETECTED Final   Methicillin resistance DETECTED (A) NOT DETECTED Final    Comment: CRITICAL RESULT CALLED TO, READ BACK BY AND VERIFIED WITH: L. Seay Pharm.D. 7:45 08/20/17 (wilsonm)    Streptococcus species NOT DETECTED NOT DETECTED Final   Streptococcus agalactiae NOT DETECTED NOT DETECTED Final   Streptococcus pneumoniae NOT DETECTED NOT DETECTED Final    Streptococcus pyogenes NOT DETECTED NOT DETECTED Final   Acinetobacter baumannii NOT DETECTED NOT DETECTED Final   Enterobacteriaceae species NOT DETECTED NOT DETECTED Final   Enterobacter cloacae complex NOT DETECTED NOT DETECTED Final   Escherichia coli NOT DETECTED NOT DETECTED Final   Klebsiella oxytoca NOT DETECTED NOT DETECTED Final   Klebsiella pneumoniae NOT DETECTED NOT DETECTED Final   Proteus species NOT DETECTED NOT DETECTED Final   Serratia marcescens NOT DETECTED NOT DETECTED Final   Haemophilus influenzae NOT DETECTED NOT DETECTED Final   Neisseria meningitidis NOT DETECTED NOT DETECTED Final   Pseudomonas aeruginosa NOT DETECTED NOT DETECTED Final   Candida albicans NOT DETECTED NOT DETECTED Final   Candida glabrata NOT DETECTED NOT DETECTED Final   Candida krusei NOT DETECTED NOT DETECTED Final   Candida parapsilosis NOT DETECTED NOT DETECTED Final   Candida tropicalis NOT DETECTED NOT DETECTED Final    Comment: Performed at [facility], [place] [address], [place], [place] [address]  Urine culture     Status: None   Collection Time: 08/18/17  9:46 PM  Result Value Ref Range Status   Specimen Description   Final    URINE, CLEAN CATCH Performed at [facility], [address] [place], [place] [address]    Special Requests   Final    NONE Performed at [facility], [address], [place], [place] [address]    Culture   Final    NO GROWTH Performed at [facility], [place] [address] [place], [place] [address]    Report Status 08/20/2017 FINAL  Final  Culture, blood (Routine x 2)     Status: Abnormal   Collection Time: 08/18/17  9:51 PM  Result Value Ref Range Status   Specimen Description   Final    BLOOD LEFT  FOREARM Performed at [facility], [address], [place], [place] [address]    Special Requests   Final    BOTTLES DRAWN AEROBIC ONLY Blood Culture adequate volume Performed at [facility], [address] [place], [place] [address]    Culture   Setup Time   Final    GRAM POSITIVE COCCI Gram Stain Report Called to,Read Back By and Verified With: HOWARD,C. AT 2138 ON 08/19/2017 BY EVA AEROBIC BOTTLE ONLY Performed at [facility]    Culture STAPHYLOCOCCUS SPECIES (COAGULASE NEGATIVE) (A)  Final   Report Status 08/22/2017 FINAL  Final   Organism ID, Bacteria STAPHYLOCOCCUS SPECIES (COAGULASE NEGATIVE)  Final      Susceptibility   Staphylococcus species (coagulase negative) - MIC*    CIPROFLOXACIN >=8 RESISTANT Resistant     ERYTHROMYCIN >=8 RESISTANT Resistant     GENTAMICIN >=16 RESISTANT Resistant     OXACILLIN >=4 RESISTANT Resistant     TETRACYCLINE 2 SENSITIVE Sensitive     VANCOMYCIN 1 SENSITIVE Sensitive     TRIMETH/SULFA 80 RESISTANT Resistant     CLINDAMYCIN >=8 RESISTANT Resistant     RIFAMPIN <=0.5 SENSITIVE Sensitive     Inducible Clindamycin NEGATIVE Sensitive     * STAPHYLOCOCCUS SPECIES (COAGULASE NEGATIVE)  Blood Culture ID Panel (Reflexed)     Status: Abnormal   Collection Time: 08/18/17  9:51 PM  Result Value Ref Range Status   Enterococcus species NOT DETECTED NOT DETECTED Final   Listeria monocytogenes NOT DETECTED NOT DETECTED Final   Staphylococcus species DETECTED (A) NOT DETECTED Final    Comment: Methicillin (oxacillin) resistant coagulase negative staphylococcus. Possible blood culture contaminant (unless isolated from more than one blood culture draw or clinical case suggests pathogenicity). No antibiotic treatment is indicated for blood  culture contaminants. CRITICAL RESULT CALLED TO, READ BACK BY AND VERIFIED WITH: TO SGANNONS(RN) BY TCLEVELAND 08/20/17 AT 5:44AM    Staphylococcus aureus NOT DETECTED NOT DETECTED Final   Methicillin resistance DETECTED (A) NOT DETECTED Final    Comment: CRITICAL RESULT CALLED TO, READ BACK BY AND VERIFIED WITH: TO SGANNONS(RN) BY TCLEVELAND 08/20/17 AT 5:44AM    Streptococcus species NOT DETECTED NOT DETECTED Final   Streptococcus agalactiae NOT DETECTED  NOT DETECTED Final   Streptococcus pneumoniae NOT DETECTED NOT DETECTED Final   Streptococcus pyogenes NOT DETECTED NOT DETECTED Final   Acinetobacter baumannii NOT DETECTED NOT DETECTED Final   Enterobacteriaceae species NOT DETECTED NOT DETECTED Final   Enterobacter cloacae complex NOT DETECTED NOT DETECTED Final   Escherichia coli NOT DETECTED NOT DETECTED Final   Klebsiella oxytoca NOT DETECTED NOT DETECTED Final   Klebsiella pneumoniae NOT DETECTED NOT DETECTED Final   Proteus species NOT DETECTED NOT DETECTED Final   Serratia marcescens NOT DETECTED NOT DETECTED Final   Haemophilus influenzae NOT DETECTED NOT DETECTED Final   Neisseria meningitidis NOT DETECTED NOT DETECTED Final   Pseudomonas aeruginosa NOT DETECTED NOT DETECTED Final   Candida albicans NOT DETECTED NOT DETECTED Final   Candida glabrata NOT DETECTED NOT DETECTED Final   Candida krusei NOT DETECTED NOT DETECTED Final   Candida parapsilosis NOT DETECTED NOT DETECTED Final   Candida tropicalis NOT DETECTED NOT DETECTED Final    Comment: Performed at [facility], [place]. [address] [place], [place] [address]  MRSA PCR Screening     Status: None   Collection Time: 08/19/17  1:09 AM  Result Value Ref Range Status   MRSA by PCR NEGATIVE NEGATIVE Final    Comment:  The GeneXpert MRSA Assay (FDA approved for NASAL specimens only), is one component of a comprehensive MRSA colonization surveillance program. It is not intended to diagnose MRSA infection nor to guide or monitor treatment for MRSA infections. Performed at [facility], [address], [place], [place] [address]   Respiratory Panel by PCR     Status: None   Collection Time: 08/19/17  1:55 AM  Result Value Ref Range Status   Adenovirus NOT DETECTED NOT DETECTED Final   Coronavirus 229E NOT DETECTED NOT DETECTED Final   Coronavirus HKU1 NOT DETECTED NOT DETECTED Final   Coronavirus NL63 NOT DETECTED NOT DETECTED Final   Coronavirus OC43 NOT  DETECTED NOT DETECTED Final   Metapneumovirus NOT DETECTED NOT DETECTED Final   Rhinovirus / Enterovirus NOT DETECTED NOT DETECTED Final   Influenza A NOT DETECTED NOT DETECTED Final   Influenza B NOT DETECTED NOT DETECTED Final   Parainfluenza Virus 1 NOT DETECTED NOT DETECTED Final   Parainfluenza Virus 2 NOT DETECTED NOT DETECTED Final   Parainfluenza Virus 3 NOT DETECTED NOT DETECTED Final   Parainfluenza Virus 4 NOT DETECTED NOT DETECTED Final   Respiratory Syncytial Virus NOT DETECTED NOT DETECTED Final   Bordetella pertussis NOT DETECTED NOT DETECTED Final   Chlamydophila pneumoniae NOT DETECTED NOT DETECTED Final   Mycoplasma pneumoniae NOT DETECTED NOT DETECTED Final    Comment: Performed at [facility], [place] [address], [place], [place] [address]  Culture, respiratory (NON-Expectorated)     Status: None   Collection Time: 08/19/17  2:40 AM  Result Value Ref Range Status   Specimen Description   Final    TRACHEAL ASPIRATE Performed at [facility], [address] [place], [place] [address]    Special Requests   Final    NONE Performed at [facility], [address], [place], [place] [address]    Gram Stain   Final    ABUNDANT WBC PRESENT, PREDOMINANTLY PMN RARE SQUAMOUS EPITHELIAL CELLS PRESENT ABUNDANT GRAM POSITIVE COCCI IN PAIRS IN CLUSTERS FEW GRAM POSITIVE RODS FEW GRAM NEGATIVE RODS    Culture   Final    Consistent with normal respiratory flora. Performed at [facility], [place] [address] [place], [place] [address]    Report Status 08/21/2017 FINAL  Final  Culture, blood (Routine X 2) w Reflex to ID Panel     Status: None (Preliminary result)   Collection Time: 08/20/17  7:51 AM  Result Value Ref Range Status   Specimen Description BLOOD LEFT HAND  Final   Special Requests   Final    BOTTLES DRAWN AEROBIC AND ANAEROBIC Blood Culture adequate volume   Culture   Final    NO GROWTH 2 DAYS Performed at [facility], [address], [place],   [address]    Report Status PENDING  Incomplete  Culture, blood (Routine X 2) w Reflex to ID Panel     Status: None (Preliminary result)   Collection Time: 08/20/17  7:52 AM  Result Value Ref Range Status   Specimen Description BLOOD LEFT WRIST  Final   Special Requests   Final    BOTTLES DRAWN AEROBIC AND ANAEROBIC Blood Culture adequate volume   Culture   Final    NO GROWTH 2 DAYS Performed at [facility], [address] [place],  [address]    Report Status PENDING  Incomplete         Radiology Studies: Ct Chest Wo Contrast  Result Date: 08/22/2017 CLINICAL DATA:  Respiratory failure.  Former smoker. EXAM: CT CHEST WITHOUT CONTRAST TECHNIQUE: Multidetector CT imaging of the chest was performed following the standard protocol without IV contrast. COMPARISON:  None. FINDINGS: Cardiovascular: Heart size appears normal. Aortic atherosclerosis noted. Calcifications within the left circumflex, lad coronary arteries noted. Mediastinum/Nodes: The trachea appears patent and midline. Tracheostomy tube tip is above the carina. Normal appearance of the esophagus. Subcarinal lymph node is enlarged measuring 1.4 cm. No axillary or supraclavicular adenopathy. Evaluation of the hilar nodes limited due to lack of IV contrast material. Lungs/Pleura: Trace left pleural effusion. Moderate change of emphysema. There is dense airspace consolidation with air bronchograms involving much of the left lower lobe. Mild subsegmental atelectasis and subpleural consolidation noted in the posterior right lower lobe. Scattered small ground-glass and tree-in-bud nodules identified within the left upper lobe. Calcified granuloma identified within the right upper lobe. Small noncalcified right middle lobe lung nodule measures 3 mm, image 91 of series 4. Upper Abdomen: No acute abnormality. Musculoskeletal: No chest wall mass or suspicious bone lesions identified. IMPRESSION: 1. There is dense airspace consolidation  involving much of the left lower lobe. Findings are compatible with pneumonia and/or aspiration. Followup imaging following appropriate antibiotic therapy is advised to ensure resolution. In the absence of resolution there should be consideration for underlying pulmonary malignancy. 2. Small scattered ground-glass and tree-in-bud nodules in the left upper lobe are likely inflammatory in etiology. 3. Enlarged subcarinal lymph node may be reactive in the setting of pneumonia. Attention on follow-up imaging is advised. 4. Aortic Atherosclerosis (ICD10-I70.0) and Emphysema (ICD10-J43.9). Atherosclerotic calcifications in the LAD and left circumflex coronary arteries noted. 5. Right middle lobe lung nodule measures 3 mm. Non-contrast chest CT can be considered in 12 months if patient is high-risk. This recommendation follows the consensus statement: Guidelines for Management of Incidental Pulmonary Nodules Detected on CT Images: From the Fleischner Society 2017; Radiology 2017; 284:228-243. Electronically Signed   By: Kerby Moors M.D.   On: 08/22/2017 10:21   Dg Chest Port 1 View  Result Date: 08/21/2017 CLINICAL DATA:  Respiratory failure, CVA, sepsis EXAM: PORTABLE CHEST 1 VIEW COMPARISON:  Portable chest x-ray of August 20, 2017 FINDINGS: The lungs are adequately inflated. There is dense infiltrate in the left lower lobe. There remains obscuration of the left hemidiaphragm. The heart is normal in size. The pulmonary vascularity is not engorged. The endotracheal tube tip projects at the inferior margin of the clavicular heads. There calcification in the wall of the aortic arch. IMPRESSION: Left lower lobe atelectasis and/or pneumonia more conspicuous today. Small left pleural effusion. Thoracic aortic atherosclerosis. Electronically Signed   By: Gavon  Martinique M.D.   On: 08/21/2017 06:54        Scheduled Meds: . allopurinol  100 mg Per Tube Daily  . amantadine  100 mg Per Tube BID  . apixaban  2.5 mg  Per Tube BID  . chlorhexidine gluconate (MEDLINE KIT)  15 mL Mouth Rinse BID  . collagenase  1 application Topical Daily  . famotidine  20 mg Per Tube BID  . feeding supplement (PRO-STAT SUGAR FREE 64)  30 mL Per Tube Daily  . ipratropium-albuterol  3 mL Nebulization Q6H  . mouth rinse  15 mL Mouth Rinse QID  . metoprolol tartrate  2.5 mg Intravenous Q6H  . omega-3 acid ethyl esters  1 g Per Tube Daily  . polyethylene glycol  17 g Oral Daily  . sertraline  50 mg Per Tube Daily  . tamsulosin  0.4  mg Oral Daily   Continuous Infusions: . 0.45 % NaCl with KCl 20 mEq / L 75 mL/hr at 08/22/17 0500  . feeding supplement (VITAL AF 1.2 CAL) 1,000 mL (08/22/17 0500)  . meropenem (MERREM) IV Stopped (08/22/17 0740)  . propofol (DIPRIVAN) infusion 15 mcg/kg/min (08/22/17 0500)  . vancomycin Stopped (08/22/17 0750)     LOS: 4 days    Time spent: 50 minutes.     Lelon Frohlich, MD [facility] Pager [phone]  If 7PM-7AM, please contact night-coverage [email or web address] Password [facility] ([facility]) 08/22/2017, 12:38 PM

## 2017-08-22 NOTE — Progress Notes (Signed)
Daily Progress Note   Patient Name: Richard Mcpherson       Date: 08/22/2017 DOB: September 19, 1940  Age: 77 y.o. MRN#: [id number] Attending Physician: Isaac Bliss, [place] Primary Care Physician: [name], MD Admit Date: 08/18/2017  Reason for Consultation/Follow-up: Establishing goals of care  Subjective: Met with patient, wife and daughter.  When I entered the room Dr. Jerilee Hoh was with the family. Reviewed MRI results from 07/25/2016 and CT results from 08/21/2017 with Richard Mcpherson and Richard Mcpherson.  Richard Mcpherson was surprised that Richard Mcpherson had had multiple strokes before the aneurysm and another acute infarction on this 1/9 MRI.  The scan also showed brain atrophy and ventriculomegaly often seen in dementia.    On the CT chest we reviewed the LLL density and the new lung nodule.  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  Further the trach is an ongoing source of infection.  So yes, he will likely always keep pneumonia.  Assessment:  Patient off ventilator this morning.  Family hopeful he will return to SNF.    Patient Profile/HPI:  77 y.o. 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  He was admitted on 08/18/2017 with fever the day after he transferred from [facility] to SNF.  Initial workup revealed sepsis secondary to bacteremia and possible aspiration pneumonia.  At SNF he was on trach collar at this time he is requiring ventilator support for respiratory failure.      Length of Stay: 4  Current Medications: Scheduled Meds:  . allopurinol  100 mg Per Tube Daily  . amantadine  100 mg Per Tube  BID  . apixaban  2.5 mg Per Tube BID  . chlorhexidine gluconate (MEDLINE KIT)  15 mL Mouth Rinse BID  . collagenase  1 application Topical Daily  . famotidine  20 mg Per Tube BID  . feeding supplement (PRO-STAT SUGAR FREE 64)  30 mL Per Tube Daily  . ipratropium-albuterol  3 mL Nebulization Q6H  . mouth rinse  15 mL Mouth Rinse QID  . metoprolol tartrate  2.5 mg Intravenous Q6H  . omega-3 acid ethyl esters  1 g Per Tube Daily  . polyethylene glycol  17 g Oral Daily  . sertraline  50 mg Per Tube Daily  . tamsulosin  0.4 mg Oral Daily    Continuous Infusions: . 0.45 % NaCl with KCl 20 mEq / L 75 mL/hr at 08/22/17 0500  . feeding supplement (VITAL AF 1.2 CAL) 1,000 mL (08/22/17 0500)  . meropenem (MERREM) IV Stopped (08/22/17 0740)  . propofol (DIPRIVAN) infusion 15 mcg/kg/min (08/22/17 0500)  . vancomycin Stopped (08/22/17 0750)    PRN Meds: acetaminophen **OR** acetaminophen, midazolam, ondansetron **OR** ondansetron (ZOFRAN) IV  Physical Exam        Well developed chronically ill appearing male, awake, non-verbal Resp on trach collar and 100% oxygen.  No distress Neuro patient will turn his head to the left and track with his eyes, but he is unable to follow commands or speak.  Vital Signs: BP 110/60 (BP Location: Left Arm)   Pulse 83   Temp 98.5 F (36.9 C)   Resp 20   Ht 6' (1.829 m)   Wt 81 kg (178 lb 9.2 oz)   SpO2 100%   BMI 24.22 kg/m  SpO2: SpO2: 100 % O2 Device: O2 Device: Tracheostomy Collar O2 Flow Rate: O2 Flow Rate (L/min): 10 L/min  Intake/output summary:   Intake/Output Summary (Last 24 hours) at 08/22/2017 1318 Last data filed at 08/22/2017 0500 Gross per 24 hour  Intake 2689.38 ml  Output 850 ml  Net 1839.38 ml   LBM: Last BM Date: 08/19/17 Baseline Weight: Weight: 83.4 kg (183 lb 14.4 oz) Most recent weight: Weight: 81 kg (178 lb 9.2 oz)       Palliative Assessment/Data: 10%      Patient Active Problem List   Diagnosis Date Noted  .  Malnutrition of moderate degree 08/21/2017  . Sacral decubitus ulcer, stage III ([place]) 08/20/2017  . Staphylococcal sepsis ([place]) 08/20/2017  . Healthcare-associated pneumonia   . Acute on chronic respiratory failure with hypoxia ([place]) 08/19/2017  . Sepsis due to undetermined organism ([place]) 08/19/2017  . Aspiration pneumonia of both lower lobes due to gastric secretions ([place]) 08/19/2017  . 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 Deep vein thrombosis (DVT) of right upper extremity ([place]) 08/19/2017  . Lobar pneumonia ([place]) 08/19/2017  . Transaminasemia   . Sepsis ([place]) 08/18/2017  . Pneumonia 08/18/2017  . Acute hypoxemic respiratory failure ([place]) 08/18/2017  . 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 Stroke (cerebrum) ([place]) 04/18/2017  . SAH (subarachnoid hemorrhage) ([place]) 04/18/2017  . 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 Hyperlipidemia 03/25/2009  . Essential hypertension 03/25/2009  . CAD, NATIVE VESSEL 03/25/2009  . PALPITATIONS 03/25/2009    Palliative Care Plan    Recommendations/Plan:  Will continue to meet with family.    Would consider a larger family meeting as I understand Richard Mcpherson has 6 siblings who may feel as though he would not want to be like this.  I will talk with Richard Mcpherson to determine whether she will allow a larger family meeting.  Otherwise - it will likely take Richard Mcpherson another hospitalization or so before she accepts Richard Mcpherson's reality.  Goals of Care and Additional Recommendations:  Limitations on Scope of Treatment: Full Scope Treatment  Code Status:  Full code  Prognosis: Dependent on goals of care. While he is at high risk of an acute event that may take his life -   If the patient remains full aggressive care he may remain in this state for months.  If he is made comfort measures  only he will pass in a matter of days.  Discharge Planning:  To Be Determined  Care plan was discussed with Dr.  Jerilee Hoh  Thank you for allowing the Palliative Medicine Team to assist in the care of this patient.  Total time spent:  35 min.     Greater than 50%  of this time was spent counseling and coordinating care related to the above assessment and plan.  Florentina Jenny, PA-C Palliative Medicine  Please contact Palliative MedicineTeam phone at [phone] for questions and concerns between 7 am - 7 pm.   Please see AMION for individual provider pager numbers.

## 2017-08-23 LAB — VANCOMYCIN, TROUGH: Vancomycin Tr: 10 ug/mL — ABNORMAL LOW (ref 15–20)

## 2017-08-23 MED ORDER — FLUCONAZOLE IN SODIUM CHLORIDE 200-0.9 MG/100ML-% IV SOLN
100.0000 mg | INTRAVENOUS | Status: DC
  Administered 2017-08-23 – 2017-08-26 (×4): 100 mg via INTRAVENOUS
  Filled 2017-08-23 (×5): qty 50

## 2017-08-23 MED ORDER — FLUCONAZOLE 100MG IVPB
100.0000 mg | INTRAVENOUS | Status: DC
Start: 2017-08-23 — End: 2017-08-23
  Filled 2017-08-23: qty 50

## 2017-08-23 MED ORDER — SODIUM CHLORIDE 0.9 % IV SOLN
1250.0000 mg | Freq: Two times a day (BID) | INTRAVENOUS | Status: AC
  Administered 2017-08-24 – 2017-08-26 (×6): 1250 mg via INTRAVENOUS
  Filled 2017-08-23 (×7): qty 1250

## 2017-08-23 MED ORDER — VITAL AF 1.2 CAL PO LIQD
1000.0000 mL | ORAL | Status: DC
  Administered 2017-08-24 – 2017-08-26 (×4): 1000 mL
  Filled 2017-08-23 (×8): qty 1000

## 2017-08-23 NOTE — Progress Notes (Addendum)
Subjective: He was able to come off the ventilator yesterday and is on collar.  His respiratory rate is up a little bit but he is overall better.  Objective: Vital signs in last 24 hours: Temp:  [98.1 F (36.7 C)-99.7 F (37.6 C)] 99.3 F (37.4 C) (02/07 0743) Pulse Rate:  [74-86] 79 (02/07 0743) Resp:  [20-33] 30 (02/07 0743) BP: (109-134)/(50-70) 120/58 (02/07 0600) SpO2:  [87 %-100 %] 98 % (02/07 0743) FiO2 (%):  [35 %-60 %] 35 % (02/07 0736) Weight:  [82.5 kg (181 lb 14.1 oz)] 82.5 kg (181 lb 14.1 oz) (02/07 0500) Weight change: 1.5 kg (3 lb 4.9 oz) Last BM Date: 08/22/17  Intake/Output from previous day: 02/06 0701 - 02/07 0700 In: -  Out: 1000 [Urine:1000]  PHYSICAL EXAM General appearance: He is awake.  No meaningful response to verbal Resp: rhonchi bilaterally Cardio: regular rate and rhythm, S1, S2 normal, no murmur, click, rub or gallop GI: soft, non-tender; bowel sounds normal; no masses,  no organomegaly Extremities: extremities normal, atraumatic, no cyanosis or edema Skin turgor fair  Lab Results:  Results for orders placed or performed during the hospital encounter of 08/18/17 (from the past 48 hour(s))  Basic metabolic panel     Status: Abnormal   Collection Time: 08/21/17 12:58 PM  Result Value Ref Range   Sodium 141 135 - 145 mmol/L   Potassium 3.4 (L) 3.5 - 5.1 mmol/L   Chloride 110 101 - 111 mmol/L   CO2 23 22 - 32 mmol/L   Glucose, Bld 112 (H) 65 - 99 mg/dL   BUN 31 (H) 6 - 20 mg/dL   Creatinine, Ser 0.30 (L) 0.61 - 1.24 mg/dL   Calcium 8.3 (L) 8.9 - 10.3 mg/dL   GFR calc non Af Amer >60 >60 mL/min   GFR calc Af Amer >60 >60 mL/min    Comment: (NOTE) The eGFR has been calculated using the CKD EPI equation. This calculation has not been validated in all clinical situations. eGFR's persistently <60 mL/min signify possible Chronic Kidney Disease.    Anion gap 8 5 - 15    Comment: Performed at [facility], [address] [place], [place]  [address]  CBC     Status: Abnormal   Collection Time: 08/21/17 12:58 PM  Result Value Ref Range   WBC 12.5 (H) 4.0 - 10.5 K/uL   RBC 3.28 (L) 4.22 - 5.81 MIL/uL   Hemoglobin 8.4 (L) 13.0 - 17.0 g/dL   HCT 28.8 (L) 39.0 - 52.0 %   MCV 87.8 78.0 - 100.0 fL   MCH 25.6 (L) 26.0 - 34.0 pg   MCHC 29.2 (L) 30.0 - 36.0 g/dL   RDW 16.4 (H) 11.5 - 15.5 %   Platelets 224 150 - 400 K/uL    Comment: Performed at [facility], [address] [place], [place] [address]  Hepatic function panel     Status: Abnormal   Collection Time: 08/21/17 12:58 PM  Result Value Ref Range   Total Protein 5.6 (L) 6.5 - 8.1 g/dL   Albumin 1.6 (L) 3.5 - 5.0 g/dL   AST 52 (H) 15 - 41 U/L   ALT 56 17 - 63 U/L   Alkaline Phosphatase 279 (H) 38 - 126 U/L   Total Bilirubin 0.9 0.3 - 1.2 mg/dL   Bilirubin, Direct 0.5 0.1 - 0.5 mg/dL   Indirect Bilirubin 0.4 0.3 - 0.9 mg/dL    Comment: Performed at [facility], [address], [place], [place]  [address]  Glucose, capillary     Status: Abnormal   Collection Time: 08/21/17  4:08 PM  Result Value Ref Range   Glucose-Capillary 120 (H) 65 - 99 mg/dL   Comment 1 Notify RN    Comment 2 Document in Chart   Blood gas, arterial     Status: Abnormal   Collection Time: 08/22/17  4:55 AM  Result Value Ref Range   FIO2 40.00    Delivery systems VENTILATOR    Mode PRESSURE REGULATED VOLUME CONTROL    VT 620 mL   LHR 20 resp/min   Peep/cpap 5.0 cm H20   pH, Arterial 7.480 (H) 7.350 - 7.450   pCO2 arterial 34.9 32.0 - 48.0 mmHg   pO2, Arterial 104.0 83.0 - 108.0 mmHg   Bicarbonate 26.7 20.0 - 28.0 mmol/L   Acid-Base Excess 2.4 (H) 0.0 - 2.0 mmol/L   O2 Saturation 98.0 %   Collection site LEFT RADIAL    Drawn by [id number]    Sample type ARTERIAL    Allens test (pass/fail) PASS PASS    Comment: Performed at [facility], [address] [place], [place] [address]  Basic metabolic panel     Status: Abnormal   Collection Time: 08/22/17  5:31 AM  Result Value Ref Range   Sodium 139  135 - 145 mmol/L   Potassium 3.2 (L) 3.5 - 5.1 mmol/L   Chloride 107 101 - 111 mmol/L   CO2 24 22 - 32 mmol/L   Glucose, Bld 108 (H) 65 - 99 mg/dL   BUN 27 (H) 6 - 20 mg/dL   Creatinine, Ser <0.30 (L) 0.61 - 1.24 mg/dL   Calcium 8.4 (L) 8.9 - 10.3 mg/dL   GFR calc non Af Amer NOT CALCULATED >60 mL/min   GFR calc Af Amer NOT CALCULATED >60 mL/min    Comment: (NOTE) The eGFR has been calculated using the CKD EPI equation. This calculation has not been validated in all clinical situations. eGFR's persistently <60 mL/min signify possible Chronic Kidney Disease.    Anion gap 8 5 - 15    Comment: Performed at [facility], [address] [place], [place] [address]  CBC     Status: Abnormal   Collection Time: 08/22/17  5:31 AM  Result Value Ref Range   WBC 9.5 4.0 - 10.5 K/uL   RBC 3.17 (L) 4.22 - 5.81 MIL/uL   Hemoglobin 8.2 (L) 13.0 - 17.0 g/dL   HCT 27.5 (L) 39.0 - 52.0 %   MCV 86.8 78.0 - 100.0 fL   MCH 25.9 (L) 26.0 - 34.0 pg   MCHC 29.8 (L) 30.0 - 36.0 g/dL   RDW 16.2 (H) 11.5 - 15.5 %   Platelets 201 150 - 400 K/uL    Comment: Performed at [facility], [address] [place], [place] [address]  Triglycerides     Status: None   Collection Time: 08/22/17  2:53 PM  Result Value Ref Range   Triglycerides 144 <150 mg/dL    Comment: Performed at [facility], [address], [place], [place] [address]    ABGS Recent Labs    08/22/17 0455  PHART 7.480*  PO2ART 104.0  HCO3 26.7   CULTURES Recent Results (from the past 240 hour(s))  Culture, blood (Routine x 2)     Status: Abnormal   Collection Time: 08/18/17  9:42 PM  Result Value Ref Range Status   Specimen Description   Final    LEFT ANTECUBITAL Performed at [facility],  [address] [place], [place] [address]    Special Requests   Final    BOTTLES DRAWN AEROBIC AND ANAEROBIC Blood Culture adequate volume Performed at [facility], [address] [place], [place] [address]    Culture  Setup Time   Final     GRAM POSITIVE COCCI Gram Stain Report Called to,Read Back By and Verified With: GAMMONS,S. AT 0203 ON 08/20/2017 BY EVA Performed at [facility]    Culture (A)  Final    STAPHYLOCOCCUS SPECIES (COAGULASE NEGATIVE) SUSCEPTIBILITIES PERFORMED ON PREVIOUS CULTURE WITHIN THE LAST 5 DAYS. Performed at [facility], [place] [address], [place], [place] [address]    Report Status 08/22/2017 FINAL  Final  Blood Culture ID Panel (Reflexed)     Status: Abnormal   Collection Time: 08/18/17  9:42 PM  Result Value Ref Range Status   Enterococcus species NOT DETECTED NOT DETECTED Final   Listeria monocytogenes NOT DETECTED NOT DETECTED Final   Staphylococcus species DETECTED (A) NOT DETECTED Final    Comment: Methicillin (oxacillin) resistant coagulase negative staphylococcus. Possible blood culture contaminant (unless isolated from more than one blood culture draw or clinical case suggests pathogenicity). No antibiotic treatment is indicated for blood  culture contaminants. L. Seay Pharm.D. 7:45 08/20/17 (wilsonm)    Staphylococcus aureus NOT DETECTED NOT DETECTED Final   Methicillin resistance DETECTED (A) NOT DETECTED Final    Comment: CRITICAL RESULT CALLED TO, READ BACK BY AND VERIFIED WITH: L. Seay Pharm.D. 7:45 08/20/17 (wilsonm)    Streptococcus species NOT DETECTED NOT DETECTED Final   Streptococcus agalactiae NOT DETECTED NOT DETECTED Final   Streptococcus pneumoniae NOT DETECTED NOT DETECTED Final   Streptococcus pyogenes NOT DETECTED NOT DETECTED Final   Acinetobacter baumannii NOT DETECTED NOT DETECTED Final   Enterobacteriaceae species NOT DETECTED NOT DETECTED Final   Enterobacter cloacae complex NOT DETECTED NOT DETECTED Final   Escherichia coli NOT DETECTED NOT DETECTED Final   Klebsiella oxytoca NOT DETECTED NOT DETECTED Final   Klebsiella pneumoniae NOT DETECTED NOT DETECTED Final   Proteus species NOT DETECTED NOT DETECTED Final   Serratia marcescens NOT DETECTED NOT  DETECTED Final   Haemophilus influenzae NOT DETECTED NOT DETECTED Final   Neisseria meningitidis NOT DETECTED NOT DETECTED Final   Pseudomonas aeruginosa NOT DETECTED NOT DETECTED Final   Candida albicans NOT DETECTED NOT DETECTED Final   Candida glabrata NOT DETECTED NOT DETECTED Final   Candida krusei NOT DETECTED NOT DETECTED Final   Candida parapsilosis NOT DETECTED NOT DETECTED Final   Candida tropicalis NOT DETECTED NOT DETECTED Final    Comment: Performed at [facility], [place] [address] [place], [place] [address]  Urine culture     Status: None   Collection Time: 08/18/17  9:46 PM  Result Value Ref Range Status   Specimen Description   Final    URINE, CLEAN CATCH Performed at [facility], [address] [place], [place] [address]    Special Requests   Final    NONE Performed at [facility], [address], [place], [place] [address]    Culture   Final    NO GROWTH Performed at [facility], [place] [address], [place], [place] [address]    Report Status 08/20/2017 FINAL  Final  Culture, blood (Routine x 2)     Status: Abnormal   Collection Time: 08/18/17  9:51 PM  Result Value Ref Range Status   Specimen Description   Final    BLOOD LEFT  FOREARM Performed at [facility], [address], [place], [place] [address]    Special Requests   Final    BOTTLES DRAWN AEROBIC ONLY Blood Culture adequate volume Performed at [facility], [address], [place], [place] [address]    Culture  Setup Time   Final    GRAM POSITIVE COCCI Gram Stain Report Called to,Read Back By and Verified With: HOWARD,C. AT 2138 ON 08/19/2017 BY EVA AEROBIC BOTTLE ONLY Performed at [facility]    Culture STAPHYLOCOCCUS SPECIES (COAGULASE NEGATIVE) (A)  Final   Report Status 08/22/2017 FINAL  Final   Organism ID, Bacteria STAPHYLOCOCCUS SPECIES (COAGULASE NEGATIVE)  Final      Susceptibility   Staphylococcus species (coagulase negative) - MIC*    CIPROFLOXACIN >=8 RESISTANT  Resistant     ERYTHROMYCIN >=8 RESISTANT Resistant     GENTAMICIN >=16 RESISTANT Resistant     OXACILLIN >=4 RESISTANT Resistant     TETRACYCLINE 2 SENSITIVE Sensitive     VANCOMYCIN 1 SENSITIVE Sensitive     TRIMETH/SULFA 80 RESISTANT Resistant     CLINDAMYCIN >=8 RESISTANT Resistant     RIFAMPIN <=0.5 SENSITIVE Sensitive     Inducible Clindamycin NEGATIVE Sensitive     * STAPHYLOCOCCUS SPECIES (COAGULASE NEGATIVE)  Blood Culture ID Panel (Reflexed)     Status: Abnormal   Collection Time: 08/18/17  9:51 PM  Result Value Ref Range Status   Enterococcus species NOT DETECTED NOT DETECTED Final   Listeria monocytogenes NOT DETECTED NOT DETECTED Final   Staphylococcus species DETECTED (A) NOT DETECTED Final    Comment: Methicillin (oxacillin) resistant coagulase negative staphylococcus. Possible blood culture contaminant (unless isolated from more than one blood culture draw or clinical case suggests pathogenicity). No antibiotic treatment is indicated for blood  culture contaminants. CRITICAL RESULT CALLED TO, READ BACK BY AND VERIFIED WITH: TO SGANNONS(RN) BY TCLEVELAND 08/20/17 AT 5:44AM    Staphylococcus aureus NOT DETECTED NOT DETECTED Final   Methicillin resistance DETECTED (A) NOT DETECTED Final    Comment: CRITICAL RESULT CALLED TO, READ BACK BY AND VERIFIED WITH: TO SGANNONS(RN) BY TCLEVELAND 08/20/17 AT 5:44AM    Streptococcus species NOT DETECTED NOT DETECTED Final   Streptococcus agalactiae NOT DETECTED NOT DETECTED Final   Streptococcus pneumoniae NOT DETECTED NOT DETECTED Final   Streptococcus pyogenes NOT DETECTED NOT DETECTED Final   Acinetobacter baumannii NOT DETECTED NOT DETECTED Final   Enterobacteriaceae species NOT DETECTED NOT DETECTED Final   Enterobacter cloacae complex NOT DETECTED NOT DETECTED Final   Escherichia coli NOT DETECTED NOT DETECTED Final   Klebsiella oxytoca NOT DETECTED NOT DETECTED Final   Klebsiella pneumoniae NOT DETECTED NOT DETECTED Final    Proteus species NOT DETECTED NOT DETECTED Final   Serratia marcescens NOT DETECTED NOT DETECTED Final   Haemophilus influenzae NOT DETECTED NOT DETECTED Final   Neisseria meningitidis NOT DETECTED NOT DETECTED Final   Pseudomonas aeruginosa NOT DETECTED NOT DETECTED Final   Candida albicans NOT DETECTED NOT DETECTED Final   Candida glabrata NOT DETECTED NOT DETECTED Final   Candida krusei NOT DETECTED NOT DETECTED Final   Candida parapsilosis NOT DETECTED NOT DETECTED Final   Candida tropicalis NOT DETECTED NOT DETECTED Final    Comment: Performed at [facility], [place] [place], [place] [address]  MRSA PCR Screening     Status: None   Collection Time: 08/19/17  1:09 AM  Result Value Ref Range Status   MRSA by PCR NEGATIVE NEGATIVE Final    Comment:  The GeneXpert MRSA Assay (FDA approved for NASAL specimens only), is one component of a comprehensive MRSA colonization surveillance program. It is not intended to diagnose MRSA infection nor to guide or monitor treatment for MRSA infections. Performed at [facility], [address], [place], [place] [address]   Respiratory Panel by PCR     Status: None   Collection Time: 08/19/17  1:55 AM  Result Value Ref Range Status   Adenovirus NOT DETECTED NOT DETECTED Final   Coronavirus 229E NOT DETECTED NOT DETECTED Final   Coronavirus HKU1 NOT DETECTED NOT DETECTED Final   Coronavirus NL63 NOT DETECTED NOT DETECTED Final   Coronavirus OC43 NOT DETECTED NOT DETECTED Final   Metapneumovirus NOT DETECTED NOT DETECTED Final   Rhinovirus / Enterovirus NOT DETECTED NOT DETECTED Final   Influenza A NOT DETECTED NOT DETECTED Final   Influenza B NOT DETECTED NOT DETECTED Final   Parainfluenza Virus 1 NOT DETECTED NOT DETECTED Final   Parainfluenza Virus 2 NOT DETECTED NOT DETECTED Final   Parainfluenza Virus 3 NOT DETECTED NOT DETECTED Final   Parainfluenza Virus 4 NOT DETECTED NOT DETECTED Final   Respiratory Syncytial  Virus NOT DETECTED NOT DETECTED Final   Bordetella pertussis NOT DETECTED NOT DETECTED Final   Chlamydophila pneumoniae NOT DETECTED NOT DETECTED Final   Mycoplasma pneumoniae NOT DETECTED NOT DETECTED Final    Comment: Performed at [facility], [place] [address], [place], [place] [address]  Culture, respiratory (NON-Expectorated)     Status: None   Collection Time: 08/19/17  2:40 AM  Result Value Ref Range Status   Specimen Description   Final    TRACHEAL ASPIRATE Performed at [facility], [address] [place], [place] [address]    Special Requests   Final    NONE Performed at [facility], [address], [place], [place] [address]    Gram Stain   Final    ABUNDANT WBC PRESENT, PREDOMINANTLY PMN RARE SQUAMOUS EPITHELIAL CELLS PRESENT ABUNDANT GRAM POSITIVE COCCI IN PAIRS IN CLUSTERS FEW GRAM POSITIVE RODS FEW GRAM NEGATIVE RODS    Culture   Final    Consistent with normal respiratory flora. Performed at [facility], [place] [address] [place], [place] [address]    Report Status 08/21/2017 FINAL  Final  Culture, blood (Routine X 2) w Reflex to ID Panel     Status: None (Preliminary result)   Collection Time: 08/20/17  7:51 AM  Result Value Ref Range Status   Specimen Description BLOOD LEFT HAND  Final   Special Requests   Final    BOTTLES DRAWN AEROBIC AND ANAEROBIC Blood Culture adequate volume   Culture   Final    NO GROWTH 3 DAYS Performed at [facility], [address] [place], [place] [address]    Report Status PENDING  Incomplete  Culture, blood (Routine X 2) w Reflex to ID Panel     Status: None (Preliminary result)   Collection Time: 08/20/17  7:52 AM  Result Value Ref Range Status   Specimen Description BLOOD LEFT WRIST  Final   Special Requests   Final    BOTTLES DRAWN AEROBIC AND ANAEROBIC Blood Culture adequate volume   Culture   Final    NO GROWTH 3 DAYS Performed at [facility], [address], [place],  [address]    Report Status  PENDING  Incomplete   Studies/Results: Ct Chest Wo Contrast  Result Date: 08/22/2017 CLINICAL DATA:  Respiratory failure.  Former smoker. EXAM:  CT CHEST WITHOUT CONTRAST TECHNIQUE: Multidetector CT imaging of the chest was performed following the standard protocol without IV contrast. COMPARISON:  None. FINDINGS: Cardiovascular: Heart size appears normal. Aortic atherosclerosis noted. Calcifications within the left circumflex, lad coronary arteries noted. Mediastinum/Nodes: The trachea appears patent and midline. Tracheostomy tube tip is above the carina. Normal appearance of the esophagus. Subcarinal lymph node is enlarged measuring 1.4 cm. No axillary or supraclavicular adenopathy. Evaluation of the hilar nodes limited due to lack of IV contrast material. Lungs/Pleura: Trace left pleural effusion. Moderate change of emphysema. There is dense airspace consolidation with air bronchograms involving much of the left lower lobe. Mild subsegmental atelectasis and subpleural consolidation noted in the posterior right lower lobe. Scattered small ground-glass and tree-in-bud nodules identified within the left upper lobe. Calcified granuloma identified within the right upper lobe. Small noncalcified right middle lobe lung nodule measures 3 mm, image 91 of series 4. Upper Abdomen: No acute abnormality. Musculoskeletal: No chest wall mass or suspicious bone lesions identified. IMPRESSION: 1. There is dense airspace consolidation involving much of the left lower lobe. Findings are compatible with pneumonia and/or aspiration. Followup imaging following appropriate antibiotic therapy is advised to ensure resolution. In the absence of resolution there should be consideration for underlying pulmonary malignancy. 2. Small scattered ground-glass and tree-in-bud nodules in the left upper lobe are likely inflammatory in etiology. 3. Enlarged subcarinal lymph node may be reactive in the setting of pneumonia. Attention on follow-up  imaging is advised. 4. Aortic Atherosclerosis (ICD10-I70.0) and Emphysema (ICD10-J43.9). Atherosclerotic calcifications in the LAD and left circumflex coronary arteries noted. 5. Right middle lobe lung nodule measures 3 mm. Non-contrast chest CT can be considered in 12 months if patient is high-risk. This recommendation follows the consensus statement: Guidelines for Management of Incidental Pulmonary Nodules Detected on CT Images: From the Fleischner Society 2017; Radiology 2017; 284:228-243. Electronically Signed   By: Kerby Moors M.D.   On: 08/22/2017 10:21    Medications:  Prior to Admission:  Medications Prior to Admission  Medication Sig Dispense Refill Last Dose  . allopurinol (ZYLOPRIM) 100 MG tablet Place 100 mg into feeding tube 3 (three) times a week. Every Tuesday, Thursday, Sunday   08/16/2017 at Unknown time  . amantadine (SYMMETREL) 100 MG capsule Place 100 mg into feeding tube 2 (two) times daily.   08/18/2017 at Unknown time  . apixaban (ELIQUIS) 2.5 MG TABS tablet Place 2.5 mg into feeding tube 2 (two) times daily.   08/18/2017 at 1700  . collagenase (SANTYL) ointment Apply 1 application topically daily. Right buttocks area   08/18/2017 at Unknown time  . famotidine (PEPCID) 20 MG tablet Place 20 mg into feeding tube 2 (two) times daily.   08/18/2017 at Unknown time  . HYDROcodone-acetaminophen (NORCO/VICODIN) 5-325 MG tablet Place 1 tablet into feeding tube every 6 (six) hours as needed for moderate pain.   unknown  . Melatonin 3 MG TABS Place 3 mg into feeding tube at bedtime.   08/18/2017 at Unknown time  . Metoprolol Tartrate 37.5 MG TABS Place 1 tablet into feeding tube daily.   08/18/2017 at 800a  . modafinil (PROVIGIL) 100 MG tablet Place 100 mg into feeding tube daily.   08/18/2017 at Unknown time  . Omega-3 Fatty Acids (FISH OIL) 1000 MG CAPS Place 1 capsule into feeding tube daily.   08/18/2017 at Unknown time  . polyethylene glycol powder (GLYCOLAX/MIRALAX) powder Take 17 g by mouth  daily.   08/18/2017 at Unknown time  . sertraline (  ZOLOFT) 50 MG tablet Place 50 mg into feeding tube daily.   08/18/2017 at Unknown time  . tamsulosin (FLOMAX) 0.4 MG CAPS capsule 0.4 mg daily. Per G-Tube   08/18/2017 at Unknown time   Scheduled: . allopurinol  100 mg Per Tube Daily  . amantadine  100 mg Per Tube BID  . apixaban  2.5 mg Per Tube BID  . chlorhexidine gluconate (MEDLINE KIT)  15 mL Mouth Rinse BID  . collagenase  1 application Topical Daily  . famotidine  20 mg Per Tube BID  . feeding supplement (PRO-STAT SUGAR FREE 64)  30 mL Per Tube Daily  . ipratropium-albuterol  3 mL Nebulization Q6H  . mouth rinse  15 mL Mouth Rinse QID  . metoprolol tartrate  2.5 mg Intravenous Q6H  . omega-3 acid ethyl esters  1 g Per Tube Daily  . polyethylene glycol  17 g Oral Daily  . sertraline  50 mg Per Tube Daily  . tamsulosin  0.4 mg Oral Daily   Continuous: . 0.45 % NaCl with KCl 20 mEq / L 75 mL/hr at 08/22/17 1655  . feeding supplement (VITAL AF 1.2 CAL) 1,000 mL (08/23/17 0520)  . meropenem (MERREM) IV Stopped (08/23/17 0553)  . propofol (DIPRIVAN) infusion Stopped (08/23/17 0244)  . vancomycin Stopped (08/23/17 0640)   GYJ:[id number] **OR** acetaminophen, midazolam, ondansetron **OR** ondansetron (ZOFRAN) IV  Assesment: He was admitted with sepsis presumably due to healthcare associated pneumonia.  He has aspirated.  Positive blood cultures for Staphylococcus and is on appropriate treatment for that.  He has acute on chronic hypoxic respiratory failure and he required ventilator support but is now back on trach collar.  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  He has malnutrition and he is on tube feedings  He had significant issues with his neck and required  surgery for that about a month ago.  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 Principal Problem:   Sepsis ([place]) Active Problems:   Hyperlipidemia   Essential hypertension   CAD, NATIVE VESSEL   Stroke (cerebrum) (HCC)   SAH (subarachnoid hemorrhage) (HCC)   Pneumonia   Acute hypoxemic respiratory failure (HCC)   Acute on chronic respiratory failure with hypoxia (HCC)   Sepsis due to undetermined organism ([place])   Aspiration pneumonia of both lower lobes due to gastric secretions (HCC)   Tracheostomy status ([place])   Deep vein thrombosis (DVT) of right upper extremity (HCC)   Transaminasemia   Lobar pneumonia ([place])   Sacral decubitus ulcer, stage III (HCC)   Staphylococcal sepsis ([place])   Healthcare-associated pneumonia   Malnutrition of moderate degree   Palliative care encounter    Plan: Continue on trach collar.  Continue antibiotics not much else to add at this point    LOS: 5 days   Richard Mcpherson L 08/23/2017, 7:59 AM

## 2017-08-23 NOTE — Progress Notes (Signed)
  ANTIBIOTIC CONSULT NOTE Pharmacy Consult for Vancomycin/Meropenem Indication: HCAP/Sepsis  Allergies  Allergen Reactions  . Codeine Nausea Only and Other (See Comments)  . Crestor [Rosuvastatin Calcium] Other (See Comments)    bodyache  . Lipitor [Atorvastatin] Other (See Comments)    Body aches  . Statins   . Allopurinol Rash   Patient Measurements: Height: 6' (182.9 cm) Weight: 181 lb 14.1 oz (82.5 kg) IBW/kg (Calculated) : 77.6  Vital Signs: Temp: 97.6 F (36.4 C) (02/07 2000) Temp Source: Oral (02/07 2000) BP: 128/60 (02/07 1800) Pulse Rate: 77 (02/07 1800)  Labs: Recent Labs    08/21/17 1258 08/22/17 0531  WBC 12.5* 9.5  HGB 8.4* 8.2*  PLT 224 201  CREATININE 0.30* <0.30*   CrCl cannot be calculated (This lab value cannot be used to calculate CrCl because it is not a number: <0.30).  Recent Labs    08/23/17 2024  VANCOTROUGH 10*    Antimicrobials this admission:  Zosyn  2/2>>2/3 Vanc 2/2>>2/3, 2/4>> Meropenem 2/3>>  Dose adjustments this admission:  Vancomycin increased 2/7  Microbiology results:  2/4 BCx: Pending 2/3 Resp Cx: Pending 2/3  MRSA pcr (-) 2/2  BC X 2>>MRSE 2/2  Uc>>ng  Assessment: Patient initially from nursing home with fever of 101 axillary tachycardia tachypnea and oxygen sats in the 70s.  He is chronically trached and pegged. Pneumonia on x-ray.  2 BC are positive for MRSE.  Repeat micro pending.  Trough below goal.  Plan:  Increase Vancomycin 1250 mg IV q12 hours Continue Meropenem 1gm IV q8 hours F/u renal function, cultures and clinical course  Mady Gemma, [facility] 08/23/2017,9:06 PM

## 2017-08-23 NOTE — Progress Notes (Addendum)
Spent time at bedside with Dennie Bible and Hilda Lias chatting about kids, cell phones and politics. Talking about the evolution of kids and cell phones, Dennie Bible commented hopefully we won't be alive to see it.  Then she cried and said I didn't mean that, but I don't want to talk about it right now.     Dennie Bible clearly understands how sick her husband is - she is simply unable to let him go at this point.  I provided supportive listening and told Dennie Bible I would see her in the morning.  PE  Awake, alert, tracks me with his eyes and face.  When I ask him a question he attempts to mouth a response to me. On trach collar and oxygen, no distress. Abdomen soft, nt, nd, PEG in place - site clean and dry.  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 +decubitus ulceration.  Recovering from aspiration pneumonia and bacteremia.  Plan: PMT will follow up tomorrow with wife.  I anticipate that it will take another admission before she will consider changing code status or goals of care. I will ask speech if he may be appropriate for a passy muir valve to speak.  Norvel Richards, PA-C Palliative Medicine Pager: [phone]  35 min.

## 2017-08-23 NOTE — Progress Notes (Addendum)
ANTIBIOTIC CONSULT NOTE Pharmacy Consult for Vanc/meropenem Indication: HCAP/Sepsis  Allergies  Allergen Reactions  . Codeine Nausea Only and Other (See Comments)  . Crestor [Rosuvastatin Calcium] Other (See Comments)    bodyache  . Lipitor [Atorvastatin] Other (See Comments)    Body aches  . Statins   . Allopurinol Rash    Patient Measurements: Height: 6' (182.9 cm) Weight: 181 lb 14.1 oz (82.5 kg) IBW/kg (Calculated) : 77.6   Vital Signs: Temp: 98 F (36.7 C) (02/07 1108) Temp Source: Oral (02/07 1108) BP: 120/58 (02/07 0600) Pulse Rate: 81 (02/07 1108)  Labs: Recent Labs    08/21/17 1258 08/22/17 0531  WBC 12.5* 9.5  HGB 8.4* 8.2*  PLT 224 201  CREATININE 0.30* <0.30*    CrCl cannot be calculated (This lab value cannot be used to calculate CrCl because it is not a number: <0.30).  No results for input(s): VANCOTROUGH, VANCOPEAK, VANCORANDOM, GENTTROUGH, GENTPEAK, GENTRANDOM, TOBRATROUGH, TOBRAPEAK, TOBRARND, AMIKACINPEAK, AMIKACINTROU, AMIKACIN in the last 72 hours.   Microbiology: Recent Results (from the past 720 hour(s))  Culture, Urine     Status: None   Collection Time: 07/27/17  2:30 PM  Result Value Ref Range Status   Specimen Description URINE, RANDOM  Final   Special Requests NONE  Final   Culture NO GROWTH  Final   Report Status 07/28/2017 FINAL  Final  Culture, respiratory (NON-Expectorated)     Status: None   Collection Time: 07/28/17  9:25 AM  Result Value Ref Range Status   Specimen Description TRACHEAL ASPIRATE  Final   Special Requests NONE  Final   Gram Stain   Final    RARE WBC PRESENT, PREDOMINANTLY MONONUCLEAR NO SQUAMOUS EPITHELIAL CELLS SEEN RARE GRAM POSITIVE COCCI IN PAIRS    Culture FEW PSEUDOMONAS FLUORESCENS  Final   Report Status 08/01/2017 FINAL  Final   Organism ID, Bacteria PSEUDOMONAS FLUORESCENS  Final      Susceptibility   Pseudomonas fluorescens - MIC*    CEFTAZIDIME 4 SENSITIVE Sensitive     CIPROFLOXACIN  <=0.25 SENSITIVE Sensitive     GENTAMICIN <=1 SENSITIVE Sensitive     IMIPENEM <=0.25 SENSITIVE Sensitive     PIP/TAZO <=4 SENSITIVE Sensitive     CEFEPIME 16 INTERMEDIATE Intermediate     * FEW PSEUDOMONAS FLUORESCENS  Culture, blood (routine x 2)     Status: None   Collection Time: 07/31/17  2:19 PM  Result Value Ref Range Status   Specimen Description BLOOD LEFT HAND  Final   Special Requests   Final    BOTTLES DRAWN AEROBIC AND ANAEROBIC Blood Culture adequate volume   Culture NO GROWTH 5 DAYS  Final   Report Status 08/05/2017 FINAL  Final  Culture, blood (routine x 2)     Status: None   Collection Time: 07/31/17  2:19 PM  Result Value Ref Range Status   Specimen Description BLOOD WRIST LEFT  Final   Special Requests   Final    BOTTLES DRAWN AEROBIC AND ANAEROBIC Blood Culture adequate volume   Culture NO GROWTH 5 DAYS  Final   Report Status 08/05/2017 FINAL  Final  Culture, Urine     Status: None   Collection Time: 08/09/17  8:24 AM  Result Value Ref Range Status   Specimen Description URINE, CLEAN CATCH  Final   Special Requests Normal  Final   Culture NO GROWTH  Final   Report Status 08/10/2017 FINAL  Final  Culture, respiratory (NON-Expectorated)     Status: None  Collection Time: 08/09/17  8:24 AM  Result Value Ref Range Status   Specimen Description TRACHEAL ASPIRATE  Final   Special Requests Normal  Final   Gram Stain   Final    RARE WBC PRESENT, PREDOMINANTLY PMN MODERATE GRAM NEGATIVE RODS RARE GRAM POSITIVE COCCI IN PAIRS IN CLUSTERS RARE GRAM POSITIVE RODS    Culture MODERATE ENTEROBACTER AEROGENES  Final   Report Status 08/11/2017 FINAL  Final   Organism ID, Bacteria ENTEROBACTER AEROGENES  Final      Susceptibility   Enterobacter aerogenes - MIC*    CEFAZOLIN >=64 RESISTANT Resistant     CEFEPIME <=1 SENSITIVE Sensitive     CEFTAZIDIME <=1 SENSITIVE Sensitive     CEFTRIAXONE <=1 SENSITIVE Sensitive     CIPROFLOXACIN <=0.25 SENSITIVE Sensitive      GENTAMICIN <=1 SENSITIVE Sensitive     IMIPENEM 0.5 SENSITIVE Sensitive     TRIMETH/SULFA <=20 SENSITIVE Sensitive     PIP/TAZO <=4 SENSITIVE Sensitive     * MODERATE ENTEROBACTER AEROGENES  Culture, blood (routine x 2)     Status: None   Collection Time: 08/09/17 10:05 AM  Result Value Ref Range Status   Specimen Description BLOOD LEFT ARM  Final   Special Requests   Final    BOTTLES DRAWN AEROBIC AND ANAEROBIC Blood Culture adequate volume   Culture NO GROWTH 5 DAYS  Final   Report Status 08/14/2017 FINAL  Final  Culture, blood (routine x 2)     Status: None   Collection Time: 08/09/17 10:21 AM  Result Value Ref Range Status   Specimen Description BLOOD LEFT ARM  Final   Special Requests   Final    BOTTLES DRAWN AEROBIC AND ANAEROBIC Blood Culture adequate volume   Culture NO GROWTH 5 DAYS  Final   Report Status 08/14/2017 FINAL  Final  Culture, blood (Routine x 2)     Status: Abnormal   Collection Time: 08/18/17  9:42 PM  Result Value Ref Range Status   Specimen Description   Final    LEFT ANTECUBITAL Performed at [facility], [address] [place], [place] [address]    Special Requests   Final    BOTTLES DRAWN AEROBIC AND ANAEROBIC Blood Culture adequate volume Performed at [facility], [address], [place], [place] [address]    Culture  Setup Time   Final    GRAM POSITIVE COCCI Gram Stain Report Called to,Read Back By and Verified With: GAMMONS,S. AT 0203 ON 08/20/2017 BY EVA Performed at [facility]    Culture (A)  Final    STAPHYLOCOCCUS SPECIES (COAGULASE NEGATIVE) SUSCEPTIBILITIES PERFORMED ON PREVIOUS CULTURE WITHIN THE LAST 5 DAYS. Performed at [facility], [address], [place], [place] [address]    Report Status 08/22/2017 FINAL  Final  Blood Culture ID Panel (Reflexed)     Status: Abnormal   Collection Time: 08/18/17  9:42 PM  Result Value Ref Range Status   Enterococcus species NOT DETECTED NOT DETECTED Final   Listeria monocytogenes  NOT DETECTED NOT DETECTED Final   Staphylococcus species DETECTED (A) NOT DETECTED Final    Comment: Methicillin (oxacillin) resistant coagulase negative staphylococcus. Possible blood culture contaminant (unless isolated from more than one blood culture draw or clinical case suggests pathogenicity). No antibiotic treatment is indicated for blood  culture contaminants. L. Seay Pharm.D. 7:45 08/20/17 ([email or web address])    Staphylococcus aureus NOT DETECTED NOT DETECTED Final   Methicillin resistance DETECTED (A) NOT DETECTED Final  Comment: CRITICAL RESULT CALLED TO, READ BACK BY AND VERIFIED WITH: L. Seay Pharm.D. 7:45 08/20/17 ([email or web address])    Streptococcus species NOT DETECTED NOT DETECTED Final   Streptococcus agalactiae NOT DETECTED NOT DETECTED Final   Streptococcus pneumoniae NOT DETECTED NOT DETECTED Final   Streptococcus pyogenes NOT DETECTED NOT DETECTED Final   Acinetobacter baumannii NOT DETECTED NOT DETECTED Final   Enterobacteriaceae species NOT DETECTED NOT DETECTED Final   Enterobacter cloacae complex NOT DETECTED NOT DETECTED Final   Escherichia coli NOT DETECTED NOT DETECTED Final   Klebsiella oxytoca NOT DETECTED NOT DETECTED Final   Klebsiella pneumoniae NOT DETECTED NOT DETECTED Final   Proteus species NOT DETECTED NOT DETECTED Final   Serratia marcescens NOT DETECTED NOT DETECTED Final   Haemophilus influenzae NOT DETECTED NOT DETECTED Final   Neisseria meningitidis NOT DETECTED NOT DETECTED Final   Pseudomonas aeruginosa NOT DETECTED NOT DETECTED Final   Candida albicans NOT DETECTED NOT DETECTED Final   Candida glabrata NOT DETECTED NOT DETECTED Final   Candida krusei NOT DETECTED NOT DETECTED Final   Candida parapsilosis NOT DETECTED NOT DETECTED Final   Candida tropicalis NOT DETECTED NOT DETECTED Final    Comment: Performed at [facility], [address] [place], [place] [address]  Urine culture     Status: None   Collection Time: 08/18/17  9:46 PM  Result Value  Ref Range Status   Specimen Description   Final    URINE, CLEAN CATCH Performed at [facility], [address] [place], [place] [address]    Special Requests   Final    NONE Performed at [facility], [address], [place], [place] [address]    Culture   Final    NO GROWTH Performed at [facility], [address] [place], [place] [address]    Report Status 08/20/2017 FINAL  Final  Culture, blood (Routine x 2)     Status: Abnormal   Collection Time: 08/18/17  9:51 PM  Result Value Ref Range Status   Specimen Description   Final    BLOOD LEFT FOREARM Performed at [facility], [address], [place], [place] [address]    Special Requests   Final    BOTTLES DRAWN AEROBIC ONLY Blood Culture adequate volume Performed at [facility], [address], [place], [place] [address]    Culture  Setup Time   Final    GRAM POSITIVE COCCI Gram Stain Report Called to,Read Back By and Verified With: HOWARD,C. AT 2138 ON 08/19/2017 BY EVA AEROBIC BOTTLE ONLY Performed at [facility]    Culture STAPHYLOCOCCUS SPECIES (COAGULASE NEGATIVE) (A)  Final   Report Status 08/22/2017 FINAL  Final   Organism ID, Bacteria STAPHYLOCOCCUS SPECIES (COAGULASE NEGATIVE)  Final      Susceptibility   Staphylococcus species (coagulase negative) - MIC*    CIPROFLOXACIN >=8 RESISTANT Resistant     ERYTHROMYCIN >=8 RESISTANT Resistant     GENTAMICIN >=16 RESISTANT Resistant     OXACILLIN >=4 RESISTANT Resistant     TETRACYCLINE 2 SENSITIVE Sensitive     VANCOMYCIN 1 SENSITIVE Sensitive     TRIMETH/SULFA 80 RESISTANT Resistant     CLINDAMYCIN >=8 RESISTANT Resistant     RIFAMPIN <=0.5 SENSITIVE Sensitive     Inducible Clindamycin NEGATIVE Sensitive     * STAPHYLOCOCCUS SPECIES (COAGULASE NEGATIVE)  Blood Culture ID Panel (Reflexed)     Status: Abnormal   Collection Time: 08/18/17  9:51 PM  Result Value Ref Range Status  Enterococcus species NOT DETECTED NOT DETECTED Final   Listeria  monocytogenes NOT DETECTED NOT DETECTED Final   Staphylococcus species DETECTED (A) NOT DETECTED Final    Comment: Methicillin (oxacillin) resistant coagulase negative staphylococcus. Possible blood culture contaminant (unless isolated from more than one blood culture draw or clinical case suggests pathogenicity). No antibiotic treatment is indicated for blood  culture contaminants. CRITICAL RESULT CALLED TO, READ BACK BY AND VERIFIED WITH: TO SGANNONS(RN) BY TCLEVELAND 08/20/17 AT 5:44AM    Staphylococcus aureus NOT DETECTED NOT DETECTED Final   Methicillin resistance DETECTED (A) NOT DETECTED Final    Comment: CRITICAL RESULT CALLED TO, READ BACK BY AND VERIFIED WITH: TO SGANNONS(RN) BY TCLEVELAND 08/20/17 AT 5:44AM    Streptococcus species NOT DETECTED NOT DETECTED Final   Streptococcus agalactiae NOT DETECTED NOT DETECTED Final   Streptococcus pneumoniae NOT DETECTED NOT DETECTED Final   Streptococcus pyogenes NOT DETECTED NOT DETECTED Final   Acinetobacter baumannii NOT DETECTED NOT DETECTED Final   Enterobacteriaceae species NOT DETECTED NOT DETECTED Final   Enterobacter cloacae complex NOT DETECTED NOT DETECTED Final   Escherichia coli NOT DETECTED NOT DETECTED Final   Klebsiella oxytoca NOT DETECTED NOT DETECTED Final   Klebsiella pneumoniae NOT DETECTED NOT DETECTED Final   Proteus species NOT DETECTED NOT DETECTED Final   Serratia marcescens NOT DETECTED NOT DETECTED Final   Haemophilus influenzae NOT DETECTED NOT DETECTED Final   Neisseria meningitidis NOT DETECTED NOT DETECTED Final   Pseudomonas aeruginosa NOT DETECTED NOT DETECTED Final   Candida albicans NOT DETECTED NOT DETECTED Final   Candida glabrata NOT DETECTED NOT DETECTED Final   Candida krusei NOT DETECTED NOT DETECTED Final   Candida parapsilosis NOT DETECTED NOT DETECTED Final   Candida tropicalis NOT DETECTED NOT DETECTED Final    Comment: Performed at [facility], [address] [place], [place]  [address]  MRSA PCR Screening     Status: None   Collection Time: 08/19/17  1:09 AM  Result Value Ref Range Status   MRSA by PCR NEGATIVE NEGATIVE Final    Comment:        The GeneXpert MRSA Assay (FDA approved for NASAL specimens only), is one component of a comprehensive MRSA colonization surveillance program. It is not intended to diagnose MRSA infection nor to guide or monitor treatment for MRSA infections. Performed at [facility], [address] [place], [place] [address]   Respiratory Panel by PCR     Status: None   Collection Time: 08/19/17  1:55 AM  Result Value Ref Range Status   Adenovirus NOT DETECTED NOT DETECTED Final   Coronavirus 229E NOT DETECTED NOT DETECTED Final   Coronavirus HKU1 NOT DETECTED NOT DETECTED Final   Coronavirus NL63 NOT DETECTED NOT DETECTED Final   Coronavirus OC43 NOT DETECTED NOT DETECTED Final   Metapneumovirus NOT DETECTED NOT DETECTED Final   Rhinovirus / Enterovirus NOT DETECTED NOT DETECTED Final   Influenza A NOT DETECTED NOT DETECTED Final   Influenza B NOT DETECTED NOT DETECTED Final   Parainfluenza Virus 1 NOT DETECTED NOT DETECTED Final   Parainfluenza Virus 2 NOT DETECTED NOT DETECTED Final   Parainfluenza Virus 3 NOT DETECTED NOT DETECTED Final   Parainfluenza Virus 4 NOT DETECTED NOT DETECTED Final   Respiratory Syncytial Virus NOT DETECTED NOT DETECTED Final   Bordetella pertussis NOT DETECTED NOT DETECTED Final   Chlamydophila pneumoniae NOT DETECTED NOT DETECTED Final   Mycoplasma pneumoniae NOT DETECTED NOT DETECTED Final    Comment: Performed  at [facility], [address], [place], [place] [address]  Culture, respiratory (NON-Expectorated)     Status: None   Collection Time: 08/19/17  2:40 AM  Result Value Ref Range Status   Specimen Description   Final    TRACHEAL ASPIRATE Performed at [facility], [address] [place], [place] [address]    Special Requests   Final    NONE Performed at [facility],  [address], [place], [place] [address]    Gram Stain   Final    ABUNDANT WBC PRESENT, PREDOMINANTLY PMN RARE SQUAMOUS EPITHELIAL CELLS PRESENT ABUNDANT GRAM POSITIVE COCCI IN PAIRS IN CLUSTERS FEW GRAM POSITIVE RODS FEW GRAM NEGATIVE RODS    Culture   Final    Consistent with normal respiratory flora. Performed at [facility], [address] [place], [place] [address]    Report Status 08/21/2017 FINAL  Final  Culture, blood (Routine X 2) w Reflex to ID Panel     Status: None (Preliminary result)   Collection Time: 08/20/17  7:51 AM  Result Value Ref Range Status   Specimen Description BLOOD LEFT HAND  Final   Special Requests   Final    BOTTLES DRAWN AEROBIC AND ANAEROBIC Blood Culture adequate volume   Culture   Final    NO GROWTH 3 DAYS Performed at [facility], [address] [place], [place] [address]    Report Status PENDING  Incomplete  Culture, blood (Routine X 2) w Reflex to ID Panel     Status: None (Preliminary result)   Collection Time: 08/20/17  7:52 AM  Result Value Ref Range Status   Specimen Description BLOOD LEFT WRIST  Final   Special Requests   Final    BOTTLES DRAWN AEROBIC AND ANAEROBIC Blood Culture adequate volume   Culture   Final    NO GROWTH 3 DAYS Performed at [facility], [address] [place], [place] [address]    Report Status PENDING  Incomplete    Medical History: Past Medical History:  Diagnosis Date  . Atrial premature beats   . Blood in stool   . Cerebral aneurysm   . Coronary atherosclerosis of native coronary artery   . Embolism (HCC)    History of right lower extremity distal embolism   . Gout   . Hyperglycemia   . Hyperlipidemia, mixed   . Osteoarthritis   . Plantar fasciitis   . Sinusitis   . Stroke (HCC)   . Subarachnoid hemorrhage (HCC)   . Unspecified essential hypertension     Medications:  See medication history Assessment: Patient sent from nursing home with fever of 101 axillary tachycardia tachypnea and  oxygen sats in the 70s.  He is chronically trached and pegged. Pneumonia on x-ray.  2 BC are positive for MRSE. Plan:  vancomycin 1000 mg IV q12 hours Meropenem 1gm IV q8 hours Vanco trough 2/7 1730 F/u renal function, cultures and clinical course  Tad Moore, RPH 08/23/2017,11:52 AM

## 2017-08-23 NOTE — Evaluation (Signed)
Passy-Muir Speaking Valve - Evaluation Patient Details  Name: Richard Mcpherson MRN: [id number] Date of Birth: Mar 15, 1941  Today's Date: 08/23/2017 Time: 7482-7078 SLP Time Calculation (min) (ACUTE ONLY): 36 min  Past Medical History:  Past Medical History:  Diagnosis Date  . Atrial premature beats   . Blood in stool   . Cerebral aneurysm   . Coronary atherosclerosis of native coronary artery   . Embolism (HCC)    History of right lower extremity distal embolism   . Gout   . Hyperglycemia   . Hyperlipidemia, mixed   . Osteoarthritis   . Plantar fasciitis   . Sinusitis   . Stroke (HCC)   . Subarachnoid hemorrhage (HCC)   . Unspecified essential hypertension    Past Surgical History:  Past Surgical History:  Procedure Laterality Date  . APPENDECTOMY    . cardiac catherization    . CATARACT EXTRACTION W/PHACO Right 02/19/2014   Procedure: CATARACT EXTRACTION PHACO AND INTRAOCULAR LENS PLACEMENT (IOC);  Surgeon: Gemma Payor, MD;  Location: [facility];  Service: Ophthalmology;  Laterality: Right;  CDE 12.71  . CATARACT EXTRACTION W/PHACO Left 12/28/2014   Procedure: CATARACT EXTRACTION PHACO AND INTRAOCULAR LENS PLACEMENT (IOC);  Surgeon: Gemma Payor, MD;  Location: [facility];  Service: Ophthalmology;  Laterality: Left;  CDE:8.05  . embolectomy 2000    . IR GASTROSTOMY TUBE MOD SED  08/06/2017   HPI:      Assessment / Plan / Recommendation Clinical Impression  Pt demonstrated adequate tolerance of cuff deflation and then donning of PMSV. Vital signs remained generally unchanged with O2 sats down to 93 (but maintained). Pt eventually able to vocalize productions of vowels after imitation. He occasionally responded appropriately to questions with reduced intelligibility and single word responses. Palliative care team requested PMSV evaluation and cognitive linguistic evaluation. Cognitive linguistic evaluation will be difficult to complete due to limited verbal communication despite PMSV and motor  deficits. Pt was able to follow basic commands related to oral motor movements, respond to yes/no questions ~60% of the time, and use eye gaze for communication at times. Pt showed signs of fatigue and reduced attention and would close his eyes briefly to disengage. SLP provided education to spouse Dennie Bible) about benefits of using PMSV if Pt safely tolerating (to redirect air through upper airway). It sounds as if PMSV was trialed at [facility], but only donned a couple of times due to Pt "not voicing". Pt also noted to swallow spontaneously every ~2-3 minutes this session. RT advised SLP to leave cuff deflated after removing PMSV (this was placed bach in case and placed in top drawer of Pt's dresser and should only be donned by SLP at this time), as goal is for Pt to be changed to cuffless trach tomorrow if possible.  SLP will follow during acute setting and recommend f/u SLP services at next venue of care.  SLP Visit Diagnosis: Cognitive communication deficit (R41.841);Aphonia (R49.1)    SLP Assessment  Patient needs continued Speech Lanaguage Pathology Services    Follow Up Recommendations  Skilled Nursing facility    Frequency and Duration min 2x/week  1 week    PMSV Trial PMSV was placed for: 35 Able to redirect subglottic air through upper airway: Yes Able to Attain Phonation: Yes Voice Quality: Low vocal intensity Able to Expectorate Secretions: No Level of Secretion Expectoration with PMSV: Tracheal Breath Support for Phonation: Inadequate Intelligibility: Unable to assess (comment) Respirations During Trial: 30 SpO2 During Trial: 96 % Pulse During Trial: 79 Behavior:  Alert   Tracheostomy Tube       Vent Dependency  FiO2 (%): 28 %    Cuff Deflation Trial   Thank you,  Havery Moros, CCC-SLP [phone]  Tolerated Cuff Deflation: Yes Length of Time for Cuff Deflation Trial: 5 minutes Behavior: Alert;Good eye contact        PORTER,DABNEY 08/23/2017, 7:34 PM

## 2017-08-23 NOTE — Progress Notes (Signed)
Nutrition Follow up  INTERVENTION:  Increase Vital 1.2 @ 65 ml/hr via PEG (1560 ml every 24 hr).   Tube feeding regimen provides 1872 kcal, 117 grams of protein, and 1265 ml of H2O.    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 Unable to maintain PO orally necessciating PEG placement . Has been NPO since CVA in December. ) as evidenced by NPO status, per patient/family report, percent weight loss 8% < 90 days. Moderate muscle and fat depletions.  GOAL:   Provide needs based on ASPEN/SCCM guidelines MONITOR:   TF tolerance, Skin, Weight trends, Labs, I & O's  REASON FOR ASSESSMENT:   Consult Enteral/tube feeding initiation and management  ASSESSMENT:  Patient was admitted to [facility] on Friday from [facility]).    Has a hx of CVA and is s/p Trach and PEG placement following what initially started as a  fall at home.   The patient is sedated -spouse is here and helped with pt hx. He has been NPO since CVA in December. According to care everywhere he was discharged to [facility] with Isosource HN @ 70 ml /hr continuous feeding.   He presents to [facility] with bacteremia and PNA. His trach has been changed to cuffed trach tube and has been on the vent since yesterday.  According to the chart he weighed 179 lb on December 17th. Comparing the two- his current weight shows a significant decrease of 8% < 90 days.      Labs: BUN 34 and Mag 1.9 , Phos 3.2  Meds: Omega 3 and miralax.  Propofol: discontinued  IVF-0.45  NaCl + KCL (20 mEq/l)  @ 75 ml /hr  VENT MV: 11.6  L/min Temp (24hrs), Avg:98.7 F (37.1 C), Min:98 F (36.7 C), Max:99.7 F (37.6 C)   Recent Labs  Lab 08/20/17 0405 08/21/17 1258 08/22/17 0531  NA 144 141 139  K 3.7 3.4* 3.2*  CL 108 110 107  CO2 23 23 24   BUN 34* 31* 27*  CREATININE 0.41* 0.30* <0.30*  CALCIUM 8.7* 8.3* 8.4*  MG 1.9  --   --   PHOS 3.2  --    --   GLUCOSE 93 112* 108*     NUTRITION - FOCUSED PHYSICAL EXAM: He has moderate muscle loss to temporalis, clavicles and deltoids and moderate buccal, orbital, pectorial fat depletion. NO edema to lower extremities but has moderate edema bilateral hands.    Diet Order:  Diet NPO time specified  EDUCATION NEEDS:    Skin:  Stage III to coccyx per nursing   Last BM:   today  Height:   Ht Readings from Last 1 Encounters:  08/19/17 6' (1.829 m)    Weight:   Wt Readings from Last 1 Encounters:  08/23/17 181 lb 14.1 oz (82.5 kg)    Ideal Body Weight:  81 kg  BMI:  Body mass index is 24.67 kg/m.  Estimated Nutritional Needs:   Kcal:  1818  Protein:  98-113 gr  Fluid:  >1800 ml daily   Royann Shivers MS,RD,CSG,LDN Office: [phone] Pager: [phone]

## 2017-08-23 NOTE — Progress Notes (Signed)
PROGRESS NOTE    Richard Mcpherson  GBT:[id number] DOB: 1941-07-07 DOA: 08/18/2017 PCP: [name], MD     Brief Narrative:  77 year old man admitted from SNF on 2/2 with complaints of fever and chills.  He has a very complex medical history.  I have reviewed his recent hospitalization from [place] in detail.  Was initially admitted to [facility] rocking him on 06/19/2017 after an unwitnessed fall and was noted to have acute mental status changes with right hemiparesis, from there was transferred to [facility] he was found to have an acute CVA for which she received IV TPA.  He then had a prolonged hospitalization with numerous complications.  Subsequently noted to have a subdural hematoma on MRI.  He had cardiac arrest with asystole which was felt to be due to to his cervical spine contusion, taken to surgery on 12/19 for an occipital to C6 fusion with C1, C2 and C3 laminectomies.  Was able to be extubated but required reintubation on 12/19.  Hospitalization was further complicated by right axillary vein DVT resulting from a right upper extremity PICC line.  He was started on a heparin drip.  He had a repeat MRI of the brain on 12/29 that showed resolving subdural hematoma.  Subsequently developed Enterobacter ventilator associated pneumonia treated with Zosyn, unfortunately he continues to have fevers and difficulty weaning from the ventilator.  Had a tracheostomy and gastrostomy tube placed during his hospitalization.  [facility] where he was weaned off the ventilator and discharged to skilled nursing facility on 08/17/17.  He was subsequently sent back to our hospital on 2/2 with fevers.  Here he was started on vancomycin and Zosyn initially, 2 out of 2 blood cultures have grown out coag negative staph with methicillin resistance, he continues on vancomycin.  Repeat blood cultures on 2/4 are negative.  He was able to be weaned from the ventilator today to  trach collar.  He remains nonverbal, cannot follow commands.  Palliative care is involved.   Assessment & Plan:   Principal Problem:   Sepsis ([place]) Active Problems:   Hyperlipidemia   Essential hypertension   CAD, NATIVE VESSEL   Stroke (cerebrum) (HCC)   SAH (subarachnoid hemorrhage) (HCC)   Pneumonia   Acute hypoxemic respiratory failure (HCC)   Acute on chronic respiratory failure with hypoxia (HCC)   Sepsis due to undetermined organism ([place])   Aspiration pneumonia of both lower lobes due to gastric secretions (HCC)   Tracheostomy status ([place])   Deep vein thrombosis (DVT) of right upper extremity (HCC)   Transaminasemia   Lobar pneumonia ([place])   Sacral decubitus ulcer, stage III (HCC)   Staphylococcal sepsis ([place])   Healthcare-associated pneumonia   Malnutrition of moderate degree   Palliative care encounter   Sepsis -Secondary to pneumonia and coag negative staph bacteremia. -Continue meropenem and vancomycin as there is concern for aspiration. -Reviewed echo report from 08/20/17: EF 65-70%, no wall motion abnormalities, no vegetations.  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 -Patient was started on mechanical ventilation on 2/3 due to respiratory distress and worsening hypoxemia, was able to be extubated on 2/6. -Dr. Luan Pulling following. -CT scan of the chest without PE but significant left lower lobe infiltrate and consolidation. -Continue meropenem as well as vancomycin.  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 -He remains nonverbal and unable to follow commands.  Stage III sacral decubitus ulcer -Wound care following.  Severe protein caloric malnutrition -Continue enteral feedings and supplements as per  dietitian recommendations.  Oral thrush -We will start IV fluconazole for 5 days.   DVT prophylaxis: Eliquis Code Status: Full code Family Communication: Discussed in detail with  daughter at bedside, wife was not present at time of my exam. Disposition Plan: Keep in ICU today.  Continue palliative care discussions given very poor short and long-term prognosis.  Consultants:   Pulmonology  Palliative care  Procedures:   Echo as above  Antimicrobials:  Anti-infectives (From admission, onward)   Start     Dose/Rate Route Frequency Ordered Stop   08/23/17 1800  fluconazole (DIFLUCAN) IVPB 100 mg     100 mg 50 mL/hr over 60 Minutes Intravenous Every 24 hours 08/23/17 1649 08/28/17 1759   08/20/17 2030  vancomycin (VANCOCIN) IVPB 1000 mg/200 mL premix     1,000 mg 200 mL/hr over 60 Minutes Intravenous Every 12 hours 08/20/17 0834     08/20/17 0830  vancomycin (VANCOCIN) 1,500 mg in sodium chloride 0.9 % 500 mL IVPB     1,500 mg 250 mL/hr over 120 Minutes Intravenous  Once 08/20/17 0818 08/20/17 1125   08/19/17 1800  vancomycin (VANCOCIN) IVPB 750 mg/150 ml premix  Status:  Discontinued     750 mg 150 mL/hr over 60 Minutes Intravenous Every 12 hours 08/19/17 1026 08/19/17 1425   08/19/17 0900  meropenem (MERREM) 2 g in sodium chloride 0.9 % 100 mL IVPB  Status:  Discontinued     2 g 200 mL/hr over 30 Minutes Intravenous Every 8 hours 08/19/17 0837 08/19/17 0839   08/19/17 0900  meropenem (MERREM) 1 g in sodium chloride 0.9 % 100 mL IVPB     1 g 200 mL/hr over 30 Minutes Intravenous Every 8 hours 08/19/17 0839     08/19/17 0600  piperacillin-tazobactam (ZOSYN) IVPB 3.375 g  Status:  Discontinued     3.375 g 12.5 mL/hr over 240 Minutes Intravenous Every 8 hours 08/18/17 2316 08/19/17 0825   08/19/17 0600  vancomycin (VANCOCIN) IVPB 1000 mg/200 mL premix     1,000 mg 200 mL/hr over 60 Minutes Intravenous  Once 08/18/17 2318 08/19/17 0610   08/18/17 2200  piperacillin-tazobactam  (ZOSYN) IVPB 3.375 g     3.375 g 100 mL/hr over 30 Minutes Intravenous  Once 08/18/17 2159 08/18/17 2252   08/18/17 2200  vancomycin (VANCOCIN) IVPB 1000 mg/200 mL premix     1,000 mg 200 mL/hr over 60 Minutes Intravenous  Once 08/18/17 2159 08/18/17 2307       Subjective: Lying in bed, nonverbal, unable to follow commands.  Objective: Vitals:   08/23/17 1108 08/23/17 1200 08/23/17 1539 08/23/17 1546  BP:  118/61    Pulse: 81 76  82  Resp: (!) 27 (!) 31  (!) 29  Temp: 98 F (36.7 C)   97.9 F (36.6 C)  TempSrc: Oral   Oral  SpO2: 99% 97% 99% 98%  Weight:      Height:        Intake/Output Summary (Last 24 hours) at 08/23/2017 1650 Last data filed at 08/23/2017 0953 Gross per 24 hour  Intake -  Output 1350 ml  Net -1350 ml   Filed Weights   08/21/17 0500 08/22/17 0500 08/23/17 0500  Weight: 81.8 kg (180 lb 5.4 oz) 81 kg (178  lb 9.2 oz) 82.5 kg (181 lb 14.1 oz)    Examination:  General exam: Awake, unable to assess orientation as he is nonverbal Respiratory system: Decreased breath sounds left base, coarse breath sounds diffusely Cardiovascular system:RRR. No murmurs, rubs, gallops. Gastrointestinal system: Abdomen is nondistended, soft and nontender. No organomegaly or masses felt. Normal bowel sounds heard. Central nervous system: Unable to assess given current mental state Extremities: No C/C/E, +pedal pulses Psychiatry: Unable to assess as he is nonverbal     Data Reviewed: I have personally reviewed following labs and imaging studies  CBC: Recent Labs  Lab 08/18/17 2145 08/19/17 0450 08/20/17 0405 08/21/17 1258 08/22/17 0531  WBC 19.4* 21.9* 20.9* 12.5* 9.5  NEUTROABS 16.9  --   --   --   --   HGB 11.8* 10.7* 9.8* 8.4* 8.2*  HCT 39.7 36.7* 33.5* 28.8* 27.5*  MCV 87.6 88.2 89.1 87.8 86.8  PLT 348 298 263 224 578   Basic Metabolic Panel: Recent Labs  Lab 08/18/17 2145 08/19/17 0450 08/20/17 0405 08/21/17 1258 08/22/17 0531  NA 145 146* 144 141  139  K 4.1 3.6 3.7 3.4* 3.2*  CL 109 112* 108 110 107  CO2 _0 GLUCOSE 147* 129* 93 112* 108*  BUN 39* 33* 34* 31* 27*  CREATININE 0.54* 0.39* 0.41* 0.30* <0.30*  CALCIUM 9.0 8.9 8.7* 8.3* 8.4*  MG  --   --  1.9  --   --   PHOS  --   --  3.2  --   --    GFR: CrCl cannot be calculated (This lab value cannot be used to calculate CrCl because it is not a number: <0.30). Liver Function Tests: Recent Labs  Lab 08/18/17 2145 08/20/17 0405 08/21/17 1258  AST 55* 24 52*  ALT 97* 50 56  ALKPHOS 414* 260* 279*  BILITOT 0.6 1.2 0.9  PROT 7.3 6.1* 5.6*  ALBUMIN 2.3* 1.9* 1.6*   No results for input(s): LIPASE, AMYLASE in the last 168 hours. No results for input(s): AMMONIA in the last 168 hours. Coagulation Profile: Recent Labs  Lab 08/17/17 0548 08/18/17 2145  INR 1.20 1.37   Cardiac Enzymes: No results for input(s): CKTOTAL, CKMB, CKMBINDEX, TROPONINI in the last 168 hours. BNP (last 3 results) No results for input(s): PROBNP in the last 8760 hours. HbA1C: No results for input(s): HGBA1C in the last 72 hours. CBG: Recent Labs  Lab 08/19/17 0736 08/21/17 1608  GLUCAP 135* 120*   Lipid Profile: Recent Labs    08/22/17 1453  TRIG 144   Thyroid Function Tests: No results for input(s): TSH, T4TOTAL, FREET4, T3FREE, THYROIDAB in the last 72 hours. Anemia Panel: No results for input(s): VITAMINB12, FOLATE, FERRITIN, TIBC, IRON, RETICCTPCT in the last 72 hours. Urine analysis:    Component Value Date/Time   COLORURINE AMBER (A) 08/18/2017 2146   APPEARANCEUR HAZY (A) 08/18/2017 2146   LABSPEC 1.025 08/18/2017 2146   PHURINE 6.0 08/18/2017 2146   GLUCOSEU NEGATIVE 08/18/2017 2146   HGBUR MODERATE (A) 08/18/2017 2146   BILIRUBINUR NEGATIVE 08/18/2017 2146   [place] NEGATIVE 08/18/2017 2146   PROTEINUR 30 (A) 08/18/2017 2146   NITRITE NEGATIVE 08/18/2017 2146   LEUKOCYTESUR NEGATIVE 08/18/2017 2146   Sepsis  Labs: _1 (procalcitonin:4,lacticidven:4)  ) Recent Results (from the past 240 hour(s))  Culture, blood (Routine x 2)     Status: Abnormal   Collection Time: 08/18/17  9:42 PM  Result Value Ref Range Status   Specimen Description  Final    LEFT ANTECUBITAL Performed at [facility], [address], [place], [place] [address]    Special Requests   Final    BOTTLES DRAWN AEROBIC AND ANAEROBIC Blood Culture adequate volume Performed at [facility], [address], [place], [place] [address]    Culture  Setup Time   Final    GRAM POSITIVE COCCI Gram Stain Report Called to,Read Back By and Verified With: GAMMONS,S. AT 0203 ON 08/20/2017 BY EVA Performed at [facility]    Culture (A)  Final    STAPHYLOCOCCUS SPECIES (COAGULASE NEGATIVE) SUSCEPTIBILITIES PERFORMED ON PREVIOUS CULTURE WITHIN THE LAST 5 DAYS. Performed at [facility], [place] [address], [place], [place] [address]    Report Status 08/22/2017 FINAL  Final  Blood Culture ID Panel (Reflexed)     Status: Abnormal   Collection Time: 08/18/17  9:42 PM  Result Value Ref Range Status   Enterococcus species NOT DETECTED NOT DETECTED Final   Listeria monocytogenes NOT DETECTED NOT DETECTED Final   Staphylococcus species DETECTED (A) NOT DETECTED Final    Comment: Methicillin (oxacillin) resistant coagulase negative staphylococcus. Possible blood culture contaminant (unless isolated from more than one blood culture draw or clinical case suggests pathogenicity). No antibiotic treatment is indicated for blood  culture contaminants. L. Seay Pharm.D. 7:45 08/20/17 (wilsonm)    Staphylococcus aureus NOT DETECTED NOT DETECTED Final   Methicillin resistance DETECTED (A) NOT DETECTED Final    Comment: CRITICAL RESULT CALLED TO, READ BACK BY AND VERIFIED WITH: L. Seay Pharm.D. 7:45 08/20/17 (wilsonm)    Streptococcus species NOT DETECTED NOT DETECTED Final   Streptococcus agalactiae NOT DETECTED NOT DETECTED Final    Streptococcus pneumoniae NOT DETECTED NOT DETECTED Final   Streptococcus pyogenes NOT DETECTED NOT DETECTED Final   Acinetobacter baumannii NOT DETECTED NOT DETECTED Final   Enterobacteriaceae species NOT DETECTED NOT DETECTED Final   Enterobacter cloacae complex NOT DETECTED NOT DETECTED Final   Escherichia coli NOT DETECTED NOT DETECTED Final   Klebsiella oxytoca NOT DETECTED NOT DETECTED Final   Klebsiella pneumoniae NOT DETECTED NOT DETECTED Final   Proteus species NOT DETECTED NOT DETECTED Final   Serratia marcescens NOT DETECTED NOT DETECTED Final   Haemophilus influenzae NOT DETECTED NOT DETECTED Final   Neisseria meningitidis NOT DETECTED NOT DETECTED Final   Pseudomonas aeruginosa NOT DETECTED NOT DETECTED Final   Candida albicans NOT DETECTED NOT DETECTED Final   Candida glabrata NOT DETECTED NOT DETECTED Final   Candida krusei NOT DETECTED NOT DETECTED Final   Candida parapsilosis NOT DETECTED NOT DETECTED Final   Candida tropicalis NOT DETECTED NOT DETECTED Final    Comment: Performed at [facility], [place] [address], [place], [place] [address]  Urine culture     Status: None   Collection Time: 08/18/17  9:46 PM  Result Value Ref Range Status   Specimen Description   Final    URINE, CLEAN CATCH Performed at [facility], [address], [place], [place] [address]    Special Requests   Final    NONE Performed at [facility], [address], [place], [place] [address]    Culture   Final    NO GROWTH Performed at [facility], [place] [address] [place], [place] [address]    Report Status 08/20/2017 FINAL  Final  Culture, blood (Routine x 2)     Status: Abnormal   Collection Time: 08/18/17  9:51 PM  Result Value Ref Range Status  Specimen Description   Final    BLOOD LEFT FOREARM Performed at [facility], [address], [place], [place] [address]    Special Requests   Final    BOTTLES DRAWN AEROBIC ONLY Blood Culture adequate volume Performed at [facility], [address] [place], [place] [address]    Culture  Setup Time   Final    GRAM POSITIVE COCCI Gram Stain Report Called to,Read Back By and Verified With: HOWARD,C. AT 2138 ON 08/19/2017 BY EVA AEROBIC BOTTLE ONLY Performed at [place] (COAGULASE NEGATIVE) (A)  Final   Report Status 08/22/2017 FINAL  Final   Organism ID, Bacteria STAPHYLOCOCCUS SPECIES (COAGULASE NEGATIVE)  Final      Susceptibility   Staphylococcus species (coagulase negative) - MIC*    CIPROFLOXACIN >=8 RESISTANT Resistant     ERYTHROMYCIN >=8 RESISTANT Resistant     GENTAMICIN >=16 RESISTANT Resistant     OXACILLIN >=4 RESISTANT Resistant     TETRACYCLINE 2 SENSITIVE Sensitive     VANCOMYCIN 1 SENSITIVE Sensitive     TRIMETH/SULFA 80 RESISTANT Resistant     CLINDAMYCIN >=8 RESISTANT Resistant     RIFAMPIN <=0.5 SENSITIVE Sensitive     Inducible Clindamycin NEGATIVE Sensitive     * STAPHYLOCOCCUS SPECIES (COAGULASE NEGATIVE)  Blood Culture ID Panel (Reflexed)     Status: Abnormal   Collection Time: 08/18/17  9:51 PM  Result Value Ref Range Status   Enterococcus species NOT DETECTED NOT DETECTED Final   Listeria monocytogenes NOT DETECTED NOT DETECTED Final   Staphylococcus species DETECTED (A) NOT DETECTED Final    Comment: Methicillin (oxacillin) resistant coagulase negative staphylococcus. Possible blood culture contaminant (unless isolated from more than one blood culture draw or clinical case suggests pathogenicity). No antibiotic treatment is indicated for blood  culture contaminants. CRITICAL RESULT CALLED TO, READ BACK BY AND VERIFIED WITH: TO SGANNONS(RN) BY TCLEVELAND 08/20/17 AT 5:44AM    Staphylococcus aureus NOT DETECTED NOT DETECTED Final   Methicillin resistance DETECTED (A) NOT DETECTED Final    Comment: CRITICAL RESULT CALLED TO, READ BACK BY AND VERIFIED WITH: TO SGANNONS(RN) BY TCLEVELAND 08/20/17 AT 5:44AM    Streptococcus species NOT  DETECTED NOT DETECTED Final   Streptococcus agalactiae NOT DETECTED NOT DETECTED Final   Streptococcus pneumoniae NOT DETECTED NOT DETECTED Final   Streptococcus pyogenes NOT DETECTED NOT DETECTED Final   Acinetobacter baumannii NOT DETECTED NOT DETECTED Final   Enterobacteriaceae species NOT DETECTED NOT DETECTED Final   Enterobacter cloacae complex NOT DETECTED NOT DETECTED Final   Escherichia coli NOT DETECTED NOT DETECTED Final   Klebsiella oxytoca NOT DETECTED NOT DETECTED Final   Klebsiella pneumoniae NOT DETECTED NOT DETECTED Final   Proteus species NOT DETECTED NOT DETECTED Final   Serratia marcescens NOT DETECTED NOT DETECTED Final   Haemophilus influenzae NOT DETECTED NOT DETECTED Final   Neisseria meningitidis NOT DETECTED NOT DETECTED Final   Pseudomonas aeruginosa NOT DETECTED NOT DETECTED Final   Candida albicans NOT DETECTED NOT DETECTED Final   Candida glabrata NOT DETECTED NOT DETECTED Final   Candida krusei NOT DETECTED NOT DETECTED Final   Candida parapsilosis NOT DETECTED NOT DETECTED Final   Candida tropicalis NOT DETECTED NOT DETECTED Final    Comment: Performed at [facility], [place]. [address], [place], [place] [address]  MRSA PCR Screening     Status: None   Collection Time: 08/19/17  1:09 AM  Result Value Ref Range Status  MRSA by PCR NEGATIVE NEGATIVE Final    Comment:        The GeneXpert MRSA Assay (FDA approved for NASAL specimens only), is one component of a comprehensive MRSA colonization surveillance program. It is not intended to diagnose MRSA infection nor to guide or monitor treatment for MRSA infections. Performed at [facility], [address], [place], [place] [address]   Respiratory Panel by PCR     Status: None   Collection Time: 08/19/17  1:55 AM  Result Value Ref Range Status   Adenovirus NOT DETECTED NOT DETECTED Final   Coronavirus 229E NOT DETECTED NOT DETECTED Final   Coronavirus HKU1 NOT DETECTED NOT DETECTED Final    Coronavirus NL63 NOT DETECTED NOT DETECTED Final   Coronavirus OC43 NOT DETECTED NOT DETECTED Final   Metapneumovirus NOT DETECTED NOT DETECTED Final   Rhinovirus / Enterovirus NOT DETECTED NOT DETECTED Final   Influenza A NOT DETECTED NOT DETECTED Final   Influenza B NOT DETECTED NOT DETECTED Final   Parainfluenza Virus 1 NOT DETECTED NOT DETECTED Final   Parainfluenza Virus 2 NOT DETECTED NOT DETECTED Final   Parainfluenza Virus 3 NOT DETECTED NOT DETECTED Final   Parainfluenza Virus 4 NOT DETECTED NOT DETECTED Final   Respiratory Syncytial Virus NOT DETECTED NOT DETECTED Final   Bordetella pertussis NOT DETECTED NOT DETECTED Final   Chlamydophila pneumoniae NOT DETECTED NOT DETECTED Final   Mycoplasma pneumoniae NOT DETECTED NOT DETECTED Final    Comment: Performed at [facility], [place] [address] [place], [place] [address]  Culture, respiratory (NON-Expectorated)     Status: None   Collection Time: 08/19/17  2:40 AM  Result Value Ref Range Status   Specimen Description   Final    TRACHEAL ASPIRATE Performed at [facility], [address] [place], [place] [address]    Special Requests   Final    NONE Performed at [facility], [address] [place], [place] [address]    Gram Stain   Final    ABUNDANT WBC PRESENT, PREDOMINANTLY PMN RARE SQUAMOUS EPITHELIAL CELLS PRESENT ABUNDANT GRAM POSITIVE COCCI IN PAIRS IN CLUSTERS FEW GRAM POSITIVE RODS FEW GRAM NEGATIVE RODS    Culture   Final    Consistent with normal respiratory flora. Performed at [facility], [place] [address] [place], [place] [address]    Report Status 08/21/2017 FINAL  Final  Culture, blood (Routine X 2) w Reflex to ID Panel     Status: None (Preliminary result)   Collection Time: 08/20/17  7:51 AM  Result Value Ref Range Status   Specimen Description BLOOD LEFT HAND  Final   Special Requests   Final    BOTTLES DRAWN AEROBIC AND ANAEROBIC Blood Culture adequate volume   Culture   Final    NO  GROWTH 3 DAYS Performed at [facility], [address], [place], [place] [address]    Report Status PENDING  Incomplete  Culture, blood (Routine X 2) w Reflex to ID Panel     Status: None (Preliminary result)   Collection Time: 08/20/17  7:52 AM  Result Value Ref Range Status   Specimen Description BLOOD LEFT WRIST  Final   Special Requests   Final    BOTTLES DRAWN AEROBIC AND ANAEROBIC Blood Culture adequate volume   Culture   Final    NO GROWTH 3 DAYS Performed at [facility], [address] [place],  [address]    Report Status PENDING  Incomplete  Radiology Studies: Ct Chest Wo Contrast  Result Date: 08/22/2017 CLINICAL DATA:  Respiratory failure.  Former smoker. EXAM: CT CHEST WITHOUT CONTRAST TECHNIQUE: Multidetector CT imaging of the chest was performed following the standard protocol without IV contrast. COMPARISON:  None. FINDINGS: Cardiovascular: Heart size appears normal. Aortic atherosclerosis noted. Calcifications within the left circumflex, lad coronary arteries noted. Mediastinum/Nodes: The trachea appears patent and midline. Tracheostomy tube tip is above the carina. Normal appearance of the esophagus. Subcarinal lymph node is enlarged measuring 1.4 cm. No axillary or supraclavicular adenopathy. Evaluation of the hilar nodes limited due to lack of IV contrast material. Lungs/Pleura: Trace left pleural effusion. Moderate change of emphysema. There is dense airspace consolidation with air bronchograms involving much of the left lower lobe. Mild subsegmental atelectasis and subpleural consolidation noted in the posterior right lower lobe. Scattered small ground-glass and tree-in-bud nodules identified within the left upper lobe. Calcified granuloma identified within the right upper lobe. Small noncalcified right middle lobe lung nodule measures 3 mm, image 91 of series 4. Upper Abdomen: No acute abnormality. Musculoskeletal: No chest wall mass or suspicious bone  lesions identified. IMPRESSION: 1. There is dense airspace consolidation involving much of the left lower lobe. Findings are compatible with pneumonia and/or aspiration. Followup imaging following appropriate antibiotic therapy is advised to ensure resolution. In the absence of resolution there should be consideration for underlying pulmonary malignancy. 2. Small scattered ground-glass and tree-in-bud nodules in the left upper lobe are likely inflammatory in etiology. 3. Enlarged subcarinal lymph node may be reactive in the setting of pneumonia. Attention on follow-up imaging is advised. 4. Aortic Atherosclerosis (ICD10-I70.0) and Emphysema (ICD10-J43.9). Atherosclerotic calcifications in the LAD and left circumflex coronary arteries noted. 5. Right middle lobe lung nodule measures 3 mm. Non-contrast chest CT can be considered in 12 months if patient is high-risk. This recommendation follows the consensus statement: Guidelines for Management of Incidental Pulmonary Nodules Detected on CT Images: From the Fleischner Society 2017; Radiology 2017; 284:228-243. Electronically Signed   By: Kerby Moors M.D.   On: 08/22/2017 10:21        Scheduled Meds: . allopurinol  100 mg Per Tube Daily  . amantadine  100 mg Per Tube BID  . apixaban  2.5 mg Per Tube BID  . chlorhexidine gluconate (MEDLINE KIT)  15 mL Mouth Rinse BID  . collagenase  1 application Topical Daily  . famotidine  20 mg Per Tube BID  . ipratropium-albuterol  3 mL Nebulization Q6H  . mouth rinse  15 mL Mouth Rinse QID  . metoprolol tartrate  2.5 mg Intravenous Q6H  . omega-3 acid ethyl esters  1 g Per Tube Daily  . polyethylene glycol  17 g Oral Daily  . sertraline  50 mg Per Tube Daily  . tamsulosin  0.4 mg Oral Daily   Continuous Infusions: . 0.45 % NaCl with KCl 20 mEq / L 75 mL/hr at 08/23/17 1137  . feeding supplement (VITAL AF 1.2 CAL) 1,000 mL (08/23/17 1415)  . fluconazole (DIFLUCAN) IV    . meropenem (MERREM) IV Stopped  (08/23/17 1511)  . propofol (DIPRIVAN) infusion Stopped (08/23/17 0244)  . vancomycin Stopped (08/23/17 0640)     LOS: 5 days    Time spent: 25 minutes.     Lelon Frohlich, MD [facility] Pager [phone]  If 7PM-7AM, please contact night-coverage [email or web address] Password [facility] 08/23/2017, 4:50 PM

## 2017-08-24 DIAGNOSIS — J9601 Acute respiratory failure with hypoxia: Secondary | ICD-10-CM

## 2017-08-24 LAB — BASIC METABOLIC PANEL
Anion gap: 8 (ref 5–15)
BUN: 15 mg/dL (ref 6–20)
CALCIUM: 8.3 mg/dL — AB (ref 8.9–10.3)
CO2: 26 mmol/L (ref 22–32)
Chloride: 101 mmol/L (ref 101–111)
Glucose, Bld: 107 mg/dL — ABNORMAL HIGH (ref 65–99)
Potassium: 3.1 mmol/L — ABNORMAL LOW (ref 3.5–5.1)
SODIUM: 135 mmol/L (ref 135–145)

## 2017-08-24 LAB — CBC
HCT: 29.5 % — ABNORMAL LOW (ref 39.0–52.0)
HEMOGLOBIN: 8.8 g/dL — AB (ref 13.0–17.0)
MCH: 25.4 pg — AB (ref 26.0–34.0)
MCHC: 29.8 g/dL — AB (ref 30.0–36.0)
MCV: 85 fL (ref 78.0–100.0)
PLATELETS: 228 10*3/uL (ref 150–400)
RBC: 3.47 MIL/uL — ABNORMAL LOW (ref 4.22–5.81)
RDW: 15.9 % — ABNORMAL HIGH (ref 11.5–15.5)
WBC: 7.7 10*3/uL (ref 4.0–10.5)

## 2017-08-24 MED ORDER — POTASSIUM CHLORIDE 10 MEQ/100ML IV SOLN
10.0000 meq | INTRAVENOUS | Status: AC
  Administered 2017-08-24 (×3): 10 meq via INTRAVENOUS
  Filled 2017-08-24 (×3): qty 100

## 2017-08-24 MED ORDER — POTASSIUM CHLORIDE 10 MEQ/100ML IV SOLN
10.0000 meq | INTRAVENOUS | Status: DC
  Administered 2017-08-24 (×2): 10 meq via INTRAVENOUS
  Filled 2017-08-24 (×2): qty 100

## 2017-08-24 NOTE — Progress Notes (Signed)
Subjective: No new problems noted.  He has been able to remain on the trach collar since he was taken off the ventilator with good oxygenation.  He is having some spontaneous coughing up of secretions.  Speech evaluation noted and he was able to respond to questions with reduced intelligibility and single word responses which is frankly better than I expected.  Objective: Vital signs in last 24 hours: Temp:  [97.5 F (36.4 C)-99.3 F (37.4 C)] 98.1 F (36.7 C) (02/08 0500) Pulse Rate:  [76-90] 79 (02/08 0300) Resp:  [21-32] 27 (02/08 0300) BP: (102-147)/(54-72) 124/61 (02/08 0300) SpO2:  [91 %-99 %] 96 % (02/08 0300) FiO2 (%):  [28 %-35 %] 28 % (02/07 2045) Weight:  [81.5 kg (179 lb 10.8 oz)] 81.5 kg (179 lb 10.8 oz) (02/08 0600) Weight change: -1 kg (-3.3 oz) Last BM Date: 08/23/17  Intake/Output from previous day: 02/07 0701 - 02/08 0700 In: 5361.3 [I.V.:2550; NG/GT:1711.3; IV Piggyback:1100] Out: 3050 [Urine:3050]  PHYSICAL EXAM General appearance: Sleepy.  Not responsive this morning he does open his eyes Resp: clear to auscultation bilaterally Cardio: regular rate and rhythm, S1, S2 normal, no murmur, click, rub or gallop GI: soft, non-tender; bowel sounds normal; no masses,  no organomegaly Extremities: extremities normal, atraumatic, no cyanosis or edema Still large decubiti  Lab Results:  Results for orders placed or performed during the hospital encounter of 08/18/17 (from the past 48 hour(s))  Triglycerides     Status: None   Collection Time: 08/22/17  2:53 PM  Result Value Ref Range   Triglycerides 144 <150 mg/dL    Comment: Performed at [facility], [address], [place], [place] [address]  Vancomycin, trough     Status: Abnormal   Collection Time: 08/23/17  8:24 PM  Result Value Ref Range   Vancomycin Tr 10 (L) 15 - 20 ug/mL    Comment: Performed at [facility], [address], [place], [place] [address]  CBC     Status: Abnormal   Collection Time: 08/24/17   4:51 AM  Result Value Ref Range   WBC 7.7 4.0 - 10.5 K/uL   RBC 3.47 (L) 4.22 - 5.81 MIL/uL   Hemoglobin 8.8 (L) 13.0 - 17.0 g/dL   HCT 29.5 (L) 39.0 - 52.0 %   MCV 85.0 78.0 - 100.0 fL   MCH 25.4 (L) 26.0 - 34.0 pg   MCHC 29.8 (L) 30.0 - 36.0 g/dL   RDW 15.9 (H) 11.5 - 15.5 %   Platelets 228 150 - 400 K/uL    Comment: Performed at [facility], [address] [place], [place] [address]  Basic metabolic panel     Status: Abnormal   Collection Time: 08/24/17  4:51 AM  Result Value Ref Range   Sodium 135 135 - 145 mmol/L   Potassium 3.1 (L) 3.5 - 5.1 mmol/L   Chloride 101 101 - 111 mmol/L   CO2 26 22 - 32 mmol/L   Glucose, Bld 107 (H) 65 - 99 mg/dL   BUN 15 6 - 20 mg/dL   Creatinine, Ser <0.30 (L) 0.61 - 1.24 mg/dL   Calcium 8.3 (L) 8.9 - 10.3 mg/dL   GFR calc non Af Amer NOT CALCULATED >60 mL/min   GFR calc Af Amer NOT CALCULATED >60 mL/min    Comment: (NOTE) The eGFR has been calculated using the CKD EPI equation. This calculation has not been validated in all clinical situations. eGFR's persistently <60 mL/min signify possible Chronic Kidney Disease.  Anion gap 8 5 - 15    Comment: Performed at [facility], [address], [place], [place] [address]    ABGS Recent Labs    08/22/17 0455  PHART 7.480*  PO2ART 104.0  HCO3 26.7   CULTURES Recent Results (from the past 240 hour(s))  Culture, blood (Routine x 2)     Status: Abnormal   Collection Time: 08/18/17  9:42 PM  Result Value Ref Range Status   Specimen Description   Final    LEFT ANTECUBITAL Performed at [facility], [address], [place], [place] [address]    Special Requests   Final    BOTTLES DRAWN AEROBIC AND ANAEROBIC Blood Culture adequate volume Performed at [facility], [address], [place], [place] [address]    Culture  Setup Time   Final    GRAM POSITIVE COCCI Gram Stain Report Called to,Read Back By and Verified With: GAMMONS,S. AT 0203 ON 08/20/2017 BY EVA Performed at [facility]    Culture (A)  Final    STAPHYLOCOCCUS SPECIES (COAGULASE NEGATIVE) SUSCEPTIBILITIES PERFORMED ON PREVIOUS CULTURE WITHIN THE LAST 5 DAYS. Performed at [facility], [place] [address], [place], [place] [address]    Report Status 08/22/2017 FINAL  Final  Blood Culture ID Panel (Reflexed)     Status: Abnormal   Collection Time: 08/18/17  9:42 PM  Result Value Ref Range Status   Enterococcus species NOT DETECTED NOT DETECTED Final   Listeria monocytogenes NOT DETECTED NOT DETECTED Final   Staphylococcus species DETECTED (A) NOT DETECTED Final    Comment: Methicillin (oxacillin) resistant coagulase negative staphylococcus. Possible blood culture contaminant (unless isolated from more than one blood culture draw or clinical case suggests pathogenicity). No antibiotic treatment is indicated for blood  culture contaminants. L. Seay Pharm.D. 7:45 08/20/17 (wilsonm)    Staphylococcus aureus NOT DETECTED NOT DETECTED Final   Methicillin resistance DETECTED (A) NOT DETECTED Final    Comment: CRITICAL RESULT CALLED TO, READ BACK BY AND VERIFIED WITH: L. Seay Pharm.D. 7:45 08/20/17 (wilsonm)    Streptococcus species NOT DETECTED NOT DETECTED Final   Streptococcus agalactiae NOT DETECTED NOT DETECTED Final   Streptococcus pneumoniae NOT DETECTED NOT DETECTED Final   Streptococcus pyogenes NOT DETECTED NOT DETECTED Final   Acinetobacter baumannii NOT DETECTED NOT DETECTED Final   Enterobacteriaceae species NOT DETECTED NOT DETECTED Final   Enterobacter cloacae complex NOT DETECTED NOT DETECTED Final   Escherichia coli NOT DETECTED NOT DETECTED Final   Klebsiella oxytoca NOT DETECTED NOT DETECTED Final   Klebsiella pneumoniae NOT DETECTED NOT DETECTED Final   Proteus species NOT DETECTED NOT DETECTED Final   Serratia marcescens NOT DETECTED NOT DETECTED Final   Haemophilus influenzae NOT DETECTED NOT DETECTED Final   Neisseria meningitidis NOT DETECTED NOT DETECTED Final   Pseudomonas  aeruginosa NOT DETECTED NOT DETECTED Final   Candida albicans NOT DETECTED NOT DETECTED Final   Candida glabrata NOT DETECTED NOT DETECTED Final   Candida krusei NOT DETECTED NOT DETECTED Final   Candida parapsilosis NOT DETECTED NOT DETECTED Final   Candida tropicalis NOT DETECTED NOT DETECTED Final    Comment: Performed at [facility], [place] [address] [place], [place] [address]  Urine culture     Status: None   Collection Time: 08/18/17  9:46 PM  Result Value Ref Range Status   Specimen Description   Final    URINE, CLEAN CATCH Performed at [facility], [address], [place], [place] [address]  Special Requests   Final    NONE Performed at [facility], [address] [place], [place] [address]    Culture   Final    NO GROWTH Performed at [facility], [place] [address] [place], [place] [address]    Report Status 08/20/2017 FINAL  Final  Culture, blood (Routine x 2)     Status: Abnormal   Collection Time: 08/18/17  9:51 PM  Result Value Ref Range Status   Specimen Description   Final    BLOOD LEFT FOREARM Performed at [facility], [address], [place], [place] [address]    Special Requests   Final    BOTTLES DRAWN AEROBIC ONLY Blood Culture adequate volume Performed at [facility], [address], [place], [place] [address]    Culture  Setup Time   Final    GRAM POSITIVE COCCI Gram Stain Report Called to,Read Back By and Verified With: HOWARD,C. AT 2138 ON 08/19/2017 BY EVA AEROBIC BOTTLE ONLY Performed at [place] (COAGULASE NEGATIVE) (A)  Final   Report Status 08/22/2017 FINAL  Final   Organism ID, Bacteria STAPHYLOCOCCUS SPECIES (COAGULASE NEGATIVE)  Final      Susceptibility   Staphylococcus species (coagulase negative) - MIC*    CIPROFLOXACIN >=8 RESISTANT Resistant     ERYTHROMYCIN >=8 RESISTANT Resistant     GENTAMICIN >=16 RESISTANT Resistant     OXACILLIN >=4 RESISTANT Resistant     TETRACYCLINE 2  SENSITIVE Sensitive     VANCOMYCIN 1 SENSITIVE Sensitive     TRIMETH/SULFA 80 RESISTANT Resistant     CLINDAMYCIN >=8 RESISTANT Resistant     RIFAMPIN <=0.5 SENSITIVE Sensitive     Inducible Clindamycin NEGATIVE Sensitive     * STAPHYLOCOCCUS SPECIES (COAGULASE NEGATIVE)  Blood Culture ID Panel (Reflexed)     Status: Abnormal   Collection Time: 08/18/17  9:51 PM  Result Value Ref Range Status   Enterococcus species NOT DETECTED NOT DETECTED Final   Listeria monocytogenes NOT DETECTED NOT DETECTED Final   Staphylococcus species DETECTED (A) NOT DETECTED Final    Comment: Methicillin (oxacillin) resistant coagulase negative staphylococcus. Possible blood culture contaminant (unless isolated from more than one blood culture draw or clinical case suggests pathogenicity). No antibiotic treatment is indicated for blood  culture contaminants. CRITICAL RESULT CALLED TO, READ BACK BY AND VERIFIED WITH: TO SGANNONS(RN) BY TCLEVELAND 08/20/17 AT 5:44AM    Staphylococcus aureus NOT DETECTED NOT DETECTED Final   Methicillin resistance DETECTED (A) NOT DETECTED Final    Comment: CRITICAL RESULT CALLED TO, READ BACK BY AND VERIFIED WITH: TO SGANNONS(RN) BY TCLEVELAND 08/20/17 AT 5:44AM    Streptococcus species NOT DETECTED NOT DETECTED Final   Streptococcus agalactiae NOT DETECTED NOT DETECTED Final   Streptococcus pneumoniae NOT DETECTED NOT DETECTED Final   Streptococcus pyogenes NOT DETECTED NOT DETECTED Final   Acinetobacter baumannii NOT DETECTED NOT DETECTED Final   Enterobacteriaceae species NOT DETECTED NOT DETECTED Final   Enterobacter cloacae complex NOT DETECTED NOT DETECTED Final   Escherichia coli NOT DETECTED NOT DETECTED Final   Klebsiella oxytoca NOT DETECTED NOT DETECTED Final   Klebsiella pneumoniae NOT DETECTED NOT DETECTED Final   Proteus species NOT DETECTED NOT DETECTED Final   Serratia marcescens NOT DETECTED NOT DETECTED Final   Haemophilus influenzae NOT DETECTED NOT DETECTED  Final   Neisseria meningitidis NOT DETECTED NOT DETECTED Final   Pseudomonas aeruginosa NOT DETECTED NOT DETECTED Final   Candida albicans NOT DETECTED  NOT DETECTED Final   Candida glabrata NOT DETECTED NOT DETECTED Final   Candida krusei NOT DETECTED NOT DETECTED Final   Candida parapsilosis NOT DETECTED NOT DETECTED Final   Candida tropicalis NOT DETECTED NOT DETECTED Final    Comment: Performed at [facility], [place] [address] [place], [place] [address]  MRSA PCR Screening     Status: None   Collection Time: 08/19/17  1:09 AM  Result Value Ref Range Status   MRSA by PCR NEGATIVE NEGATIVE Final    Comment:        The GeneXpert MRSA Assay (FDA approved for NASAL specimens only), is one component of a comprehensive MRSA colonization surveillance program. It is not intended to diagnose MRSA infection nor to guide or monitor treatment for MRSA infections. Performed at [facility], [address], [place], [place] [address]   Respiratory Panel by PCR     Status: None   Collection Time: 08/19/17  1:55 AM  Result Value Ref Range Status   Adenovirus NOT DETECTED NOT DETECTED Final   Coronavirus 229E NOT DETECTED NOT DETECTED Final   Coronavirus HKU1 NOT DETECTED NOT DETECTED Final   Coronavirus NL63 NOT DETECTED NOT DETECTED Final   Coronavirus OC43 NOT DETECTED NOT DETECTED Final   Metapneumovirus NOT DETECTED NOT DETECTED Final   Rhinovirus / Enterovirus NOT DETECTED NOT DETECTED Final   Influenza A NOT DETECTED NOT DETECTED Final   Influenza B NOT DETECTED NOT DETECTED Final   Parainfluenza Virus 1 NOT DETECTED NOT DETECTED Final   Parainfluenza Virus 2 NOT DETECTED NOT DETECTED Final   Parainfluenza Virus 3 NOT DETECTED NOT DETECTED Final   Parainfluenza Virus 4 NOT DETECTED NOT DETECTED Final   Respiratory Syncytial Virus NOT DETECTED NOT DETECTED Final   Bordetella pertussis NOT DETECTED NOT DETECTED Final   Chlamydophila pneumoniae NOT DETECTED NOT DETECTED Final    Mycoplasma pneumoniae NOT DETECTED NOT DETECTED Final    Comment: Performed at [facility], [place] [address] [place], [place] [address]  Culture, respiratory (NON-Expectorated)     Status: None   Collection Time: 08/19/17  2:40 AM  Result Value Ref Range Status   Specimen Description   Final    TRACHEAL ASPIRATE Performed at [facility], [address], [place], [place] [address]    Special Requests   Final    NONE Performed at [facility], [address], [place], [place] [address]    Gram Stain   Final    ABUNDANT WBC PRESENT, PREDOMINANTLY PMN RARE SQUAMOUS EPITHELIAL CELLS PRESENT ABUNDANT GRAM POSITIVE COCCI IN PAIRS IN CLUSTERS FEW GRAM POSITIVE RODS FEW GRAM NEGATIVE RODS    Culture   Final    Consistent with normal respiratory flora. Performed at [facility], [place] [address], [place], [place] [address]    Report Status 08/21/2017 FINAL  Final  Culture, blood (Routine X 2) w Reflex to ID Panel     Status: None (Preliminary result)   Collection Time: 08/20/17  7:51 AM  Result Value Ref Range Status   Specimen Description BLOOD LEFT HAND  Final   Special Requests   Final    BOTTLES DRAWN AEROBIC AND ANAEROBIC Blood Culture adequate volume   Culture   Final    NO GROWTH 4 DAYS Performed at [facility], [address], [place], [place] [address]    Report Status PENDING  Incomplete  Culture, blood (Routine X 2) w Reflex to ID Panel  Status: None (Preliminary result)   Collection Time: 08/20/17  7:52 AM  Result Value Ref Range Status   Specimen Description BLOOD LEFT WRIST  Final   Special Requests   Final    BOTTLES DRAWN AEROBIC AND ANAEROBIC Blood Culture adequate volume   Culture   Final    NO GROWTH 4 DAYS Performed at [facility], [address], [place], [place] [address]    Report Status PENDING  Incomplete   Studies/Results: No results found.  Medications:  Prior to Admission:  Medications Prior to Admission  Medication Sig Dispense  Refill Last Dose  . allopurinol (ZYLOPRIM) 100 MG tablet Place 100 mg into feeding tube 3 (three) times a week. Every Tuesday, Thursday, Sunday   08/16/2017 at Unknown time  . amantadine (SYMMETREL) 100 MG capsule Place 100 mg into feeding tube 2 (two) times daily.   08/18/2017 at Unknown time  . apixaban (ELIQUIS) 2.5 MG TABS tablet Place 2.5 mg into feeding tube 2 (two) times daily.   08/18/2017 at 1700  . collagenase (SANTYL) ointment Apply 1 application topically daily. Right buttocks area   08/18/2017 at Unknown time  . famotidine (PEPCID) 20 MG tablet Place 20 mg into feeding tube 2 (two) times daily.   08/18/2017 at Unknown time  . HYDROcodone-acetaminophen (NORCO/VICODIN) 5-325 MG tablet Place 1 tablet into feeding tube every 6 (six) hours as needed for moderate pain.   unknown  . Melatonin 3 MG TABS Place 3 mg into feeding tube at bedtime.   08/18/2017 at Unknown time  . Metoprolol Tartrate 37.5 MG TABS Place 1 tablet into feeding tube daily.   08/18/2017 at 800a  . modafinil (PROVIGIL) 100 MG tablet Place 100 mg into feeding tube daily.   08/18/2017 at Unknown time  . Omega-3 Fatty Acids (FISH OIL) 1000 MG CAPS Place 1 capsule into feeding tube daily.   08/18/2017 at Unknown time  . polyethylene glycol powder (GLYCOLAX/MIRALAX) powder Take 17 g by mouth daily.   08/18/2017 at Unknown time  . sertraline (ZOLOFT) 50 MG tablet Place 50 mg into feeding tube daily.   08/18/2017 at Unknown time  . tamsulosin (FLOMAX) 0.4 MG CAPS capsule 0.4 mg daily. Per G-Tube   08/18/2017 at Unknown time   Scheduled: . allopurinol  100 mg Per Tube Daily  . amantadine  100 mg Per Tube BID  . apixaban  2.5 mg Per Tube BID  . chlorhexidine gluconate (MEDLINE KIT)  15 mL Mouth Rinse BID  . collagenase  1 application Topical Daily  . famotidine  20 mg Per Tube BID  . ipratropium-albuterol  3 mL Nebulization Q6H  . mouth rinse  15 mL Mouth Rinse QID  . metoprolol tartrate  2.5 mg Intravenous Q6H  . omega-3 acid ethyl esters  1 g  Per Tube Daily  . polyethylene glycol  17 g Oral Daily  . sertraline  50 mg Per Tube Daily  . tamsulosin  0.4 mg Oral Daily   Continuous: . 0.45 % NaCl with KCl 20 mEq / L 75 mL/hr at 08/24/17 0412  . feeding supplement (VITAL AF 1.2 CAL) 1,000 mL (08/24/17 0245)  . fluconazole (DIFLUCAN) IV 100 mg (08/23/17 1740)  . meropenem (MERREM) IV Stopped (08/24/17 1950)  . propofol (DIPRIVAN) infusion Stopped (08/23/17 0244)  . vancomycin     DTO:[id number] **OR** acetaminophen, midazolam, ondansetron **OR** ondansetron (ZOFRAN) IV  Assesment: He was admitted with sepsis from [facility] presumably secondary to aspiration pneumonia but also potentially due to his decubitus  ulcers.  He had acute on chronic hypoxic respiratory failure and initially was able to manage with a trach collar but later was placed on mechanical ventilation because of increased respiratory effort and secretions and difficulty maintaining his oxygenation.  He was taken back off the ventilator and that was successful and he is maintained on trach collar since.  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  He has not been able to vocalize but he did some yesterday.  I agree with Elie Goody that the patient's wife is aware of how sick he is but remains hopeful that he will have some improvement. Principal Problem:   Sepsis ([place]) Active Problems:   Hyperlipidemia   Essential hypertension   CAD, NATIVE VESSEL   Stroke (cerebrum) (HCC)   SAH (subarachnoid hemorrhage) (HCC)   Pneumonia   Acute hypoxemic respiratory failure (HCC)   Acute on chronic respiratory failure with hypoxia (HCC)    Sepsis due to undetermined organism ([place])   Aspiration pneumonia of both lower lobes due to gastric secretions (HCC)   Tracheostomy status (HCC)   Deep vein thrombosis (DVT) of right upper extremity (HCC)   Transaminasemia   Lobar pneumonia ([place])   Sacral decubitus ulcer, stage III (HCC)   Staphylococcal sepsis ([place])   Healthcare-associated pneumonia   Malnutrition of moderate degree   Palliative care encounter    Plan: Continue current treatments.  He would be okay to switch to a cuff less trach but of course will have high risk of recurrent aspiration with that    LOS: 6 days   Harbert Fitterer L 08/24/2017, 7:36 AM

## 2017-08-24 NOTE — Progress Notes (Signed)
Talked with Richard Mcpherson at bedside this morning.  She's encouraged that he was able to say a few words with the passy-muir valve yesterday.  My hope is that perhaps Dennie Bible and Gene will talk in the next days to weeks and he can express his wishes to her.  Per Dennie Bible:   "I know how sick he is don't think I don't, but as long as he's improving there is hope and we will keep going".  She is relieved the sepsis is gone and he his stabilizing.   She is hopeful for d/c to SNF when he is optimized.  I expressed to Dennie Bible that she and Gene will know the right time to let go.  She thanked me and said she had my card if she needed me.  PMT will check on Richard Mcpherson and Gene intermittently but I strongly believe her goals of care will not change this admission unless he has a decline.  Norvel Richards, PA-C Palliative Medicine Pager: [phone]  15 min.

## 2017-08-24 NOTE — Progress Notes (Signed)
PROGRESS NOTE    Richard Mcpherson  IDH:[id number] DOB: Sep 09, 1940 DOA: 08/18/2017 PCP: [name], MD     Brief Narrative:  77 year old man admitted from SNF on 2/2 with complaints of fever and chills.  He has a very complex medical history.  I have reviewed his recent hospitalization from [place] in detail.  Was initially admitted to [facility] rocking him on 06/19/2017 after an unwitnessed fall and was noted to have acute mental status changes with right hemiparesis, from there was transferred to [facility] he was found to have an acute CVA for which she received IV TPA.  He then had a prolonged hospitalization with numerous complications.  Subsequently noted to have a subdural hematoma on MRI.  He had cardiac arrest with asystole which was felt to be due to to his cervical spine contusion, taken to surgery on 12/19 for an occipital to C6 fusion with C1, C2 and C3 laminectomies.  Was able to be extubated but required reintubation on 12/19.  Hospitalization was further complicated by right axillary vein DVT resulting from a right upper extremity PICC line.  He was started on a heparin drip.  He had a repeat MRI of the brain on 12/29 that showed resolving subdural hematoma.  Subsequently developed Enterobacter ventilator associated pneumonia treated with Zosyn, unfortunately he continues to have fevers and difficulty weaning from the ventilator.  Had a tracheostomy and gastrostomy tube placed during his hospitalization.  [facility] where he was weaned off the ventilator and discharged to skilled nursing facility on 08/17/17.  He was subsequently sent back to our hospital on 2/2 with fevers.  Here he was started on vancomycin and Zosyn initially, 2 out of 2 blood cultures have grown out coag negative staph with methicillin resistance, he continues on vancomycin.  Repeat blood cultures on 2/4 are negative.  He was able to be weaned from the ventilator on 2/6 to  trach collar.  Speech therapy is now working on placing Passy-Muir valve to see if he can speak some words.  Palliative care is involved.   Assessment & Plan:   Principal Problem:   Sepsis ([name]) Active Problems:   Hyperlipidemia   Essential hypertension   CAD, NATIVE VESSEL   Stroke (cerebrum) (HCC)   SAH (subarachnoid hemorrhage) (HCC)   Pneumonia   Acute hypoxemic respiratory failure (HCC)   Acute on chronic respiratory failure with hypoxia (HCC)   Sepsis due to undetermined organism ([place])   Aspiration pneumonia of both lower lobes due to gastric secretions (HCC)   Tracheostomy status ([name])   Deep vein thrombosis (DVT) of right upper extremity (HCC)   Transaminasemia   Lobar pneumonia ([name])   Sacral decubitus ulcer, stage III (HCC)   Staphylococcal sepsis ([name])   Healthcare-associated pneumonia   Malnutrition of moderate degree   Palliative care encounter   Acute respiratory failure with hypoxemia (HCC)   Sepsis -Secondary to pneumonia and coag negative staph bacteremia. -Continue meropenem and vancomycin as there is concern for aspiration. -Reviewed echo report from 08/20/17: EF 65-70%, no wall motion abnormalities, no vegetations. -Discussed case via phone with Dr. Megan Salon, infectious diseases, who recommends continuing antibiotics for total of 7-10 days.  Discussed with pharmacy as well.  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 -Patient was started on mechanical ventilation on 2/3 due to respiratory distress and worsening hypoxemia, was able to be extubated on 2/6. -Dr. Luan [place]ing following. -CT scan  of the chest without PE but significant left lower lobe infiltrate and consolidation. -Continue meropenem as well as vancomycin. -Has copious respiratory secretions today.  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 -Per wife is more alert, she states he has been able to follow some commands by blinking once or twice for yes or no questions.  Stage III sacral decubitus ulcer -Wound care following.  Severe protein caloric malnutrition -Continue enteral feedings and supplements as per dietitian recommendations.  Oral thrush -Continue IV  fluconazole for 5 days,,  Today is day 2/5.   DVT prophylaxis: Eliquis Code Status: Full code Family Communication: Discussed in detail with wife at bedside. Disposition Plan: Keep in ICU today.  Continue palliative care discussions given very poor short and long-term prognosis.  Doubt patient's wife will be interested in palliative care at least this admission.  Consultants:   Pulmonology  Palliative care  Procedures:   Echo as above  Antimicrobials:  Anti-infectives (From admission, onward)   Start     Dose/Rate Route Frequency Ordered Stop   08/24/17 0900  vancomycin (VANCOCIN) 1,250 mg in sodium chloride 0.9 % 250 mL IVPB     1,250 mg 166.7 mL/hr over 90 Minutes Intravenous Every 12 hours 08/23/17 2139     08/23/17 1800  fluconazole (DIFLUCAN) IVPB 100 mg  Status:  Discontinued     100 mg 50 mL/hr over 60 Minutes Intravenous Every 24 hours 08/23/17 1649 08/23/17 1702   08/23/17 1800  fluconazole (DIFLUCAN) IVPB 100 mg     100 mg 50 mL/hr over 60 Minutes Intravenous Every 24 hours 08/23/17 1702     08/20/17 2030  vancomycin (VANCOCIN) IVPB 1000 mg/200 mL premix     1,000 mg 200 mL/hr over 60 Minutes Intravenous Every 12 hours 08/20/17 0834 08/23/17 2301   08/20/17 0830  vancomycin (VANCOCIN) 1,500 mg in sodium chloride 0.9 % 500 mL IVPB     1,500 mg 250 mL/hr over 120 Minutes Intravenous  Once 08/20/17 0818 08/20/17 1125   08/19/17 1800  vancomycin (VANCOCIN) IVPB 750 mg/150 ml premix  Status:  Discontinued     750 mg 150 mL/hr over 60 Minutes  Intravenous Every 12 hours 08/19/17 1026 08/19/17 1425   08/19/17 0900  meropenem (MERREM) 2 g in sodium chloride 0.9 % 100 mL IVPB  Status:  Discontinued     2 g 200 mL/hr over 30 Minutes Intravenous Every 8 hours 08/19/17 0837 08/19/17 0839   08/19/17 0900  meropenem (MERREM) 1 g in sodium chloride 0.9 % 100 mL IVPB     1 g 200 mL/hr over 30 Minutes Intravenous Every 8 hours 08/19/17 0839     08/19/17 0600  piperacillin-tazobactam (ZOSYN) IVPB 3.375 g  Status:  Discontinued     3.375 g 12.5 mL/hr over 240 Minutes Intravenous Every 8 hours 08/18/17 2316 08/19/17 0825   08/19/17 0600  vancomycin (VANCOCIN) IVPB 1000 mg/200 mL premix     1,000 mg 200 mL/hr over 60 Minutes Intravenous  Once 08/18/17 2318 08/19/17 0610   08/18/17 2200  piperacillin-tazobactam (ZOSYN) IVPB 3.375 g     3.375 g 100 mL/hr over 30 Minutes Intravenous  Once 08/18/17 2159 08/18/17 2252   08/18/17 2200  vancomycin (VANCOCIN) IVPB 1000 mg/200 mL premix     1,000 mg 200 mL/hr over 60 Minutes Intravenous  Once 08/18/17  2159 08/18/17 2307       Subjective: Lying in bed, eyes open, coughing sporadically throughout my time in the room.  Objective: Vitals:   08/24/17 0300 08/24/17 0500 08/24/17 0600 08/24/17 0948  BP: 124/61     Pulse: 79     Resp: (!) 27     Temp:  98.1 F (36.7 C)    TempSrc:  Axillary    SpO2: 96%   94%  Weight:   81.5 kg (179 lb 10.8 oz)   Height:        Intake/Output Summary (Last 24 hours) at 08/24/2017 1141 Last data filed at 08/24/2017 0600 Gross per 24 hour  Intake 5361.25 ml  Output 2300 ml  Net 3061.25 ml   Filed Weights   08/22/17 0500 08/23/17 0500 08/24/17 0600  Weight: 81 kg (178 lb 9.2 oz) 82.5 kg (181 lb 14.1 oz) 81.5 kg (179 lb 10.8 oz)    Examination:   General exam: Awake, unable to assess orientation given nonverbal state Respiratory system: Coarse bilateral breath sounds. Cardiovascular system:RRR. No murmurs, rubs, gallops. Gastrointestinal system: Abdomen is  nondistended, soft and nontender. No organomegaly or masses felt. Normal bowel sounds heard. Central nervous system: Unable to fully assess given nonverbal state, does not appear to have purposeful movements of extremities. Extremities: No C/C/E, +pedal pulses Psychiatry: Unable to assess given nonverbal state     Data Reviewed: I have personally reviewed following labs and imaging studies  CBC: Recent Labs  Lab 08/18/17 2145 08/19/17 0450 08/20/17 0405 08/21/17 1258 08/22/17 0531 08/24/17 0451  WBC 19.4* 21.9* 20.9* 12.5* 9.5 7.7  NEUTROABS 16.9  --   --   --   --   --   HGB 11.8* 10.7* 9.8* 8.4* 8.2* 8.8*  HCT 39.7 36.7* 33.5* 28.8* 27.5* 29.5*  MCV 87.6 88.2 89.1 87.8 86.8 85.0  PLT 348 298 263 224 201 761   Basic Metabolic Panel: Recent Labs  Lab 08/19/17 0450 08/20/17 0405 08/21/17 1258 08/22/17 0531 08/24/17 0451  NA 146* 144 141 139 135  K 3.6 3.7 3.4* 3.2* 3.1*  CL 112* 108 110 107 101  CO2 _0 GLUCOSE 129* 93 112* 108* 107*  BUN 33* 34* 31* 27* 15  CREATININE 0.39* 0.41* 0.30* <0.30* <0.30*  CALCIUM 8.9 8.7* 8.3* 8.4* 8.3*  MG  --  1.9  --   --   --   PHOS  --  3.2  --   --   --    GFR: CrCl cannot be calculated (This lab value cannot be used to calculate CrCl because it is not a number: <0.30). Liver Function Tests: Recent Labs  Lab 08/18/17 2145 08/20/17 0405 08/21/17 1258  AST 55* 24 52*  ALT 97* 50 56  ALKPHOS 414* 260* 279*  BILITOT 0.6 1.2 0.9  PROT 7.3 6.1* 5.6*  ALBUMIN 2.3* 1.9* 1.6*   No results for input(s): LIPASE, AMYLASE in the last 168 hours. No results for input(s): AMMONIA in the last 168 hours. Coagulation Profile: Recent Labs  Lab 08/18/17 2145  INR 1.37   Cardiac Enzymes: No results for input(s): CKTOTAL, CKMB, CKMBINDEX, TROPONINI in the last 168 hours. BNP (last 3 results) No results for input(s): PROBNP in the last 8760 hours. HbA1C: No results for input(s): HGBA1C in the last 72 hours. CBG: Recent  Labs  Lab 08/19/17 0736 08/21/17 1608  GLUCAP 135* 120*   Lipid Profile: Recent Labs    08/22/17 1453  TRIG  144   Thyroid Function Tests: No results for input(s): TSH, T4TOTAL, FREET4, T3FREE, THYROIDAB in the last 72 hours. Anemia Panel: No results for input(s): VITAMINB12, FOLATE, FERRITIN, TIBC, IRON, RETICCTPCT in the last 72 hours. Urine analysis:    Component Value Date/Time   COLORURINE AMBER (A) 08/18/2017 2146   APPEARANCEUR HAZY (A) 08/18/2017 2146   LABSPEC 1.025 08/18/2017 2146   PHURINE 6.0 08/18/2017 2146   GLUCOSEU NEGATIVE 08/18/2017 2146   HGBUR MODERATE (A) 08/18/2017 2146   BILIRUBINUR NEGATIVE 08/18/2017 2146   [place] NEGATIVE 08/18/2017 2146   PROTEINUR 30 (A) 08/18/2017 2146   NITRITE NEGATIVE 08/18/2017 2146   LEUKOCYTESUR NEGATIVE 08/18/2017 2146   Sepsis Labs: _0 (procalcitonin:4,lacticidven:4)  ) Recent Results (from the past 240 hour(s))  Culture, blood (Routine x 2)     Status: Abnormal   Collection Time: 08/18/17  9:42 PM  Result Value Ref Range Status   Specimen Description   Final    LEFT ANTECUBITAL Performed at [facility], [address] [place], [place] [address]    Special Requests   Final    BOTTLES DRAWN AEROBIC AND ANAEROBIC Blood Culture adequate volume Performed at [facility], [address] [place], [place] [address]    Culture  Setup Time   Final    GRAM POSITIVE COCCI Gram Stain Report Called to,Read Back By and Verified With: GAMMONS,S. AT 0203 ON 08/20/2017 BY EVA Performed at [facility]    Culture (A)  Final    STAPHYLOCOCCUS SPECIES (COAGULASE NEGATIVE) SUSCEPTIBILITIES PERFORMED ON PREVIOUS CULTURE WITHIN THE LAST 5 DAYS. Performed at [facility], [place] [address], [place], [place] [address]    Report Status 08/22/2017 FINAL  Final  Blood Culture ID Panel (Reflexed)     Status: Abnormal   Collection Time: 08/18/17  9:42 PM  Result Value Ref Range Status   Enterococcus species NOT  DETECTED NOT DETECTED Final   Listeria monocytogenes NOT DETECTED NOT DETECTED Final   Staphylococcus species DETECTED (A) NOT DETECTED Final    Comment: Methicillin (oxacillin) resistant coagulase negative staphylococcus. Possible blood culture contaminant (unless isolated from more than one blood culture draw or clinical case suggests pathogenicity). No antibiotic treatment is indicated for blood  culture contaminants. L. Seay Pharm.D. 7:45 08/20/17 (wil[place]m)    Staphylococcus aureus NOT DETECTED NOT DETECTED Final   Methicillin resistance DETECTED (A) NOT DETECTED Final    Comment: CRITICAL RESULT CALLED TO, READ BACK BY AND VERIFIED WITH: L. Seay Pharm.D. 7:45 08/20/17 (wilsonm)    Streptococcus species NOT DETECTED NOT DETECTED Final   Streptococcus agalactiae NOT DETECTED NOT DETECTED Final   Streptococcus pneumoniae NOT DETECTED NOT DETECTED Final   Streptococcus pyogenes NOT DETECTED NOT DETECTED Final   Acinetobacter baumannii NOT DETECTED NOT DETECTED Final   Enterobacteriaceae species NOT DETECTED NOT DETECTED Final   Enterobacter cloacae complex NOT DETECTED NOT DETECTED Final   Escherichia coli NOT DETECTED NOT DETECTED Final   Klebsiella oxytoca NOT DETECTED NOT DETECTED Final   Klebsiella pneumoniae NOT DETECTED NOT DETECTED Final   Proteus species NOT DETECTED NOT DETECTED Final   Serratia marcescens NOT DETECTED NOT DETECTED Final   Haemophilus influenzae NOT DETECTED NOT DETECTED Final   Neisseria meningitidis NOT DETECTED NOT DETECTED Final   Pseudomonas aeruginosa NOT DETECTED NOT DETECTED Final   Candida albicans NOT DETECTED NOT DETECTED Final   Candida glabrata NOT DETECTED NOT DETECTED Final   Candida krusei NOT DETECTED NOT DETECTED Final   Candida parapsilosis NOT DETECTED  NOT DETECTED Final   Candida tropicalis NOT DETECTED NOT DETECTED Final    Comment: Performed at [facility], [place] [address] [place], [place] [address]  Urine culture     Status: None     Collection Time: 08/18/17  9:46 PM  Result Value Ref Range Status   Specimen Description   Final    URINE, CLEAN CATCH Performed at [facility], [address] [place], [place] [address]    Special Requests   Final    NONE Performed at [facility], [address], [place], [place] [address]    Culture   Final    NO GROWTH Performed at [facility], [place] [address], [place], [place] [address]    Report Status 08/20/2017 FINAL  Final  Culture, blood (Routine x 2)     Status: Abnormal   Collection Time: 08/18/17  9:51 PM  Result Value Ref Range Status   Specimen Description   Final    BLOOD LEFT FOREARM Performed at [facility], [address], [place], [place] [address]    Special Requests   Final    BOTTLES DRAWN AEROBIC ONLY Blood Culture adequate volume Performed at [facility], [address] [place], [place] [address]    Culture  Setup Time   Final    GRAM POSITIVE COCCI Gram Stain Report Called to,Read Back By and Verified With: HOWARD,C. AT 2138 ON 08/19/2017 BY EVA AEROBIC BOTTLE ONLY Performed at [facility] (COAGULASE NEGATIVE) (A)  Final   Report Status 08/22/2017 FINAL  Final   Organism ID, Bacteria STAPHYLOCOCCUS SPECIES (COAGULASE NEGATIVE)  Final      Susceptibility   Staphylococcus species (coagulase negative) - MIC*    CIPROFLOXACIN >=8 RESISTANT Resistant     ERYTHROMYCIN >=8 RESISTANT Resistant     GENTAMICIN >=16 RESISTANT Resistant     OXACILLIN >=4 RESISTANT Resistant     TETRACYCLINE 2 SENSITIVE Sensitive     VANCOMYCIN 1 SENSITIVE Sensitive     TRIMETH/SULFA 80 RESISTANT Resistant     CLINDAMYCIN >=8 RESISTANT Resistant     RIFAMPIN <=0.5 SENSITIVE Sensitive     Inducible Clindamycin NEGATIVE Sensitive     * STAPHYLOCOCCUS SPECIES (COAGULASE NEGATIVE)  Blood Culture ID Panel (Reflexed)     Status: Abnormal   Collection Time: 08/18/17  9:51 PM  Result Value Ref Range Status   Enterococcus species  NOT DETECTED NOT DETECTED Final   Listeria monocytogenes NOT DETECTED NOT DETECTED Final   Staphylococcus species DETECTED (A) NOT DETECTED Final    Comment: Methicillin (oxacillin) resistant coagulase negative staphylococcus. Possible blood culture contaminant (unless isolated from more than one blood culture draw or clinical case suggests pathogenicity). No antibiotic treatment is indicated for blood  culture contaminants. CRITICAL RESULT CALLED TO, READ BACK BY AND VERIFIED WITH: TO SGANNONS(RN) BY TCLEVELAND 08/20/17 AT 5:44AM    Staphylococcus aureus NOT DETECTED NOT DETECTED Final   Methicillin resistance DETECTED (A) NOT DETECTED Final    Comment: CRITICAL RESULT CALLED TO, READ BACK BY AND VERIFIED WITH: TO SGANNONS(RN) BY TCLEVELAND 08/20/17 AT 5:44AM    Streptococcus species NOT DETECTED NOT DETECTED Final   Streptococcus agalactiae NOT DETECTED NOT DETECTED Final   Streptococcus pneumoniae NOT DETECTED NOT DETECTED Final   Streptococcus pyogenes NOT DETECTED NOT DETECTED Final   Acinetobacter baumannii NOT DETECTED NOT DETECTED Final   Enterobacteriaceae species NOT DETECTED NOT DETECTED Final   Enterobacter cloacae complex NOT DETECTED NOT  DETECTED Final   Escherichia coli NOT DETECTED NOT DETECTED Final   Klebsiella oxytoca NOT DETECTED NOT DETECTED Final   Klebsiella pneumoniae NOT DETECTED NOT DETECTED Final   Proteus species NOT DETECTED NOT DETECTED Final   Serratia marcescens NOT DETECTED NOT DETECTED Final   Haemophilus influenzae NOT DETECTED NOT DETECTED Final   Neisseria meningitidis NOT DETECTED NOT DETECTED Final   Pseudomonas aeruginosa NOT DETECTED NOT DETECTED Final   Candida albicans NOT DETECTED NOT DETECTED Final   Candida glabrata NOT DETECTED NOT DETECTED Final   Candida krusei NOT DETECTED NOT DETECTED Final   Candida parapsilosis NOT DETECTED NOT DETECTED Final   Candida tropicalis NOT DETECTED NOT DETECTED Final    Comment: Performed at [facility], [place] [address], [place], [place] [address]  MRSA PCR Screening     Status: None   Collection Time: 08/19/17  1:09 AM  Result Value Ref Range Status   MRSA by PCR NEGATIVE NEGATIVE Final    Comment:        The GeneXpert MRSA Assay (FDA approved for NASAL specimens only), is one component of a comprehensive MRSA colonization surveillance program. It is not intended to diagnose MRSA infection nor to guide or monitor treatment for MRSA infections. Performed at [facility], [address], [place], [place] [address]   Respiratory Panel by PCR     Status: None   Collection Time: 08/19/17  1:55 AM  Result Value Ref Range Status   Adenovirus NOT DETECTED NOT DETECTED Final   Coronavirus 229E NOT DETECTED NOT DETECTED Final   Coronavirus HKU1 NOT DETECTED NOT DETECTED Final   Coronavirus NL63 NOT DETECTED NOT DETECTED Final   Coronavirus OC43 NOT DETECTED NOT DETECTED Final   Metapneumovirus NOT DETECTED NOT DETECTED Final   Rhinovirus / Enterovirus NOT DETECTED NOT DETECTED Final   Influenza A NOT DETECTED NOT DETECTED Final   Influenza B NOT DETECTED NOT DETECTED Final   Parainfluenza Virus 1 NOT DETECTED NOT DETECTED Final   Parainfluenza Virus 2 NOT DETECTED NOT DETECTED Final   Parainfluenza Virus 3 NOT DETECTED NOT DETECTED Final   Parainfluenza Virus 4 NOT DETECTED NOT DETECTED Final   Respiratory Syncytial Virus NOT DETECTED NOT DETECTED Final   Bordetella pertussis NOT DETECTED NOT DETECTED Final   Chlamydophila pneumoniae NOT DETECTED NOT DETECTED Final   Mycoplasma pneumoniae NOT DETECTED NOT DETECTED Final    Comment: Performed at [facility], [place] [address], [place], [place] [address]  Culture, respiratory (NON-Expectorated)     Status: None   Collection Time: 08/19/17  2:40 AM  Result Value Ref Range Status   Specimen Description   Final    TRACHEAL ASPIRATE Performed at [facility], [address] [place], [place] [address]    Special Requests    Final    NONE Performed at [facility], [address] [place], [place] [address]    Gram Stain   Final    ABUNDANT WBC PRESENT, PREDOMINANTLY PMN RARE SQUAMOUS EPITHELIAL CELLS PRESENT ABUNDANT GRAM POSITIVE COCCI IN PAIRS IN CLUSTERS FEW GRAM POSITIVE RODS FEW GRAM NEGATIVE RODS    Culture   Final    Consistent with normal respiratory flora. Performed at [facility], [place] [address] [place], [place] [address]    Report Status 08/21/2017 FINAL  Final  Culture, blood (Routine X 2) w Reflex to ID Panel     Status: None (Preliminary result)   Collection Time: 08/20/17  7:51 AM  Result Value Ref Range Status   Specimen Description BLOOD LEFT HAND  Final   Special Requests   Final    BOTTLES DRAWN AEROBIC AND ANAEROBIC Blood Culture adequate volume   Culture   Final    NO GROWTH 4 DAYS Performed at [facility], [address], [place], [place] [address]    Report Status PENDING  Incomplete  Culture, blood (Routine X 2) w Reflex to ID Panel     Status: None (Preliminary result)   Collection Time: 08/20/17  7:52 AM  Result Value Ref Range Status   Specimen Description BLOOD LEFT WRIST  Final   Special Requests   Final    BOTTLES DRAWN AEROBIC AND ANAEROBIC Blood Culture adequate volume   Culture   Final    NO GROWTH 4 DAYS Performed at [facility], [address] [place],  [address]    Report Status PENDING  Incomplete         Radiology Studies: No results found.      Scheduled Meds: . allopurinol  100 mg Per Tube Daily  . amantadine  100 mg Per Tube BID  . apixaban  2.5 mg Per Tube BID  . chlorhexidine gluconate (MEDLINE KIT)  15 mL Mouth Rinse BID  . collagenase  1 application Topical Daily  . famotidine  20 mg Per Tube BID  . ipratropium-albuterol  3 mL Nebulization Q6H  . mouth rinse  15 mL Mouth Rinse QID  . metoprolol tartrate  2.5 mg Intravenous Q6H  . omega-3 acid ethyl esters  1 g Per Tube Daily  . polyethylene glycol  17 g Oral Daily    . sertraline  50 mg Per Tube Daily  . tamsulosin  0.4 mg Oral Daily   Continuous Infusions: . 0.45 % NaCl with KCl 20 mEq / L 75 mL/hr at 08/24/17 0412  . feeding supplement (VITAL AF 1.2 CAL) 1,000 mL (08/24/17 0245)  . fluconazole (DIFLUCAN) IV 100 mg (08/23/17 1740)  . meropenem (MERREM) IV Stopped (08/24/17 2876)  . vancomycin       LOS: 6 days    Time spent: 25 minutes.     Lelon Frohlich, MD [facility] Pager [phone]  If 7PM-7AM, please contact night-coverage [email or web address] Password [facility] 08/24/2017, 11:41 AM

## 2017-08-24 NOTE — Progress Notes (Signed)
  Speech Language Pathology Treatment:    Patient Details Name: Richard Mcpherson MRN: [id number] DOB: Aug 08, 1940 Today's Date: 08/24/2017 Time: 3662-9476 SLP Time Calculation (min) (ACUTE ONLY): 43 min  Assessment / Plan / Recommendation Clinical Impression  Pt seen for PMSV therapy at bedside with spouse present on trach collar and 28% oxygen. Cuff slowly deflated with good tolerance, RN suctioned via trach site with mild blood tinged secretions. PMV placed with stable HR, RR, and O2. Pt with reduced breath support for speech resulting in poor vocal intensity. Pt was intermittently audible at the word level and was able to phonate this date, intellgibility poor secondary to above mentioned deficits. PMV utilized for 30 minutes and left on. Communicated with RT regarding recommendations for PMSV use during all wake hours for improved secretion management and improved sensation. Educated pts spouse regarding recommendations, PMSV benefits and precuations. PMSV precautions posted at bedside. Extensive oral care completed by SLP with significant improvements. Recommend continued oral care QID.  ST to continue to follow    HPI HPI: Patient was admitted to [facility] on Friday from [facility]). Has a hx of CVA and is s/p Trach and PEG placement following what initially started as a fall at home      SLP Plan  Continue with current plan of care       Recommendations  Diet recommendations: NPO      Patient may use Passy-Muir Speech Valve: During all waking hours (remove during sleep) PMSV Supervision: Intermittent MD: Please consider changing trach tube to : Cuffless         Oral Care Recommendations: Oral care QID Follow up Recommendations: Skilled Nursing facility SLP Visit Diagnosis: Aphonia (R49.1) Plan: Continue with current plan of care       GO               Richard Sow MA, CCC-SLP Acute Care Speech Language Pathologist    Richard Mcpherson Richard Mcpherson 08/24/2017, 11:59 AM

## 2017-08-24 NOTE — Clinical Social Work Note (Signed)
LCSW following with daily chart review and discussion in progression. Pt continues with plan of care. Palliative Care has consulted. At this time, anticipating dc back to [facility] when pt is medically stable. Will follow up next week.

## 2017-08-25 ENCOUNTER — Other Ambulatory Visit: Payer: Self-pay

## 2017-08-25 LAB — CULTURE, BLOOD (ROUTINE X 2)
Culture: NO GROWTH
Culture: NO GROWTH
Special Requests: ADEQUATE
Special Requests: ADEQUATE

## 2017-08-25 LAB — TRIGLYCERIDES: TRIGLYCERIDES: 104 mg/dL (ref <150)

## 2017-08-25 MED ORDER — IPRATROPIUM BROMIDE 0.02 % IN SOLN
RESPIRATORY_TRACT | Status: AC
  Administered 2017-08-25: 0.5 mg
  Filled 2017-08-25: qty 2.5

## 2017-08-25 MED ORDER — ALBUTEROL SULFATE (2.5 MG/3ML) 0.083% IN NEBU
INHALATION_SOLUTION | RESPIRATORY_TRACT | Status: AC
  Administered 2017-08-25: 2.5 mg
  Filled 2017-08-25: qty 3

## 2017-08-25 NOTE — Plan of Care (Signed)
  SLP PMSV Goals Patient will utilize PMSV for minutes without signs Description Patient will utilize PMSV for minutes without signs of distress or decline in vital signs with 08/25/2017 1208 - Progressing by Dorene Ar, CCC-SLP Flowsheets Taken 08/25/2017 1208  Patient will utilize PMSV for ____ minutes without signs of distress or decline in vital signs with ____ 45 min;min assist   Thank you,  Havery Moros, CCC-SLP [phone]

## 2017-08-25 NOTE — Progress Notes (Signed)
Subjective: He is overall about the same.  He used a speech valve again yesterday and still had trouble communicating but was able to communicate at least some.  He still has some secretions but they are much better.  He was able to oxygenate using nasal cannula yesterday but is back on trach collar today  Objective: Vital signs in last 24 hours: Temp:  [97.7 F (36.5 C)-98.7 F (37.1 C)] 97.7 F (36.5 C) (02/09 0400) Pulse Rate:  [78-89] 86 (02/09 0200) Resp:  [20-33] 31 (02/09 0200) BP: (99-142)/(62-88) 118/62 (02/09 0200) SpO2:  [93 %-97 %] 94 % (02/09 0844) FiO2 (%):  [28 %] 28 % (02/09 0136) Weight:  [85.4 kg (188 lb 4.4 oz)] 85.4 kg (188 lb 4.4 oz) (02/09 0400) Weight change: 3.9 kg (8 lb 9.6 oz) Last BM Date: 08/24/16  Intake/Output from previous day: 02/08 0701 - 02/09 0700 In: 3618.8 [I.V.:2418.8; IV Piggyback:1200] Out: 5000 [Urine:5000]  PHYSICAL EXAM General appearance: alert and no distress Resp: rhonchi bilaterally Cardio: regular rate and rhythm, S1, S2 normal, no murmur, click, rub or gallop GI: Feeding tube in place Extremities: extremities normal, atraumatic, no cyanosis or edema He is the most alert I have seen him today  Lab Results:  Results for orders placed or performed during the hospital encounter of 08/18/17 (from the past 48 hour(s))  Vancomycin, trough     Status: Abnormal   Collection Time: 08/23/17  8:24 PM  Result Value Ref Range   Vancomycin Tr 10 (Mcpherson) 15 - 20 ug/mL    Comment: Performed at [facility], [address], [place], [place] [address]  CBC     Status: Abnormal   Collection Time: 08/24/17  4:51 AM  Result Value Ref Range   WBC 7.7 4.0 - 10.5 K/uL   RBC 3.47 (Mcpherson) 4.22 - 5.81 MIL/uL   Hemoglobin 8.8 (Mcpherson) 13.0 - 17.0 g/dL   HCT 29.5 (Mcpherson) 39.0 - 52.0 %   MCV 85.0 78.0 - 100.0 fL   MCH 25.4 (Mcpherson) 26.0 - 34.0 pg   MCHC 29.8 (Mcpherson) 30.0 - 36.0 g/dL   RDW 15.9 (H) 11.5 - 15.5 %   Platelets 228 150 - 400 K/uL    Comment: Performed at [facility], [address], [place], [place] [address]  Basic metabolic panel     Status: Abnormal   Collection Time: 08/24/17  4:51 AM  Result Value Ref Range   Sodium 135 135 - 145 mmol/Mcpherson   Potassium 3.1 (Mcpherson) 3.5 - 5.1 mmol/Mcpherson   Chloride 101 101 - 111 mmol/Mcpherson   CO2 26 22 - 32 mmol/Mcpherson   Glucose, Bld 107 (H) 65 - 99 mg/dL   BUN 15 6 - 20 mg/dL   Creatinine, Ser <0.30 (Mcpherson) 0.61 - 1.24 mg/dL   Calcium 8.3 (Mcpherson) 8.9 - 10.3 mg/dL   GFR calc non Af Amer NOT CALCULATED >60 mL/min   GFR calc Af Amer NOT CALCULATED >60 mL/min    Comment: (NOTE) The eGFR has been calculated using the CKD EPI equation. This calculation has not been validated in all clinical situations. eGFR's persistently <60 mL/min signify possible Chronic Kidney Disease.    Anion gap 8 5 - 15    Comment: Performed at [facility], [address] [place], [place] [address]    ABGS No results for input(s): PHART, PO2ART, TCO2, HCO3 in the last 72 hours.  Invalid input(s): PCO2 CULTURES Recent Results (from the past 240 hour(s))  Culture, blood (Routine x 2)  Status: Abnormal   Collection Time: 08/18/17  9:42 PM  Result Value Ref Range Status   Specimen Description   Final    LEFT ANTECUBITAL Performed at [facility], [address], [place], [place] [address]    Special Requests   Final    BOTTLES DRAWN AEROBIC AND ANAEROBIC Blood Culture adequate volume Performed at [facility], [address], [place], [place] [address]    Culture  Setup Time   Final    GRAM POSITIVE COCCI Gram Stain Report Called to,Read Back By and Verified With: GAMMONS,S. AT 0203 ON 08/20/2017 BY EVA Performed at [facility]    Culture (A)  Final    STAPHYLOCOCCUS SPECIES (COAGULASE NEGATIVE) SUSCEPTIBILITIES PERFORMED ON PREVIOUS CULTURE WITHIN THE LAST 5 DAYS. Performed at [facility], [place] [address] [place], [place] [address]    Report Status 08/22/2017 FINAL  Final  Blood Culture ID Panel (Reflexed)     Status: Abnormal    Collection Time: 08/18/17  9:42 PM  Result Value Ref Range Status   Enterococcus species NOT DETECTED NOT DETECTED Final   Listeria monocytogenes NOT DETECTED NOT DETECTED Final   Staphylococcus species DETECTED (A) NOT DETECTED Final    Comment: Methicillin (oxacillin) resistant coagulase negative staphylococcus. Possible blood culture contaminant (unless isolated from more than one blood culture draw or clinical case suggests pathogenicity). No antibiotic treatment is indicated for blood  culture contaminants. Mcpherson. Seay Pharm.D. 7:45 08/20/17 (wilsonm)    Staphylococcus aureus NOT DETECTED NOT DETECTED Final   Methicillin resistance DETECTED (A) NOT DETECTED Final    Comment: CRITICAL RESULT CALLED TO, READ BACK BY AND VERIFIED WITH: Mcpherson. Seay Pharm.D. 7:45 08/20/17 (wilsonm)    Streptococcus species NOT DETECTED NOT DETECTED Final   Streptococcus agalactiae NOT DETECTED NOT DETECTED Final   Streptococcus pneumoniae NOT DETECTED NOT DETECTED Final   Streptococcus pyogenes NOT DETECTED NOT DETECTED Final   Acinetobacter baumannii NOT DETECTED NOT DETECTED Final   Enterobacteriaceae species NOT DETECTED NOT DETECTED Final   Enterobacter cloacae complex NOT DETECTED NOT DETECTED Final   Escherichia coli NOT DETECTED NOT DETECTED Final   Klebsiella oxytoca NOT DETECTED NOT DETECTED Final   Klebsiella pneumoniae NOT DETECTED NOT DETECTED Final   Proteus species NOT DETECTED NOT DETECTED Final   Serratia marcescens NOT DETECTED NOT DETECTED Final   Haemophilus influenzae NOT DETECTED NOT DETECTED Final   Neisseria meningitidis NOT DETECTED NOT DETECTED Final   Pseudomonas aeruginosa NOT DETECTED NOT DETECTED Final   Candida albicans NOT DETECTED NOT DETECTED Final   Candida glabrata NOT DETECTED NOT DETECTED Final   Candida krusei NOT DETECTED NOT DETECTED Final   Candida parapsilosis NOT DETECTED NOT DETECTED Final   Candida tropicalis NOT DETECTED NOT DETECTED Final    Comment: Performed at  [facility], [place] [address], [place], [place] [address]  Urine culture     Status: None   Collection Time: 08/18/17  9:46 PM  Result Value Ref Range Status   Specimen Description   Final    URINE, CLEAN CATCH Performed at [facility], [address], [place], [place] [address]    Special Requests   Final    NONE Performed at [facility], [address] [place], [place] [address]    Culture   Final    NO GROWTH Performed at [facility], [place] [address], [place], [place] [address]    Report Status 08/20/2017 FINAL  Final  Culture, blood (Routine x  2)     Status: Abnormal   Collection Time: 08/18/17  9:51 PM  Result Value Ref Range Status   Specimen Description   Final    BLOOD LEFT FOREARM Performed at [facility], [address], [place], [place] [address]    Special Requests   Final    BOTTLES DRAWN AEROBIC ONLY Blood Culture adequate volume Performed at [facility], [address], [place], [place] [address]    Culture  Setup Time   Final    GRAM POSITIVE COCCI Gram Stain Report Called to,Read Back By and Verified With: HOWARD,C. AT 2138 ON 08/19/2017 BY EVA AEROBIC BOTTLE ONLY Performed at [place] (COAGULASE NEGATIVE) (A)  Final   Report Status 08/22/2017 FINAL  Final   Organism ID, Bacteria STAPHYLOCOCCUS SPECIES (COAGULASE NEGATIVE)  Final      Susceptibility   Staphylococcus species (coagulase negative) - MIC*    CIPROFLOXACIN >=8 RESISTANT Resistant     ERYTHROMYCIN >=8 RESISTANT Resistant     GENTAMICIN >=16 RESISTANT Resistant     OXACILLIN >=4 RESISTANT Resistant     TETRACYCLINE 2 SENSITIVE Sensitive     VANCOMYCIN 1 SENSITIVE Sensitive     TRIMETH/SULFA 80 RESISTANT Resistant     CLINDAMYCIN >=8 RESISTANT Resistant     RIFAMPIN <=0.5 SENSITIVE Sensitive     Inducible Clindamycin NEGATIVE Sensitive     * STAPHYLOCOCCUS SPECIES (COAGULASE NEGATIVE)  Blood Culture ID Panel (Reflexed)     Status:  Abnormal   Collection Time: 08/18/17  9:51 PM  Result Value Ref Range Status   Enterococcus species NOT DETECTED NOT DETECTED Final   Listeria monocytogenes NOT DETECTED NOT DETECTED Final   Staphylococcus species DETECTED (A) NOT DETECTED Final    Comment: Methicillin (oxacillin) resistant coagulase negative staphylococcus. Possible blood culture contaminant (unless isolated from more than one blood culture draw or clinical case suggests pathogenicity). No antibiotic treatment is indicated for blood  culture contaminants. CRITICAL RESULT CALLED TO, READ BACK BY AND VERIFIED WITH: TO SGANNONS(RN) BY TCLEVELAND 08/20/17 AT 5:44AM    Staphylococcus aureus NOT DETECTED NOT DETECTED Final   Methicillin resistance DETECTED (A) NOT DETECTED Final    Comment: CRITICAL RESULT CALLED TO, READ BACK BY AND VERIFIED WITH: TO SGANNONS(RN) BY TCLEVELAND 08/20/17 AT 5:44AM    Streptococcus species NOT DETECTED NOT DETECTED Final   Streptococcus agalactiae NOT DETECTED NOT DETECTED Final   Streptococcus pneumoniae NOT DETECTED NOT DETECTED Final   Streptococcus pyogenes NOT DETECTED NOT DETECTED Final   Acinetobacter baumannii NOT DETECTED NOT DETECTED Final   Enterobacteriaceae species NOT DETECTED NOT DETECTED Final   Enterobacter cloacae complex NOT DETECTED NOT DETECTED Final   Escherichia coli NOT DETECTED NOT DETECTED Final   Klebsiella oxytoca NOT DETECTED NOT DETECTED Final   Klebsiella pneumoniae NOT DETECTED NOT DETECTED Final   Proteus species NOT DETECTED NOT DETECTED Final   Serratia marcescens NOT DETECTED NOT DETECTED Final   Haemophilus influenzae NOT DETECTED NOT DETECTED Final   Neisseria meningitidis NOT DETECTED NOT DETECTED Final   Pseudomonas aeruginosa NOT DETECTED NOT DETECTED Final   Candida albicans NOT DETECTED NOT DETECTED Final   Candida glabrata NOT DETECTED NOT DETECTED Final   Candida krusei NOT DETECTED NOT DETECTED Final   Candida parapsilosis NOT DETECTED NOT DETECTED  Final   Candida tropicalis NOT DETECTED NOT DETECTED Final    Comment: Performed at [facility], [place]. [address], [place], [place] [address]  MRSA PCR  Screening     Status: None   Collection Time: 08/19/17  1:09 AM  Result Value Ref Range Status   MRSA by PCR NEGATIVE NEGATIVE Final    Comment:        The GeneXpert MRSA Assay (FDA approved for NASAL specimens only), is one component of a comprehensive MRSA colonization surveillance program. It is not intended to diagnose MRSA infection nor to guide or monitor treatment for MRSA infections. Performed at [facility], [address], [place], [place] [address]   Respiratory Panel by PCR     Status: None   Collection Time: 08/19/17  1:55 AM  Result Value Ref Range Status   Adenovirus NOT DETECTED NOT DETECTED Final   Coronavirus 229E NOT DETECTED NOT DETECTED Final   Coronavirus HKU1 NOT DETECTED NOT DETECTED Final   Coronavirus NL63 NOT DETECTED NOT DETECTED Final   Coronavirus OC43 NOT DETECTED NOT DETECTED Final   Metapneumovirus NOT DETECTED NOT DETECTED Final   Rhinovirus / Enterovirus NOT DETECTED NOT DETECTED Final   Influenza A NOT DETECTED NOT DETECTED Final   Influenza B NOT DETECTED NOT DETECTED Final   Parainfluenza Virus 1 NOT DETECTED NOT DETECTED Final   Parainfluenza Virus 2 NOT DETECTED NOT DETECTED Final   Parainfluenza Virus 3 NOT DETECTED NOT DETECTED Final   Parainfluenza Virus 4 NOT DETECTED NOT DETECTED Final   Respiratory Syncytial Virus NOT DETECTED NOT DETECTED Final   Bordetella pertussis NOT DETECTED NOT DETECTED Final   Chlamydophila pneumoniae NOT DETECTED NOT DETECTED Final   Mycoplasma pneumoniae NOT DETECTED NOT DETECTED Final    Comment: Performed at [facility], [place] [address] [place], [place] [address]  Culture, respiratory (NON-Expectorated)     Status: None   Collection Time: 08/19/17  2:40 AM  Result Value Ref Range Status   Specimen Description   Final    TRACHEAL  ASPIRATE Performed at [facility], [address], [place], [place] [address]    Special Requests   Final    NONE Performed at [facility], [address] [place], [place] [address]    Gram Stain   Final    ABUNDANT WBC PRESENT, PREDOMINANTLY PMN RARE SQUAMOUS EPITHELIAL CELLS PRESENT ABUNDANT GRAM POSITIVE COCCI IN PAIRS IN CLUSTERS FEW GRAM POSITIVE RODS FEW GRAM NEGATIVE RODS    Culture   Final    Consistent with normal respiratory flora. Performed at [facility], [place] [address], [place], [place] [address]    Report Status 08/21/2017 FINAL  Final  Culture, blood (Routine X 2) w Reflex to ID Panel     Status: None   Collection Time: 08/20/17  7:51 AM  Result Value Ref Range Status   Specimen Description BLOOD LEFT HAND  Final   Special Requests   Final    BOTTLES DRAWN AEROBIC AND ANAEROBIC Blood Culture adequate volume   Culture   Final    NO GROWTH 5 DAYS Performed at [facility], [address], [place],  [address]    Report Status 08/25/2017 FINAL  Final  Culture, blood (Routine X 2) w Reflex to ID Panel     Status: None   Collection Time: 08/20/17  7:52 AM  Result Value Ref Range Status   Specimen Description BLOOD LEFT WRIST  Final   Special Requests   Final    BOTTLES DRAWN AEROBIC AND ANAEROBIC Blood Culture adequate volume   Culture   Final    NO GROWTH 5 DAYS Performed at [facility], [address] [place],  [address]    Report Status 08/25/2017 FINAL  Final   Studies/Results: No results found.  Medications:  Prior to Admission:  Medications Prior to Admission  Medication Sig Dispense Refill Last Dose  . allopurinol (ZYLOPRIM) 100 MG tablet Place 100 mg into feeding tube 3 (three) times a week. Every Tuesday, Thursday, Sunday   08/16/2017 at Unknown time  . amantadine (SYMMETREL) 100 MG capsule Place 100 mg into feeding tube 2 (two) times daily.   08/18/2017 at Unknown time  . apixaban (ELIQUIS) 2.5 MG TABS tablet Place 2.5 mg into  feeding tube 2 (two) times daily.   08/18/2017 at 1700  . collagenase (SANTYL) ointment Apply 1 application topically daily. Right buttocks area   08/18/2017 at Unknown time  . famotidine (PEPCID) 20 MG tablet Place 20 mg into feeding tube 2 (two) times daily.   08/18/2017 at Unknown time  . HYDROcodone-acetaminophen (NORCO/VICODIN) 5-325 MG tablet Place 1 tablet into feeding tube every 6 (six) hours as needed for moderate pain.   unknown  . Melatonin 3 MG TABS Place 3 mg into feeding tube at bedtime.   08/18/2017 at Unknown time  . Metoprolol Tartrate 37.5 MG TABS Place 1 tablet into feeding tube daily.   08/18/2017 at 800a  . modafinil (PROVIGIL) 100 MG tablet Place 100 mg into feeding tube daily.   08/18/2017 at Unknown time  . Omega-3 Fatty Acids (FISH OIL) 1000 MG CAPS Place 1 capsule into feeding tube daily.   08/18/2017 at Unknown time  . polyethylene glycol powder (GLYCOLAX/MIRALAX) powder Take 17 g by mouth daily.   08/18/2017 at Unknown time  . sertraline (ZOLOFT) 50 MG tablet Place 50 mg into feeding tube daily.   08/18/2017 at Unknown time  . tamsulosin (FLOMAX) 0.4 MG CAPS capsule 0.4 mg daily. Per G-Tube   08/18/2017 at Unknown time   Scheduled: . allopurinol  100 mg Per Tube Daily  . amantadine  100 mg Per Tube BID  . apixaban  2.5 mg Per Tube BID  . chlorhexidine gluconate (MEDLINE KIT)  15 mL Mouth Rinse BID  . collagenase  1 application Topical Daily  . famotidine  20 mg Per Tube BID  . ipratropium-albuterol  3 mL Nebulization Q6H  . mouth rinse  15 mL Mouth Rinse QID  . metoprolol tartrate  2.5 mg Intravenous Q6H  . omega-3 acid ethyl esters  1 g Per Tube Daily  . polyethylene glycol  17 g Oral Daily  . sertraline  50 mg Per Tube Daily  . tamsulosin  0.4 mg Oral Daily   Continuous: . 0.45 % NaCl with KCl 20 mEq / Mcpherson 75 mL/hr at 08/24/17 2139  . feeding supplement (VITAL AF 1.2 CAL) 1,000 mL (08/24/17 2048)  . fluconazole (DIFLUCAN) IV Stopped (08/24/17 2140)  . meropenem (MERREM) IV  Stopped (08/25/17 0759)  . vancomycin Stopped (08/24/17 2310)   RAQ:[id number] **OR** acetaminophen, midazolam, ondansetron **OR** ondansetron (ZOFRAN) IV  Assesment: He was admitted with sepsis from aspiration pneumonia.  He is improving.  He had positive blood cultures for Staphylococcus.  He is no longer septic.  His pneumonia seems to be improving.  He has acute on chronic hypoxic respiratory failure and briefly required ventilator support because of air hunger and difficulty with secretions and difficulty maintaining his oxygenation with the trach collar.  This has all improved.  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  He still has altered mental status but is more alert today  He has a feeding tube in place for nutritional support  He had serious injury to his neck when he fell while having the stroke and that required surgery.  He has been on and off the ventilator for the last 6 weeks or so but he is doing well now Principal Problem:   Sepsis ([place]) Active Problems:   Hyperlipidemia   Essential hypertension   CAD, NATIVE VESSEL   Stroke (cerebrum) (HCC)   SAH (subarachnoid hemorrhage) (HCC)   Pneumonia   Acute hypoxemic respiratory failure (HCC)   Acute on chronic respiratory failure with hypoxia (HCC)   Sepsis due to undetermined organism ([place])   Aspiration pneumonia of both lower lobes due to gastric secretions (HCC)   Tracheostomy status ([place])   Deep vein thrombosis (DVT) of right upper extremity (HCC)   Transaminasemia   Lobar pneumonia ([place])   Sacral decubitus ulcer, 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  Still using the speech valve periodically with speech therapy's help    LOS: 7  days   Richard Mcpherson 08/25/2017, 9:01 AM

## 2017-08-25 NOTE — Progress Notes (Signed)
PROGRESS NOTE    Richard Mcpherson  HBZ:[id number] DOB: 1941/05/16 DOA: 08/18/2017 PCP: [name], MD     Brief Narrative:  77 year old man admitted from SNF on 2/2 with complaints of fever and chills.  He has a very complex medical history.  I have reviewed his recent hospitalization from [place] in detail.  Was initially admitted to [facility] rocking him on 06/19/2017 after an unwitnessed fall and was noted to have acute mental status changes with right hemiparesis, from there was transferred to [name] he was found to have an acute CVA for which she received IV TPA.  He then had a prolonged hospitalization with numerous complications.  Subsequently noted to have a subdural hematoma on MRI.  He had cardiac arrest with asystole which was felt to be due to to his cervical spine contusion, taken to surgery on 12/19 for an occipital to C6 fusion with C1, C2 and C3 laminectomies.  Was able to be extubated but required reintubation on 12/19.  Hospitalization was further complicated by right axillary vein DVT resulting from a right upper extremity PICC line.  He was started on a heparin drip.  He had a repeat MRI of the brain on 12/29 that showed resolving subdural hematoma.  Subsequently developed Enterobacter ventilator associated pneumonia treated with Zosyn, unfortunately he continues to have fevers and difficulty weaning from the ventilator.  Had a tracheostomy and gastrostomy tube placed during his hospitalization.  [facility] where he was weaned off the ventilator and discharged to skilled nursing facility on 08/17/17.  He was subsequently sent back to our hospital on 2/2 with fevers.  Here he was started on vancomycin and Zosyn initially, 2 out of 2 blood cultures have grown out coag negative staph with methicillin resistance, he continues on vancomycin.  Repeat blood cultures on 2/4 are negative.  He was able to be weaned from the ventilator on 2/6 to  trach collar.  Speech therapy is now working on placing Passy-Muir valve to see if he can speak some words.  Palliative care is involved.   Assessment & Plan:   Principal Problem:   Sepsis ([place]) Active Problems:   Hyperlipidemia   Essential hypertension   CAD, NATIVE VESSEL   Stroke (cerebrum) (HCC)   SAH (subarachnoid hemorrhage) (HCC)   Pneumonia   Acute hypoxemic respiratory failure (HCC)   Acute on chronic respiratory failure with hypoxia (HCC)   Sepsis due to undetermined organism ([place])   Aspiration pneumonia of both lower lobes due to gastric secretions (HCC)   Tracheostomy status ([place])   Deep vein thrombosis (DVT) of right upper extremity (HCC)   Transaminasemia   Lobar pneumonia ([place])   Sacral decubitus ulcer, stage III (HCC)   Staphylococcal sepsis ([place])   Healthcare-associated pneumonia   Malnutrition of moderate degree   Palliative care encounter   Acute respiratory failure with hypoxemia (HCC)   Sepsis -Secondary to pneumonia and coag negative staph bacteremia. -Continue meropenem and vancomycin as there is concern for aspiration. -Reviewed echo report from 08/20/17: EF 65-70%, no wall motion abnormalities, no vegetations. -Discussed case via phone with Dr. Megan Salon, infectious diseases, who recommends continuing antibiotics for total of 7-10 days.  Discussed with pharmacy as well. Last day is Sunday.  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 -Patient was started on mechanical ventilation on 2/3 due to respiratory distress and worsening hypoxemia, was able to be extubated on 2/6. -Dr.  Hawkins following. -CT scan of the chest without PE but significant left lower lobe infiltrate and consolidation. -Continue meropenem as well as vancomycin. -Has copious respiratory secretions today.  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 -Per wife is more alert, she states he has been able to follow some commands by blinking once or twice for yes or no questions. Also some words with Passy-Muir valve.  Stage III sacral decubitus ulcer -Wound care following.  Severe protein caloric malnutrition -Continue enteral feedings and supplements as per dietitian recommendations.  Oral thrush -Continue IV  fluconazole for 5 days,,  Today is day 3/5.   DVT prophylaxis: Eliquis Code Status: Full code Family Communication: Discussed in detail with wife at bedside. Disposition Plan: Keep in ICU today.  Continue palliative care discussions given very poor short and long-term prognosis.  Doubt patient's wife will be interested in palliative care at least this admission. Plan for return to SNF early next week.  Consultants:   Pulmonology  Palliative care  Procedures:   Echo as above  Antimicrobials:  Anti-infectives (From admission, onward)   Start     Dose/Rate Route Frequency Ordered Stop   08/24/17 0900  vancomycin (VANCOCIN) 1,250 mg in sodium chloride 0.9 % 250 mL IVPB     1,250 mg 166.7 mL/hr over 90 Minutes Intravenous Every 12 hours 08/23/17 2139     08/23/17 1800  fluconazole (DIFLUCAN) IVPB 100 mg  Status:  Discontinued     100 mg 50 mL/hr over 60 Minutes Intravenous Every 24 hours 08/23/17 1649 08/23/17 1702   08/23/17 1800  fluconazole (DIFLUCAN) IVPB 100 mg     100 mg 50 mL/hr over 60 Minutes Intravenous Every 24 hours 08/23/17 1702     08/20/17 2030  vancomycin (VANCOCIN) IVPB 1000 mg/200 mL premix     1,000 mg 200 mL/hr over 60 Minutes Intravenous Every 12 hours 08/20/17 0834 08/23/17 2301   08/20/17 0830  vancomycin (VANCOCIN) 1,500 mg in sodium chloride 0.9 % 500 mL IVPB     1,500 mg 250 mL/hr over 120 Minutes Intravenous  Once 08/20/17 0818 08/20/17 1125   08/19/17 1800  vancomycin  (VANCOCIN) IVPB 750 mg/150 ml premix  Status:  Discontinued     750 mg 150 mL/hr over 60 Minutes Intravenous Every 12 hours 08/19/17 1026 08/19/17 1425   08/19/17 0900  meropenem (MERREM) 2 g in sodium chloride 0.9 % 100 mL IVPB  Status:  Discontinued     2 g 200 mL/hr over 30 Minutes Intravenous Every 8 hours 08/19/17 0837 08/19/17 0839   08/19/17 0900  meropenem (MERREM) 1 g in sodium chloride 0.9 % 100 mL IVPB     1 g 200 mL/hr over 30 Minutes Intravenous Every 8 hours 08/19/17 0839     08/19/17 0600  piperacillin-tazobactam (ZOSYN) IVPB 3.375 g  Status:  Discontinued     3.375 g 12.5 mL/hr over 240 Minutes Intravenous Every 8 hours 08/18/17 2316 08/19/17 0825   08/19/17 0600  vancomycin (VANCOCIN) IVPB 1000 mg/200 mL premix     1,000 mg 200 mL/hr over 60 Minutes Intravenous  Once 08/18/17 2318 08/19/17 0610   08/18/17 2200  piperacillin-tazobactam (ZOSYN) IVPB 3.375 g     3.375 g 100 mL/hr over 30 Minutes Intravenous  Once 08/18/17 2159 08/18/17 2252   08/18/17 2200  vancomycin (VANCOCIN) IVPB 1000  mg/200 mL premix     1,000 mg 200 mL/hr over 60 Minutes Intravenous  Once 08/18/17 2159 08/18/17 2307       Subjective: Eyes open, says "hey". Not able to answer any other questions.  Objective: Vitals:   08/25/17 1300 08/25/17 1400 08/25/17 1500 08/25/17 1503  BP: 117/60 118/66 112/62   Pulse: 85 79 84   Resp: (!) 28 (!) 30 (!) 28   Temp:      TempSrc:      SpO2: 95% 93% 92% 92%  Weight:      Height:        Intake/Output Summary (Last 24 hours) at 08/25/2017 1551 Last data filed at 08/25/2017 1300 Gross per 24 hour  Intake 5395 ml  Output 5000 ml  Net 395 ml   Filed Weights   08/23/17 0500 08/24/17 0600 08/25/17 0400  Weight: 82.5 kg (181 lb 14.1 oz) 81.5 kg (179 lb 10.8 oz) 85.4 kg (188 lb 4.4 oz)    Examination:   General exam: awake, unable to assess orientation as he remains largely non-verbal. Respiratory system: Coarse bilateral breath sounds. Cardiovascular  system:RRR. No murmurs, rubs, gallops. Gastrointestinal system: Abdomen is nondistended, soft and nontender. No organomegaly or masses felt. Normal bowel sounds heard. Central nervous system: Do not appreciate any purposeful movements of upper or lower extremities. Extremities: No C/C/E, +pedal pulses Psychiatry: Unable to assess given non-verbal state      Data Reviewed: I have personally reviewed following labs and imaging studies  CBC: Recent Labs  Lab 08/18/17 2145 08/19/17 0450 08/20/17 0405 08/21/17 1258 08/22/17 0531 08/24/17 0451  WBC 19.4* 21.9* 20.9* 12.5* 9.5 7.7  NEUTROABS 16.9  --   --   --   --   --   HGB 11.8* 10.7* 9.8* 8.4* 8.2* 8.8*  HCT 39.7 36.7* 33.5* 28.8* 27.5* 29.5*  MCV 87.6 88.2 89.1 87.8 86.8 85.0  PLT 348 298 263 224 201 569   Basic Metabolic Panel: Recent Labs  Lab 08/19/17 0450 08/20/17 0405 08/21/17 1258 08/22/17 0531 08/24/17 0451  NA 146* 144 141 139 135  K 3.6 3.7 3.4* 3.2* 3.1*  CL 112* 108 110 107 101  CO2 '24 23 23 24 26  '$ GLUCOSE 129* 93 112* 108* 107*  BUN 33* 34* 31* 27* 15  CREATININE 0.39* 0.41* 0.30* <0.30* <0.30*  CALCIUM 8.9 8.7* 8.3* 8.4* 8.3*  MG  --  1.9  --   --   --   PHOS  --  3.2  --   --   --    GFR: CrCl cannot be calculated (This lab value cannot be used to calculate CrCl because it is not a number: <0.30). Liver Function Tests: Recent Labs  Lab 08/18/17 2145 08/20/17 0405 08/21/17 1258  AST 55* 24 52*  ALT 97* 50 56  ALKPHOS 414* 260* 279*  BILITOT 0.6 1.2 0.9  PROT 7.3 6.1* 5.6*  ALBUMIN 2.3* 1.9* 1.6*   No results for input(s): LIPASE, AMYLASE in the last 168 hours. No results for input(s): AMMONIA in the last 168 hours. Coagulation Profile: Recent Labs  Lab 08/18/17 2145  INR 1.37   Cardiac Enzymes: No results for input(s): CKTOTAL, CKMB, CKMBINDEX, TROPONINI in the last 168 hours. BNP (last 3 results) No results for input(s): PROBNP in the last 8760 hours. HbA1C: No results for  input(s): HGBA1C in the last 72 hours. CBG: Recent Labs  Lab 08/19/17 0736 08/21/17 1608  GLUCAP 135* 120*   Lipid Profile: Recent  Labs    08/25/17 1413  TRIG 104   Thyroid Function Tests: No results for input(s): TSH, T4TOTAL, FREET4, T3FREE, THYROIDAB in the last 72 hours. Anemia Panel: No results for input(s): VITAMINB12, FOLATE, FERRITIN, TIBC, IRON, RETICCTPCT in the last 72 hours. Urine analysis:    Component Value Date/Time   COLORURINE AMBER (A) 08/18/2017 2146   APPEARANCEUR HAZY (A) 08/18/2017 2146   LABSPEC 1.025 08/18/2017 2146   PHURINE 6.0 08/18/2017 2146   GLUCOSEU NEGATIVE 08/18/2017 2146   HGBUR MODERATE (A) 08/18/2017 2146   BILIRUBINUR NEGATIVE 08/18/2017 2146   [place] NEGATIVE 08/18/2017 2146   PROTEINUR 30 (A) 08/18/2017 2146   NITRITE NEGATIVE 08/18/2017 2146   LEUKOCYTESUR NEGATIVE 08/18/2017 2146   Sepsis Labs: '@LABRCNTIP'$ (procalcitonin:4,lacticidven:4)  ) Recent Results (from the past 240 hour(s))  Culture, blood (Routine x 2)     Status: Abnormal   Collection Time: 08/18/17  9:42 PM  Result Value Ref Range Status   Specimen Description   Final    LEFT ANTECUBITAL Performed at [facility], [address], [place], [place] [address]    Special Requests   Final    BOTTLES DRAWN AEROBIC AND ANAEROBIC Blood Culture adequate volume Performed at [facility], [address], [place], [place] [address]    Culture  Setup Time   Final    GRAM POSITIVE COCCI Gram Stain Report Called to,Read Back By and Verified With: GAMMONS,S. AT 0203 ON 08/20/2017 BY EVA Performed at [facility]    Culture (A)  Final    STAPHYLOCOCCUS SPECIES (COAGULASE NEGATIVE) SUSCEPTIBILITIES PERFORMED ON PREVIOUS CULTURE WITHIN THE LAST 5 DAYS. Performed at [facility], [place] [address], [place], [place] [address]    Report Status 08/22/2017 FINAL  Final  Blood Culture ID Panel (Reflexed)     Status: Abnormal   Collection Time: 08/18/17  9:42 PM  Result  Value Ref Range Status   Enterococcus species NOT DETECTED NOT DETECTED Final   Listeria monocytogenes NOT DETECTED NOT DETECTED Final   Staphylococcus species DETECTED (A) NOT DETECTED Final    Comment: Methicillin (oxacillin) resistant coagulase negative staphylococcus. Possible blood culture contaminant (unless isolated from more than one blood culture draw or clinical case suggests pathogenicity). No antibiotic treatment is indicated for blood  culture contaminants. L. Seay Pharm.D. 7:45 08/20/17 (wilsonm)    Staphylococcus aureus NOT DETECTED NOT DETECTED Final   Methicillin resistance DETECTED (A) NOT DETECTED Final    Comment: CRITICAL RESULT CALLED TO, READ BACK BY AND VERIFIED WITH: L. Seay Pharm.D. 7:45 08/20/17 (wilsonm)    Streptococcus species NOT DETECTED NOT DETECTED Final   Streptococcus agalactiae NOT DETECTED NOT DETECTED Final   Streptococcus pneumoniae NOT DETECTED NOT DETECTED Final   Streptococcus pyogenes NOT DETECTED NOT DETECTED Final   Acinetobacter baumannii NOT DETECTED NOT DETECTED Final   Enterobacteriaceae species NOT DETECTED NOT DETECTED Final   Enterobacter cloacae complex NOT DETECTED NOT DETECTED Final   Escherichia coli NOT DETECTED NOT DETECTED Final   Klebsiella oxytoca NOT DETECTED NOT DETECTED Final   Klebsiella pneumoniae NOT DETECTED NOT DETECTED Final   Proteus species NOT DETECTED NOT DETECTED Final   Serratia marcescens NOT DETECTED NOT DETECTED Final   Haemophilus influenzae NOT DETECTED NOT DETECTED Final   Neisseria meningitidis NOT DETECTED NOT DETECTED Final   Pseudomonas aeruginosa NOT DETECTED NOT DETECTED Final   Candida albicans NOT DETECTED NOT DETECTED Final   Candida glabrata NOT DETECTED NOT DETECTED Final   Candida krusei NOT DETECTED NOT  DETECTED Final   Candida parapsilosis NOT DETECTED NOT DETECTED Final   Candida tropicalis NOT DETECTED NOT DETECTED Final    Comment: Performed at [facility], [place] [address],  [place], [place] [address]  Urine culture     Status: None   Collection Time: 08/18/17  9:46 PM  Result Value Ref Range Status   Specimen Description   Final    URINE, CLEAN CATCH Performed at [facility], [address] [place], [place] [address]    Special Requests   Final    NONE Performed at [facility], [address], [place], [place] [address]    Culture   Final    NO GROWTH Performed at [facility], [place] [address], [place], [place] [address]    Report Status 08/20/2017 FINAL  Final  Culture, blood (Routine x 2)     Status: Abnormal   Collection Time: 08/18/17  9:51 PM  Result Value Ref Range Status   Specimen Description   Final    BLOOD LEFT FOREARM Performed at [facility], [address], [place], [place] [address]    Special Requests   Final    BOTTLES DRAWN AEROBIC ONLY Blood Culture adequate volume Performed at [facility], [address], [place], [place] [address]    Culture  Setup Time   Final    GRAM POSITIVE COCCI Gram Stain Report Called to,Read Back By and Verified With: HOWARD,C. AT 2138 ON 08/19/2017 BY EVA AEROBIC BOTTLE ONLY Performed at [place] (COAGULASE NEGATIVE) (A)  Final   Report Status 08/22/2017 FINAL  Final   Organism ID, Bacteria STAPHYLOCOCCUS SPECIES (COAGULASE NEGATIVE)  Final      Susceptibility   Staphylococcus species (coagulase negative) - MIC*    CIPROFLOXACIN >=8 RESISTANT Resistant     ERYTHROMYCIN >=8 RESISTANT Resistant     GENTAMICIN >=16 RESISTANT Resistant     OXACILLIN >=4 RESISTANT Resistant     TETRACYCLINE 2 SENSITIVE Sensitive     VANCOMYCIN 1 SENSITIVE Sensitive     TRIMETH/SULFA 80 RESISTANT Resistant     CLINDAMYCIN >=8 RESISTANT Resistant     RIFAMPIN <=0.5 SENSITIVE Sensitive     Inducible Clindamycin NEGATIVE Sensitive     * STAPHYLOCOCCUS SPECIES (COAGULASE NEGATIVE)  Blood Culture ID Panel (Reflexed)     Status: Abnormal   Collection Time: 08/18/17  9:51 PM    Result Value Ref Range Status   Enterococcus species NOT DETECTED NOT DETECTED Final   Listeria monocytogenes NOT DETECTED NOT DETECTED Final   Staphylococcus species DETECTED (A) NOT DETECTED Final    Comment: Methicillin (oxacillin) resistant coagulase negative staphylococcus. Possible blood culture contaminant (unless isolated from more than one blood culture draw or clinical case suggests pathogenicity). No antibiotic treatment is indicated for blood  culture contaminants. CRITICAL RESULT CALLED TO, READ BACK BY AND VERIFIED WITH: TO SGANNONS(RN) BY TCLEVELAND 08/20/17 AT 5:44AM    Staphylococcus aureus NOT DETECTED NOT DETECTED Final   Methicillin resistance DETECTED (A) NOT DETECTED Final    Comment: CRITICAL RESULT CALLED TO, READ BACK BY AND VERIFIED WITH: TO SGANNONS(RN) BY TCLEVELAND 08/20/17 AT 5:44AM    Streptococcus species NOT DETECTED NOT DETECTED Final   Streptococcus agalactiae NOT DETECTED NOT DETECTED Final   Streptococcus pneumoniae NOT DETECTED NOT DETECTED Final   Streptococcus pyogenes NOT DETECTED NOT DETECTED Final   Acinetobacter baumannii NOT DETECTED NOT DETECTED Final   Enterobacteriaceae species NOT DETECTED NOT DETECTED Final  Enterobacter cloacae complex NOT DETECTED NOT DETECTED Final   Escherichia coli NOT DETECTED NOT DETECTED Final   Klebsiella oxytoca NOT DETECTED NOT DETECTED Final   Klebsiella pneumoniae NOT DETECTED NOT DETECTED Final   Proteus species NOT DETECTED NOT DETECTED Final   Serratia marcescens NOT DETECTED NOT DETECTED Final   Haemophilus influenzae NOT DETECTED NOT DETECTED Final   Neisseria meningitidis NOT DETECTED NOT DETECTED Final   Pseudomonas aeruginosa NOT DETECTED NOT DETECTED Final   Candida albicans NOT DETECTED NOT DETECTED Final   Candida glabrata NOT DETECTED NOT DETECTED Final   Candida krusei NOT DETECTED NOT DETECTED Final   Candida parapsilosis NOT DETECTED NOT DETECTED Final   Candida tropicalis NOT DETECTED NOT  DETECTED Final    Comment: Performed at [facility], [place] [address], [place], [place] [address]  MRSA PCR Screening     Status: None   Collection Time: 08/19/17  1:09 AM  Result Value Ref Range Status   MRSA by PCR NEGATIVE NEGATIVE Final    Comment:        The GeneXpert MRSA Assay (FDA approved for NASAL specimens only), is one component of a comprehensive MRSA colonization surveillance program. It is not intended to diagnose MRSA infection nor to guide or monitor treatment for MRSA infections. Performed at [facility], [address] [place], [place] [address]   Respiratory Panel by PCR     Status: None   Collection Time: 08/19/17  1:55 AM  Result Value Ref Range Status   Adenovirus NOT DETECTED NOT DETECTED Final   Coronavirus 229E NOT DETECTED NOT DETECTED Final   Coronavirus HKU1 NOT DETECTED NOT DETECTED Final   Coronavirus NL63 NOT DETECTED NOT DETECTED Final   Coronavirus OC43 NOT DETECTED NOT DETECTED Final   Metapneumovirus NOT DETECTED NOT DETECTED Final   Rhinovirus / Enterovirus NOT DETECTED NOT DETECTED Final   Influenza A NOT DETECTED NOT DETECTED Final   Influenza B NOT DETECTED NOT DETECTED Final   Parainfluenza Virus 1 NOT DETECTED NOT DETECTED Final   Parainfluenza Virus 2 NOT DETECTED NOT DETECTED Final   Parainfluenza Virus 3 NOT DETECTED NOT DETECTED Final   Parainfluenza Virus 4 NOT DETECTED NOT DETECTED Final   Respiratory Syncytial Virus NOT DETECTED NOT DETECTED Final   Bordetella pertussis NOT DETECTED NOT DETECTED Final   Chlamydophila pneumoniae NOT DETECTED NOT DETECTED Final   Mycoplasma pneumoniae NOT DETECTED NOT DETECTED Final    Comment: Performed at [facility], [place] [address] [place], [place] [address]  Culture, respiratory (NON-Expectorated)     Status: None   Collection Time: 08/19/17  2:40 AM  Result Value Ref Range Status   Specimen Description   Final    TRACHEAL ASPIRATE Performed at [facility], [address], [place], [place] [address]    Special Requests   Final    NONE Performed at [facility], [address], [place], [place] [address]    Gram Stain   Final    ABUNDANT WBC PRESENT, PREDOMINANTLY PMN RARE SQUAMOUS EPITHELIAL CELLS PRESENT ABUNDANT GRAM POSITIVE COCCI IN PAIRS IN CLUSTERS FEW GRAM POSITIVE RODS FEW GRAM NEGATIVE RODS    Culture   Final    Consistent with normal respiratory flora. Performed at [facility], [place] [address] [place], [place] [address]    Report Status 08/21/2017 FINAL  Final  Culture, blood (Routine X 2) w Reflex to ID Panel     Status: None   Collection Time:  08/20/17  7:51 AM  Result Value Ref Range Status   Specimen Description BLOOD LEFT HAND  Final   Special Requests   Final    BOTTLES DRAWN AEROBIC AND ANAEROBIC Blood Culture adequate volume   Culture   Final    NO GROWTH 5 DAYS Performed at [facility], [address], [place], [place] [address]    Report Status 08/25/2017 FINAL  Final  Culture, blood (Routine X 2) w Reflex to ID Panel     Status: None   Collection Time: 08/20/17  7:52 AM  Result Value Ref Range Status   Specimen Description BLOOD LEFT WRIST  Final   Special Requests   Final    BOTTLES DRAWN AEROBIC AND ANAEROBIC Blood Culture adequate volume   Culture   Final    NO GROWTH 5 DAYS Performed at [facility], [address] [place], [place] [address]    Report Status 08/25/2017 FINAL  Final         Radiology Studies: No results found.      Scheduled Meds: . allopurinol  100 mg Per Tube Daily  . amantadine  100 mg Per Tube BID  . apixaban  2.5 mg Per Tube BID  . chlorhexidine gluconate (MEDLINE KIT)  15 mL Mouth Rinse BID  . collagenase  1 application Topical Daily  . famotidine  20 mg Per Tube BID  . ipratropium-albuterol  3 mL Nebulization Q6H  . mouth rinse  15 mL Mouth Rinse QID  . metoprolol tartrate  2.5 mg Intravenous Q6H  . omega-3 acid ethyl esters  1 g Per Tube Daily  . polyethylene glycol   17 g Oral Daily  . sertraline  50 mg Per Tube Daily  . tamsulosin  0.4 mg Oral Daily   Continuous Infusions: . 0.45 % NaCl with KCl 20 mEq / L 75 mL/hr at 08/25/17 1542  . feeding supplement (VITAL AF 1.2 CAL) 1,000 mL (08/24/17 2048)  . fluconazole (DIFLUCAN) IV Stopped (08/24/17 2140)  . meropenem (MERREM) IV 1 g (08/25/17 1551)  . vancomycin Stopped (08/25/17 1115)     LOS: 7 days    Time spent: 25 minutes.     Lelon Frohlich, MD [facility] Pager [phone]  If 7PM-7AM, please contact night-coverage [email or web address] Password [facility] 08/25/2017, 3:51 PM

## 2017-08-25 NOTE — Progress Notes (Signed)
  Speech Language Pathology Treatment: Richard Mcpherson Speaking valve  Patient Details Name: Richard Mcpherson MRN: [id number] DOB: 16-Dec-1940 Today's Date: 08/25/2017 Time: 4098-1191 SLP Time Calculation (min) (ACUTE ONLY): 17 min  Assessment / Plan / Recommendation Clinical Impression  Pt donning PMSV upon SLP arrival this AM and appeared to be tolerating well (maintaining appropriate sats). Pt intermittently verbalizes with mod cues from SLP (able to tell wife's name, daughter's name, max cues for responsive naming). Pt without tracheal secretions and was able to cough oral secretions with assist from suction, occasional swallows elicited. Recommend continuation of PMSV during all waking hours as tolerated.    HPI HPI: Patient was admitted to [facility] on Friday from [facility]). Has a hx of CVA and is s/p Trach and PEG placement following what initially started as a fall at home      SLP Plan  Continue with current plan of care       Recommendations                   Plan: Continue with current plan of care       Thank you,  Richard Mcpherson, CCC-SLP [phone]                 Richard Mcpherson 08/25/2017, 12:04 PM

## 2017-08-26 NOTE — Progress Notes (Signed)
Subjective: According to his wife he had a good night.  No new complaints are noted.  He has been on the trach collar and done well.  No overt aspiration  Objective: Vital signs in last 24 hours: Temp:  [97.7 F (36.5 C)-98.9 F (37.2 C)] 98.8 F (37.1 C) (02/10 0808) Pulse Rate:  [79-91] 91 (02/10 0600) Resp:  [23-32] 29 (02/10 0600) BP: (112-130)/(59-68) 126/62 (02/10 0600) SpO2:  [91 %-97 %] 92 % (02/10 0902) FiO2 (%):  [28 %-32 %] 28 % (02/10 0902) Weight:  [84.3 kg (185 lb 13.6 oz)] 84.3 kg (185 lb 13.6 oz) (02/10 0500) Weight change: -1.1 kg (-6.8 oz) Last BM Date: 08/24/16  Intake/Output from previous day: 02/09 0701 - 02/10 0700 In: 4016.3 [I.V.:1856.3; NG/GT:1110; IV Piggyback:850] Out: 3550 [Urine:3550]  PHYSICAL EXAM General appearance: He is awake.  He follows with his eyes. Resp: rhonchi bilaterally Cardio: regular rate and rhythm, S1, S2 normal, no murmur, click, rub or gallop GI: soft, non-tender; bowel sounds normal; no masses,  no organomegaly Extremities: extremities normal, atraumatic, no cyanosis or edema Significant decubiti on his sacrum  Lab Results:  Results for orders placed or performed during the hospital encounter of 08/18/17 (from the past 48 hour(s))  Triglycerides     Status: None   Collection Time: 08/25/17  2:13 PM  Result Value Ref Range   Triglycerides 104 <150 mg/dL    Comment: Performed at [facility], [address], [place], [place] [address]    ABGS No results for input(s): PHART, PO2ART, TCO2, HCO3 in the last 72 hours.  Invalid input(s): PCO2 CULTURES Recent Results (from the past 240 hour(s))  Culture, blood (Routine x 2)     Status: Abnormal   Collection Time: 08/18/17  9:42 PM  Result Value Ref Range Status   Specimen Description   Final    LEFT ANTECUBITAL Performed at [facility], [address], [place], [place] [address]    Special Requests   Final    BOTTLES DRAWN AEROBIC AND ANAEROBIC Blood Culture adequate  volume Performed at [facility], [address], [place], [place] [address]    Culture  Setup Time   Final    GRAM POSITIVE COCCI Gram Stain Report Called to,Read Back By and Verified With: GAMMONS,S. AT 0203 ON 08/20/2017 BY EVA Performed at [facility]    Culture (A)  Final    STAPHYLOCOCCUS SPECIES (COAGULASE NEGATIVE) SUSCEPTIBILITIES PERFORMED ON PREVIOUS CULTURE WITHIN THE LAST 5 DAYS. Performed at [facility], [place] [address] [place], [place] [address]    Report Status 08/22/2017 FINAL  Final  Blood Culture ID Panel (Reflexed)     Status: Abnormal   Collection Time: 08/18/17  9:42 PM  Result Value Ref Range Status   Enterococcus species NOT DETECTED NOT DETECTED Final   Listeria monocytogenes NOT DETECTED NOT DETECTED Final   Staphylococcus species DETECTED (A) NOT DETECTED Final    Comment: Methicillin (oxacillin) resistant coagulase negative staphylococcus. Possible blood culture contaminant (unless isolated from more than one blood culture draw or clinical case suggests pathogenicity). No antibiotic treatment is indicated for blood  culture contaminants. L. Seay Pharm.D. 7:45 08/20/17 (wilsonm)    Staphylococcus aureus NOT DETECTED NOT DETECTED Final   Methicillin resistance DETECTED (A) NOT DETECTED Final    Comment: CRITICAL RESULT CALLED TO, READ BACK BY AND VERIFIED WITH: L. Seay Pharm.D. 7:45 08/20/17 ([email or web address])    Streptococcus species NOT DETECTED NOT DETECTED Final   Streptococcus agalactiae NOT DETECTED  NOT DETECTED Final   Streptococcus pneumoniae NOT DETECTED NOT DETECTED Final   Streptococcus pyogenes NOT DETECTED NOT DETECTED Final   Acinetobacter baumannii NOT DETECTED NOT DETECTED Final   Enterobacteriaceae species NOT DETECTED NOT DETECTED Final   Enterobacter cloacae complex NOT DETECTED NOT DETECTED Final   Escherichia coli NOT DETECTED NOT DETECTED Final   Klebsiella oxytoca NOT DETECTED NOT DETECTED Final   Klebsiella pneumoniae NOT  DETECTED NOT DETECTED Final   Proteus species NOT DETECTED NOT DETECTED Final   Serratia marcescens NOT DETECTED NOT DETECTED Final   Haemophilus influenzae NOT DETECTED NOT DETECTED Final   Neisseria meningitidis NOT DETECTED NOT DETECTED Final   Pseudomonas aeruginosa NOT DETECTED NOT DETECTED Final   Candida albicans NOT DETECTED NOT DETECTED Final   Candida glabrata NOT DETECTED NOT DETECTED Final   Candida krusei NOT DETECTED NOT DETECTED Final   Candida parapsilosis NOT DETECTED NOT DETECTED Final   Candida tropicalis NOT DETECTED NOT DETECTED Final    Comment: Performed at [facility], [place] [address], [place], [place] [address]  Urine culture     Status: None   Collection Time: 08/18/17  9:46 PM  Result Value Ref Range Status   Specimen Description   Final    URINE, CLEAN CATCH Performed at [facility], [address] [place], [place] [address]    Special Requests   Final    NONE Performed at [facility], [address] [place], [place] [address]    Culture   Final    NO GROWTH Performed at [facility], [place] [address], [place], [place] [address]    Report Status 08/20/2017 FINAL  Final  Culture, blood (Routine x 2)     Status: Abnormal   Collection Time: 08/18/17  9:51 PM  Result Value Ref Range Status   Specimen Description   Final    BLOOD LEFT FOREARM Performed at [facility], [address] [place], [place] [address]    Special Requests   Final    BOTTLES DRAWN AEROBIC ONLY Blood Culture adequate volume Performed at [facility], [address], [place], [place] [address]    Culture  Setup Time   Final    GRAM POSITIVE COCCI Gram Stain Report Called to,Read Back By and Verified With: HOWARD,C. AT 2138 ON 08/19/2017 BY EVA AEROBIC BOTTLE ONLY Performed at [facility] (COAGULASE NEGATIVE) (A)  Final   Report Status 08/22/2017 FINAL  Final   Organism ID, Bacteria STAPHYLOCOCCUS SPECIES (COAGULASE NEGATIVE)   Final      Susceptibility   Staphylococcus species (coagulase negative) - MIC*    CIPROFLOXACIN >=8 RESISTANT Resistant     ERYTHROMYCIN >=8 RESISTANT Resistant     GENTAMICIN >=16 RESISTANT Resistant     OXACILLIN >=4 RESISTANT Resistant     TETRACYCLINE 2 SENSITIVE Sensitive     VANCOMYCIN 1 SENSITIVE Sensitive     TRIMETH/SULFA 80 RESISTANT Resistant     CLINDAMYCIN >=8 RESISTANT Resistant     RIFAMPIN <=0.5 SENSITIVE Sensitive     Inducible Clindamycin NEGATIVE Sensitive     * STAPHYLOCOCCUS SPECIES (COAGULASE NEGATIVE)  Blood Culture ID Panel (Reflexed)     Status: Abnormal   Collection Time: 08/18/17  9:51 PM  Result Value Ref Range Status   Enterococcus species NOT DETECTED NOT DETECTED Final   Listeria monocytogenes NOT DETECTED NOT DETECTED Final   Staphylococcus species DETECTED (A) NOT DETECTED Final    Comment: Methicillin (  oxacillin) resistant coagulase negative staphylococcus. Possible blood culture contaminant (unless isolated from more than one blood culture draw or clinical case suggests pathogenicity). No antibiotic treatment is indicated for blood  culture contaminants. CRITICAL RESULT CALLED TO, READ BACK BY AND VERIFIED WITH: TO SGANNONS(RN) BY TCLEVELAND 08/20/17 AT 5:44AM    Staphylococcus aureus NOT DETECTED NOT DETECTED Final   Methicillin resistance DETECTED (A) NOT DETECTED Final    Comment: CRITICAL RESULT CALLED TO, READ BACK BY AND VERIFIED WITH: TO SGANNONS(RN) BY TCLEVELAND 08/20/17 AT 5:44AM    Streptococcus species NOT DETECTED NOT DETECTED Final   Streptococcus agalactiae NOT DETECTED NOT DETECTED Final   Streptococcus pneumoniae NOT DETECTED NOT DETECTED Final   Streptococcus pyogenes NOT DETECTED NOT DETECTED Final   Acinetobacter baumannii NOT DETECTED NOT DETECTED Final   Enterobacteriaceae species NOT DETECTED NOT DETECTED Final   Enterobacter cloacae complex NOT DETECTED NOT DETECTED Final   Escherichia coli NOT DETECTED NOT DETECTED Final    Klebsiella oxytoca NOT DETECTED NOT DETECTED Final   Klebsiella pneumoniae NOT DETECTED NOT DETECTED Final   Proteus species NOT DETECTED NOT DETECTED Final   Serratia marcescens NOT DETECTED NOT DETECTED Final   Haemophilus influenzae NOT DETECTED NOT DETECTED Final   Neisseria meningitidis NOT DETECTED NOT DETECTED Final   Pseudomonas aeruginosa NOT DETECTED NOT DETECTED Final   Candida albicans NOT DETECTED NOT DETECTED Final   Candida glabrata NOT DETECTED NOT DETECTED Final   Candida krusei NOT DETECTED NOT DETECTED Final   Candida parapsilosis NOT DETECTED NOT DETECTED Final   Candida tropicalis NOT DETECTED NOT DETECTED Final    Comment: Performed at [facility], [place] [address] [place], [place] [address]  MRSA PCR Screening     Status: None   Collection Time: 08/19/17  1:09 AM  Result Value Ref Range Status   MRSA by PCR NEGATIVE NEGATIVE Final    Comment:        The GeneXpert MRSA Assay (FDA approved for NASAL specimens only), is one component of a comprehensive MRSA colonization surveillance program. It is not intended to diagnose MRSA infection nor to guide or monitor treatment for MRSA infections. Performed at [facility], [address] [place], [place] [address]   Respiratory Panel by PCR     Status: None   Collection Time: 08/19/17  1:55 AM  Result Value Ref Range Status   Adenovirus NOT DETECTED NOT DETECTED Final   Coronavirus 229E NOT DETECTED NOT DETECTED Final   Coronavirus HKU1 NOT DETECTED NOT DETECTED Final   Coronavirus NL63 NOT DETECTED NOT DETECTED Final   Coronavirus OC43 NOT DETECTED NOT DETECTED Final   Metapneumovirus NOT DETECTED NOT DETECTED Final   Rhinovirus / Enterovirus NOT DETECTED NOT DETECTED Final   Influenza A NOT DETECTED NOT DETECTED Final   Influenza B NOT DETECTED NOT DETECTED Final   Parainfluenza Virus 1 NOT DETECTED NOT DETECTED Final   Parainfluenza Virus 2 NOT DETECTED NOT DETECTED Final   Parainfluenza Virus 3 NOT  DETECTED NOT DETECTED Final   Parainfluenza Virus 4 NOT DETECTED NOT DETECTED Final   Respiratory Syncytial Virus NOT DETECTED NOT DETECTED Final   Bordetella pertussis NOT DETECTED NOT DETECTED Final   Chlamydophila pneumoniae NOT DETECTED NOT DETECTED Final   Mycoplasma pneumoniae NOT DETECTED NOT DETECTED Final    Comment: Performed at [facility], [place] [address] [place], [place] [address]  Culture, respiratory (NON-Expectorated)     Status: None   Collection Time: 08/19/17  2:40 AM  Result Value Ref Range Status   Specimen Description   Final    TRACHEAL ASPIRATE Performed at [facility], [address], [place], [place] [address]    Special Requests   Final    NONE Performed at [facility], [address], [place], [place] [address]    Gram Stain   Final    ABUNDANT WBC PRESENT, PREDOMINANTLY PMN RARE SQUAMOUS EPITHELIAL CELLS PRESENT ABUNDANT GRAM POSITIVE COCCI IN PAIRS IN CLUSTERS FEW GRAM POSITIVE RODS FEW GRAM NEGATIVE RODS    Culture   Final    Consistent with normal respiratory flora. Performed at [facility], [place] [address], [place], [place] [address]    Report Status 08/21/2017 FINAL  Final  Culture, blood (Routine X 2) w Reflex to ID Panel     Status: None   Collection Time: 08/20/17  7:51 AM  Result Value Ref Range Status   Specimen Description BLOOD LEFT HAND  Final   Special Requests   Final    BOTTLES DRAWN AEROBIC AND ANAEROBIC Blood Culture adequate volume   Culture   Final    NO GROWTH 5 DAYS Performed at [facility], [address], [place], [place] [address]    Report Status 08/25/2017 FINAL  Final  Culture, blood (Routine X 2) w Reflex to ID Panel     Status: None   Collection Time: 08/20/17  7:52 AM  Result Value Ref Range Status   Specimen Description BLOOD LEFT WRIST  Final   Special Requests   Final    BOTTLES DRAWN AEROBIC AND ANAEROBIC Blood Culture adequate volume   Culture   Final    NO GROWTH 5 DAYS Performed at [facility], [address] [place], [place] [address]    Report Status 08/25/2017 FINAL  Final   Studies/Results: No results found.  Medications:  Prior to Admission:  Medications Prior to Admission  Medication Sig Dispense Refill Last Dose  . allopurinol (ZYLOPRIM) 100 MG tablet Place 100 mg into feeding tube 3 (three) times a week. Every Tuesday, Thursday, Sunday   08/16/2017 at Unknown time  . amantadine (SYMMETREL) 100 MG capsule Place 100 mg into feeding tube 2 (two) times daily.   08/18/2017 at Unknown time  . apixaban (ELIQUIS) 2.5 MG TABS tablet Place 2.5 mg into feeding tube 2 (two) times daily.   08/18/2017 at 1700  . collagenase (SANTYL) ointment Apply 1 application topically daily. Right buttocks area   08/18/2017 at Unknown time  . famotidine (PEPCID) 20 MG tablet Place 20 mg into feeding tube 2 (two) times daily.   08/18/2017 at Unknown time  . HYDROcodone-acetaminophen (NORCO/VICODIN) 5-325 MG tablet Place 1 tablet into feeding tube every 6 (six) hours as needed for moderate pain.   unknown  . Melatonin 3 MG TABS Place 3 mg into feeding tube at bedtime.   08/18/2017 at Unknown time  . Metoprolol Tartrate 37.5 MG TABS Place 1 tablet into feeding tube daily.   08/18/2017 at 800a  . modafinil (PROVIGIL) 100 MG tablet Place 100 mg into feeding tube daily.   08/18/2017 at Unknown time  . Omega-3 Fatty Acids (FISH OIL) 1000 MG CAPS Place 1 capsule into feeding tube daily.   08/18/2017 at Unknown time  . polyethylene glycol powder (GLYCOLAX/MIRALAX) powder Take 17 g by mouth daily.   08/18/2017 at Unknown time  . sertraline (ZOLOFT) 50 MG tablet Place 50 mg into feeding tube daily.   08/18/2017 at Unknown time  . tamsulosin (FLOMAX)  0.4 MG CAPS capsule 0.4 mg daily. Per G-Tube   08/18/2017 at Unknown time   Scheduled: . allopurinol  100 mg Per Tube Daily  . amantadine  100 mg Per Tube BID  . apixaban  2.5 mg Per Tube BID  . chlorhexidine gluconate (MEDLINE KIT)  15 mL Mouth Rinse BID  . collagenase  1  application Topical Daily  . famotidine  20 mg Per Tube BID  . ipratropium-albuterol  3 mL Nebulization Q6H  . mouth rinse  15 mL Mouth Rinse QID  . metoprolol tartrate  2.5 mg Intravenous Q6H  . omega-3 acid ethyl esters  1 g Per Tube Daily  . polyethylene glycol  17 g Oral Daily  . sertraline  50 mg Per Tube Daily  . tamsulosin  0.4 mg Oral Daily   Continuous: . 0.45 % NaCl with KCl 20 mEq / L 75 mL/hr at 08/26/17 0000  . feeding supplement (VITAL AF 1.2 CAL) 1,000 mL (08/26/17 0835)  . fluconazole (DIFLUCAN) IV Stopped (08/25/17 1958)  . meropenem (MERREM) IV Stopped (08/26/17 0535)  . vancomycin 1,250 mg (08/26/17 0826)   SVX:[id number] **OR** acetaminophen, midazolam, ondansetron **OR** ondansetron (ZOFRAN) IV  Assesment: He was admitted with sepsis from urinary tract infection and from aspiration pneumonia.  He is no longer septic.  He is growing coag negative staph with methicillin resistance and he is on vancomycin for that.  He has acute on chronic hypoxic respiratory failure and required ventilator support for about 48 hours.  He had air hunger and was unable to maintain his oxygenation despite trach collar at 100%.  He was able to be taken off the ventilator on 2 6 and he has done well since then with trach collar and occasionally with nasal cannula.  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  It appears that he aspirated at the nursing home.  Speech therapy is working with him using a Passy-Muir valve to see if he can speak.  He has been able to speak but it is difficult and he is difficult to understand.  His problems started when he had a stroke and fell.  He had contusion in his cervical spine.  He received TPA for the stroke and later had subdural hematoma.  He had cardiac arrest which was thought to be secondary to a cervical spine contusion and he had C6 fusion and C1-C2 and C3 laminectomy he initially was able to be extubated but later had to be reintubated  and then eventually was given a tracheostomy he had prolonged ventilator support eventually was weaned off and was discharged to a skilled care facility but almost immediately developed fever and sepsis.  He has malnutrition and is getting tube feedings  He has sacral decubitus multifactorial which is being treated Principal Problem:   Sepsis ([place]) Active Problems:   Hyperlipidemia   Essential hypertension   CAD, NATIVE VESSEL   Stroke (cerebrum) (HCC)   SAH (subarachnoid hemorrhage) (HCC)   Pneumonia   Acute hypoxemic respiratory failure (HCC)   Acute on chronic respiratory failure with hypoxia (HCC)   Sepsis due to undetermined organism ([employer])   Aspiration pneumonia of both lower lobes due to gastric secretions (HCC)   Tracheostomy status ([place])   Deep vein thrombosis (DVT) of right upper extremity (HCC)   Transaminasemia   Lobar pneumonia ([place])   Sacral decubitus ulcer, stage III (HCC)   Staphylococcal sepsis (HCC)   Healthcare-associated pneumonia   Malnutrition of moderate degree   Palliative  care encounter   Acute respiratory failure with hypoxemia ([place])    Plan: Continue treatments.  He is slowly improving.    LOS: 8 days   Akeen Ledyard L 08/26/2017, 9:07 AM

## 2017-08-26 NOTE — Progress Notes (Signed)
PROGRESS NOTE    Richard Mcpherson  HBZ:[id number] DOB: 1941/05/16 DOA: 08/18/2017 PCP: [name], MD     Brief Narrative:  77 year old man admitted from SNF on 2/2 with complaints of fever and chills.  He has a very complex medical history.  I have reviewed his recent hospitalization from [place] in detail.  Was initially admitted to [facility] rocking him on 06/19/2017 after an unwitnessed fall and was noted to have acute mental status changes with right hemiparesis, from there was transferred to [name] he was found to have an acute CVA for which she received IV TPA.  He then had a prolonged hospitalization with numerous complications.  Subsequently noted to have a subdural hematoma on MRI.  He had cardiac arrest with asystole which was felt to be due to to his cervical spine contusion, taken to surgery on 12/19 for an occipital to C6 fusion with C1, C2 and C3 laminectomies.  Was able to be extubated but required reintubation on 12/19.  Hospitalization was further complicated by right axillary vein DVT resulting from a right upper extremity PICC line.  He was started on a heparin drip.  He had a repeat MRI of the brain on 12/29 that showed resolving subdural hematoma.  Subsequently developed Enterobacter ventilator associated pneumonia treated with Zosyn, unfortunately he continues to have fevers and difficulty weaning from the ventilator.  Had a tracheostomy and gastrostomy tube placed during his hospitalization.  [facility] where he was weaned off the ventilator and discharged to skilled nursing facility on 08/17/17.  He was subsequently sent back to our hospital on 2/2 with fevers.  Here he was started on vancomycin and Zosyn initially, 2 out of 2 blood cultures have grown out coag negative staph with methicillin resistance, he continues on vancomycin.  Repeat blood cultures on 2/4 are negative.  He was able to be weaned from the ventilator on 2/6 to  trach collar.  Speech therapy is now working on placing Passy-Muir valve to see if he can speak some words.  Palliative care is involved.   Assessment & Plan:   Principal Problem:   Sepsis ([place]) Active Problems:   Hyperlipidemia   Essential hypertension   CAD, NATIVE VESSEL   Stroke (cerebrum) (HCC)   SAH (subarachnoid hemorrhage) (HCC)   Pneumonia   Acute hypoxemic respiratory failure (HCC)   Acute on chronic respiratory failure with hypoxia (HCC)   Sepsis due to undetermined organism ([place])   Aspiration pneumonia of both lower lobes due to gastric secretions (HCC)   Tracheostomy status ([place])   Deep vein thrombosis (DVT) of right upper extremity (HCC)   Transaminasemia   Lobar pneumonia ([place])   Sacral decubitus ulcer, stage III (HCC)   Staphylococcal sepsis ([place])   Healthcare-associated pneumonia   Malnutrition of moderate degree   Palliative care encounter   Acute respiratory failure with hypoxemia (HCC)   Sepsis -Secondary to pneumonia and coag negative staph bacteremia. -Continue meropenem and vancomycin as there is concern for aspiration. -Reviewed echo report from 08/20/17: EF 65-70%, no wall motion abnormalities, no vegetations. -Discussed case via phone with Dr. Megan Salon, infectious diseases, who recommends continuing antibiotics for total of 7-10 days.  Discussed with pharmacy as well. Last day is Sunday.  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 -Patient was started on mechanical ventilation on 2/3 due to respiratory distress and worsening hypoxemia, was able to be extubated on 2/6. -Dr.  Hawkins following. -CT scan of the chest without PE but significant left lower lobe infiltrate and consolidation. 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 -Per wife is more alert, she states he has been able to follow some commands by blinking once or twice for yes or no questions. Also some words with Passy-Muir valve.  Stage III sacral decubitus ulcer -Wound care following.  Severe protein caloric malnutrition -Continue enteral feedings and supplements as per dietitian recommendations.  Oral thrush -Continue IV  fluconazole for 5 days, Today is day 4/5.   DVT prophylaxis: Eliquis Code Status: Full code Family Communication: Discussed in detail with wife at bedside. Disposition Plan: Keep in ICU today.  Continue palliative care discussions given very poor short and long-term prognosis.  Doubt patient's wife will be interested in palliative care at least this admission. Plan for return to SNF early next week.  Consultants:   Pulmonology  Palliative care  Procedures:   Echo as above  Antimicrobials:  Anti-infectives (From admission, onward)   Start     Dose/Rate Route Frequency Ordered Stop   08/24/17 0900  vancomycin (VANCOCIN) 1,250 mg in sodium chloride 0.9 % 250 mL IVPB     1,250 mg 166.7 mL/hr over 90 Minutes Intravenous Every 12 hours 08/23/17 2139 08/26/17 2359   08/23/17 1800  fluconazole (DIFLUCAN) IVPB 100 mg  Status:  Discontinued     100 mg 50 mL/hr over 60 Minutes Intravenous Every 24 hours 08/23/17 1649 08/23/17 1702   08/23/17 1800  fluconazole (DIFLUCAN) IVPB 100 mg     100 mg 50 mL/hr over 60 Minutes Intravenous Every 24 hours 08/23/17 1702     08/20/17 2030  vancomycin (VANCOCIN) IVPB 1000 mg/200 mL premix     1,000 mg 200 mL/hr over 60 Minutes Intravenous Every 12 hours 08/20/17 0834 08/23/17 2301   08/20/17 0830  vancomycin (VANCOCIN) 1,500 mg in sodium chloride 0.9 % 500 mL IVPB     1,500 mg 250 mL/hr over 120 Minutes Intravenous  Once 08/20/17 0818 08/20/17 1125   08/19/17 1800  vancomycin (VANCOCIN) IVPB 750 mg/150  ml premix  Status:  Discontinued     750 mg 150 mL/hr over 60 Minutes Intravenous Every 12 hours 08/19/17 1026 08/19/17 1425   08/19/17 0900  meropenem (MERREM) 2 g in sodium chloride 0.9 % 100 mL IVPB  Status:  Discontinued     2 g 200 mL/hr over 30 Minutes Intravenous Every 8 hours 08/19/17 0837 08/19/17 0839   08/19/17 0900  meropenem (MERREM) 1 g in sodium chloride 0.9 % 100 mL IVPB     1 g 200 mL/hr over 30 Minutes Intravenous Every 8 hours 08/19/17 0839 08/26/17 2359   08/19/17 0600  piperacillin-tazobactam (ZOSYN) IVPB 3.375 g  Status:  Discontinued     3.375 g 12.5 mL/hr over 240 Minutes Intravenous Every 8 hours 08/18/17 2316 08/19/17 0825   08/19/17 0600  vancomycin (VANCOCIN) IVPB 1000 mg/200 mL premix     1,000 mg 200 mL/hr over 60 Minutes Intravenous  Once 08/18/17 2318 08/19/17 0610   08/18/17 2200  piperacillin-tazobactam (ZOSYN) IVPB 3.375 g     3.375 g 100 mL/hr over 30 Minutes Intravenous  Once 08/18/17 2159 08/18/17 2252   08/18/17 2200  vancomycin (VANCOCIN) IVPB 1000 mg/200 mL premix  1,000 mg 200 mL/hr over 60 Minutes Intravenous  Once 08/18/17 2159 08/18/17 2307       Subjective: In bed, eyes open, nonverbal again today at least with me  Objective: Vitals:   08/26/17 0902 08/26/17 1100 08/26/17 1439 08/26/17 1458  BP:  117/69    Pulse:  87    Resp:  (!) 28    Temp:   98.1 F (36.7 C)   TempSrc:   Axillary   SpO2: 92% 94%  92%  Weight:      Height:        Intake/Output Summary (Last 24 hours) at 08/26/2017 1637 Last data filed at 08/26/2017 1115 Gross per 24 hour  Intake 2683.75 ml  Output 3800 ml  Net -1116.25 ml   Filed Weights   08/24/17 0600 08/25/17 0400 08/26/17 0500  Weight: 81.5 kg (179 lb 10.8 oz) 85.4 kg (188 lb 4.4 oz) 84.3 kg (185 lb 13.6 oz)    Examination:   General exam: Awake, unable to track with his eyes, nonverbal with me Respiratory system: Clear to auscultation. Respiratory effort normal. Cardiovascular system:RRR.  No murmurs, rubs, gallops. Gastrointestinal system: Abdomen is nondistended, soft and nontender. No organomegaly or masses felt. Normal bowel sounds heard. Central nervous system: Awake, no purposeful movements of extremities are noted. Extremities: No C/C/E, +pedal pulses Skin: No rashes, lesions or ulcers Psychiatry: Unable to assess given nonverbal state       Data Reviewed: I have personally reviewed following labs and imaging studies  CBC: Recent Labs  Lab 08/20/17 0405 08/21/17 1258 08/22/17 0531 08/24/17 0451  WBC 20.9* 12.5* 9.5 7.7  HGB 9.8* 8.4* 8.2* 8.8*  HCT 33.5* 28.8* 27.5* 29.5*  MCV 89.1 87.8 86.8 85.0  PLT 263 224 201 459   Basic Metabolic Panel: Recent Labs  Lab 08/20/17 0405 08/21/17 1258 08/22/17 0531 08/24/17 0451  NA 144 141 139 135  K 3.7 3.4* 3.2* 3.1*  CL 108 110 107 101  CO2 _0 GLUCOSE 93 112* 108* 107*  BUN 34* 31* 27* 15  CREATININE 0.41* 0.30* <0.30* <0.30*  CALCIUM 8.7* 8.3* 8.4* 8.3*  MG 1.9  --   --   --   PHOS 3.2  --   --   --    GFR: CrCl cannot be calculated (This lab value cannot be used to calculate CrCl because it is not a number: <0.30). Liver Function Tests: Recent Labs  Lab 08/20/17 0405 08/21/17 1258  AST 24 52*  ALT 50 56  ALKPHOS 260* 279*  BILITOT 1.2 0.9  PROT 6.1* 5.6*  ALBUMIN 1.9* 1.6*   No results for input(s): LIPASE, AMYLASE in the last 168 hours. No results for input(s): AMMONIA in the last 168 hours. Coagulation Profile: No results for input(s): INR, PROTIME in the last 168 hours. Cardiac Enzymes: No results for input(s): CKTOTAL, CKMB, CKMBINDEX, TROPONINI in the last 168 hours. BNP (last 3 results) No results for input(s): PROBNP in the last 8760 hours. HbA1C: No results for input(s): HGBA1C in the last 72 hours. CBG: Recent Labs  Lab 08/21/17 1608  GLUCAP 120*   Lipid Profile: Recent Labs    08/25/17 1413  TRIG 104   Thyroid Function Tests: No results for input(s):  TSH, T4TOTAL, FREET4, T3FREE, THYROIDAB in the last 72 hours. Anemia Panel: No results for input(s): VITAMINB12, FOLATE, FERRITIN, TIBC, IRON, RETICCTPCT in the last 72 hours. Urine analysis:    Component Value Date/Time   COLORURINE AMBER (A) 08/18/2017  2146   APPEARANCEUR HAZY (A) 08/18/2017 2146   LABSPEC 1.025 08/18/2017 2146   PHURINE 6.0 08/18/2017 2146   GLUCOSEU NEGATIVE 08/18/2017 2146   HGBUR MODERATE (A) 08/18/2017 2146   BILIRUBINUR NEGATIVE 08/18/2017 2146   [place] NEGATIVE 08/18/2017 2146   PROTEINUR 30 (A) 08/18/2017 2146   NITRITE NEGATIVE 08/18/2017 2146   LEUKOCYTESUR NEGATIVE 08/18/2017 2146   Sepsis Labs: _0 (procalcitonin:4,lacticidven:4)  ) Recent Results (from the past 240 hour(s))  Culture, blood (Routine x 2)     Status: Abnormal   Collection Time: 08/18/17  9:42 PM  Result Value Ref Range Status   Specimen Description   Final    LEFT ANTECUBITAL Performed at [facility], [address], [place], [place] [address]    Special Requests   Final    BOTTLES DRAWN AEROBIC AND ANAEROBIC Blood Culture adequate volume Performed at [facility], [address], [place], [place] [address]    Culture  Setup Time   Final    GRAM POSITIVE COCCI Gram Stain Report Called to,Read Back By and Verified With: GAMMONS,S. AT 0203 ON 08/20/2017 BY EVA Performed at [facility]    Culture (A)  Final    STAPHYLOCOCCUS SPECIES (COAGULASE NEGATIVE) SUSCEPTIBILITIES PERFORMED ON PREVIOUS CULTURE WITHIN THE LAST 5 DAYS. Performed at [facility], [place] [address] [place], [place] [address]    Report Status 08/22/2017 FINAL  Final  Blood Culture ID Panel (Reflexed)     Status: Abnormal   Collection Time: 08/18/17  9:42 PM  Result Value Ref Range Status   Enterococcus species NOT DETECTED NOT DETECTED Final   Listeria monocytogenes NOT DETECTED NOT DETECTED Final   Staphylococcus species DETECTED (A) NOT DETECTED Final    Comment: Methicillin  (oxacillin) resistant coagulase negative staphylococcus. Possible blood culture contaminant (unless isolated from more than one blood culture draw or clinical case suggests pathogenicity). No antibiotic treatment is indicated for blood  culture contaminants. L. Seay Pharm.D. 7:45 08/20/17 (wilsonm)    Staphylococcus aureus NOT DETECTED NOT DETECTED Final   Methicillin resistance DETECTED (A) NOT DETECTED Final    Comment: CRITICAL RESULT CALLED TO, READ BACK BY AND VERIFIED WITH: L. Seay Pharm.D. 7:45 08/20/17 (wilsonm)    Streptococcus species NOT DETECTED NOT DETECTED Final   Streptococcus agalactiae NOT DETECTED NOT DETECTED Final   Streptococcus pneumoniae NOT DETECTED NOT DETECTED Final   Streptococcus pyogenes NOT DETECTED NOT DETECTED Final   Acinetobacter baumannii NOT DETECTED NOT DETECTED Final   Enterobacteriaceae species NOT DETECTED NOT DETECTED Final   Enterobacter cloacae complex NOT DETECTED NOT DETECTED Final   Escherichia coli NOT DETECTED NOT DETECTED Final   Klebsiella oxytoca NOT DETECTED NOT DETECTED Final   Klebsiella pneumoniae NOT DETECTED NOT DETECTED Final   Proteus species NOT DETECTED NOT DETECTED Final   Serratia marcescens NOT DETECTED NOT DETECTED Final   Haemophilus influenzae NOT DETECTED NOT DETECTED Final   Neisseria meningitidis NOT DETECTED NOT DETECTED Final   Pseudomonas aeruginosa NOT DETECTED NOT DETECTED Final   Candida albicans NOT DETECTED NOT DETECTED Final   Candida glabrata NOT DETECTED NOT DETECTED Final   Candida krusei NOT DETECTED NOT DETECTED Final   Candida parapsilosis NOT DETECTED NOT DETECTED Final   Candida tropicalis NOT DETECTED NOT DETECTED Final    Comment: Performed at [facility],  [address], [place], [place] [address]  Urine culture     Status: None   Collection Time: 08/18/17  9:46 PM  Result Value Ref Range  Status   Specimen Description   Final    URINE, CLEAN CATCH Performed at [facility], [address] [place], [place] [address]    Special Requests   Final    NONE Performed at [facility], [address] [place], [place] [address]    Culture   Final    NO GROWTH Performed at [facility], [place] [address] [place], [place] [address]    Report Status 08/20/2017 FINAL  Final  Culture, blood (Routine x 2)     Status: Abnormal   Collection Time: 08/18/17  9:51 PM  Result Value Ref Range Status   Specimen Description   Final    BLOOD LEFT FOREARM Performed at [facility], [address], [place], [place] [address]    Special Requests   Final    BOTTLES DRAWN AEROBIC ONLY Blood Culture adequate volume Performed at [facility], [address], [place], [place] [address]    Culture  Setup Time   Final    GRAM POSITIVE COCCI Gram Stain Report Called to,Read Back By and Verified With: HOWARD,C. AT 2138 ON 08/19/2017 BY EVA AEROBIC BOTTLE ONLY Performed at [place] (COAGULASE NEGATIVE) (A)  Final   Report Status 08/22/2017 FINAL  Final   Organism ID, Bacteria STAPHYLOCOCCUS SPECIES (COAGULASE NEGATIVE)  Final      Susceptibility   Staphylococcus species (coagulase negative) - MIC*    CIPROFLOXACIN >=8 RESISTANT Resistant     ERYTHROMYCIN >=8 RESISTANT Resistant     GENTAMICIN >=16 RESISTANT Resistant     OXACILLIN >=4 RESISTANT Resistant     TETRACYCLINE 2 SENSITIVE Sensitive     VANCOMYCIN 1 SENSITIVE Sensitive     TRIMETH/SULFA 80 RESISTANT Resistant     CLINDAMYCIN >=8 RESISTANT Resistant     RIFAMPIN <=0.5 SENSITIVE Sensitive     Inducible Clindamycin NEGATIVE Sensitive     * STAPHYLOCOCCUS SPECIES (COAGULASE NEGATIVE)  Blood Culture ID Panel (Reflexed)     Status: Abnormal   Collection Time: 08/18/17  9:51 PM  Result Value Ref Range Status   Enterococcus species NOT DETECTED NOT DETECTED Final   Listeria monocytogenes NOT DETECTED NOT DETECTED Final   Staphylococcus species DETECTED (A) NOT DETECTED Final    Comment:  Methicillin (oxacillin) resistant coagulase negative staphylococcus. Possible blood culture contaminant (unless isolated from more than one blood culture draw or clinical case suggests pathogenicity). No antibiotic treatment is indicated for blood  culture contaminants. CRITICAL RESULT CALLED TO, READ BACK BY AND VERIFIED WITH: TO SGANNONS(RN) BY TCLEVELAND 08/20/17 AT 5:44AM    Staphylococcus aureus NOT DETECTED NOT DETECTED Final   Methicillin resistance DETECTED (A) NOT DETECTED Final    Comment: CRITICAL RESULT CALLED TO, READ BACK BY AND VERIFIED WITH: TO SGANNONS(RN) BY TCLEVELAND 08/20/17 AT 5:44AM    Streptococcus species NOT DETECTED NOT DETECTED Final   Streptococcus agalactiae NOT DETECTED NOT DETECTED Final   Streptococcus pneumoniae NOT DETECTED NOT DETECTED Final   Streptococcus pyogenes NOT DETECTED NOT DETECTED Final   Acinetobacter baumannii NOT DETECTED NOT DETECTED Final   Enterobacteriaceae species NOT DETECTED NOT DETECTED Final   Enterobacter cloacae complex NOT DETECTED NOT DETECTED Final   Escherichia coli NOT DETECTED NOT DETECTED Final   Klebsiella oxytoca NOT DETECTED NOT DETECTED Final   Klebsiella pneumoniae NOT DETECTED NOT DETECTED Final   Proteus species NOT DETECTED NOT DETECTED Final   Serratia marcescens NOT DETECTED NOT DETECTED Final   Haemophilus influenzae NOT  DETECTED NOT DETECTED Final   Neisseria meningitidis NOT DETECTED NOT DETECTED Final   Pseudomonas aeruginosa NOT DETECTED NOT DETECTED Final   Candida albicans NOT DETECTED NOT DETECTED Final   Candida glabrata NOT DETECTED NOT DETECTED Final   Candida krusei NOT DETECTED NOT DETECTED Final   Candida parapsilosis NOT DETECTED NOT DETECTED Final   Candida tropicalis NOT DETECTED NOT DETECTED Final    Comment: Performed at [facility], [place] [address] [place], [place] [address]  MRSA PCR Screening     Status: None   Collection Time: 08/19/17  1:09 AM  Result Value Ref Range Status    MRSA by PCR NEGATIVE NEGATIVE Final    Comment:        The GeneXpert MRSA Assay (FDA approved for NASAL specimens only), is one component of a comprehensive MRSA colonization surveillance program. It is not intended to diagnose MRSA infection nor to guide or monitor treatment for MRSA infections. Performed at [facility], [address], [place], [place] [address]   Respiratory Panel by PCR     Status: None   Collection Time: 08/19/17  1:55 AM  Result Value Ref Range Status   Adenovirus NOT DETECTED NOT DETECTED Final   Coronavirus 229E NOT DETECTED NOT DETECTED Final   Coronavirus HKU1 NOT DETECTED NOT DETECTED Final   Coronavirus NL63 NOT DETECTED NOT DETECTED Final   Coronavirus OC43 NOT DETECTED NOT DETECTED Final   Metapneumovirus NOT DETECTED NOT DETECTED Final   Rhinovirus / Enterovirus NOT DETECTED NOT DETECTED Final   Influenza A NOT DETECTED NOT DETECTED Final   Influenza B NOT DETECTED NOT DETECTED Final   Parainfluenza Virus 1 NOT DETECTED NOT DETECTED Final   Parainfluenza Virus 2 NOT DETECTED NOT DETECTED Final   Parainfluenza Virus 3 NOT DETECTED NOT DETECTED Final   Parainfluenza Virus 4 NOT DETECTED NOT DETECTED Final   Respiratory Syncytial Virus NOT DETECTED NOT DETECTED Final   Bordetella pertussis NOT DETECTED NOT DETECTED Final   Chlamydophila pneumoniae NOT DETECTED NOT DETECTED Final   Mycoplasma pneumoniae NOT DETECTED NOT DETECTED Final    Comment: Performed at [facility], [place] [address] [place], [place] [address]  Culture, respiratory (NON-Expectorated)     Status: None   Collection Time: 08/19/17  2:40 AM  Result Value Ref Range Status   Specimen Description   Final    TRACHEAL ASPIRATE Performed at [facility], [address] [place], [place] [address]    Special Requests   Final    NONE Performed at [facility], [address], [place], [place] [address]    Gram Stain   Final    ABUNDANT WBC PRESENT, PREDOMINANTLY PMN RARE  SQUAMOUS EPITHELIAL CELLS PRESENT ABUNDANT GRAM POSITIVE COCCI IN PAIRS IN CLUSTERS FEW GRAM POSITIVE RODS FEW GRAM NEGATIVE RODS    Culture   Final    Consistent with normal respiratory flora. Performed at [facility], [place] [address] [place], [place] [address]    Report Status 08/21/2017 FINAL  Final  Culture, blood (Routine X 2) w Reflex to ID Panel     Status: None   Collection Time: 08/20/17  7:51 AM  Result Value Ref Range Status   Specimen Description BLOOD LEFT HAND  Final   Special Requests   Final    BOTTLES DRAWN AEROBIC AND ANAEROBIC Blood Culture adequate volume   Culture   Final    NO GROWTH 5 DAYS Performed at [facility], [place]  [address], [place], [place] [address]    Report Status 08/25/2017 FINAL  Final  Culture, blood (Routine X 2) w Reflex to ID Panel     Status: None   Collection Time: 08/20/17  7:52 AM  Result Value Ref Range Status   Specimen Description BLOOD LEFT WRIST  Final   Special Requests   Final    BOTTLES DRAWN AEROBIC AND ANAEROBIC Blood Culture adequate volume   Culture   Final    NO GROWTH 5 DAYS Performed at [facility], [address] [place], [place] [address]    Report Status 08/25/2017 FINAL  Final         Radiology Studies: No results found.      Scheduled Meds: . allopurinol  100 mg Per Tube Daily  . amantadine  100 mg Per Tube BID  . apixaban  2.5 mg Per Tube BID  . chlorhexidine gluconate (MEDLINE KIT)  15 mL Mouth Rinse BID  . collagenase  1 application Topical Daily  . famotidine  20 mg Per Tube BID  . ipratropium-albuterol  3 mL Nebulization Q6H  . mouth rinse  15 mL Mouth Rinse QID  . metoprolol tartrate  2.5 mg Intravenous Q6H  . omega-3 acid ethyl esters  1 g Per Tube Daily  . polyethylene glycol  17 g Oral Daily  . sertraline  50 mg Per Tube Daily  . tamsulosin  0.4 mg Oral Daily   Continuous Infusions: . 0.45 % NaCl with KCl 20 mEq / L 75 mL/hr at 08/26/17 1331  . feeding supplement (VITAL AF 1.2  CAL) 1,000 mL (08/26/17 0835)  . fluconazole (DIFLUCAN) IV Stopped (08/25/17 1958)  . meropenem (MERREM) IV Stopped (08/26/17 1438)  . vancomycin Stopped (08/26/17 1010)     LOS: 8 days    Time spent: 25 minutes.     Lelon Frohlich, MD [facility] Pager [phone]  If 7PM-7AM, please contact night-coverage [email or web address] Password [facility] [place] [facility] 08/26/2017, 4:37 PM

## 2017-08-27 DIAGNOSIS — E876 Hypokalemia: Secondary | ICD-10-CM | POA: Diagnosis not present

## 2017-08-27 DIAGNOSIS — I82A11 Acute embolism and thrombosis of right axillary vein: Secondary | ICD-10-CM | POA: Diagnosis not present

## 2017-08-27 DIAGNOSIS — E43 Unspecified severe protein-calorie malnutrition: Secondary | ICD-10-CM | POA: Diagnosis not present

## 2017-08-27 DIAGNOSIS — Z7189 Other specified counseling: Secondary | ICD-10-CM | POA: Diagnosis not present

## 2017-08-27 DIAGNOSIS — R740 Nonspecific elevation of levels of transaminase and lactic acid dehydrogenase [LDH]: Secondary | ICD-10-CM | POA: Diagnosis not present

## 2017-08-27 DIAGNOSIS — G20 Parkinson's disease: Secondary | ICD-10-CM | POA: Diagnosis present

## 2017-08-27 DIAGNOSIS — I10 Essential (primary) hypertension: Secondary | ICD-10-CM | POA: Diagnosis not present

## 2017-08-27 DIAGNOSIS — R0602 Shortness of breath: Secondary | ICD-10-CM | POA: Diagnosis not present

## 2017-08-27 DIAGNOSIS — L89153 Pressure ulcer of sacral region, stage 3: Secondary | ICD-10-CM | POA: Diagnosis not present

## 2017-08-27 DIAGNOSIS — Z430 Encounter for attention to tracheostomy: Secondary | ICD-10-CM | POA: Diagnosis not present

## 2017-08-27 DIAGNOSIS — I82621 Acute embolism and thrombosis of deep veins of right upper extremity: Secondary | ICD-10-CM | POA: Diagnosis not present

## 2017-08-27 DIAGNOSIS — E46 Unspecified protein-calorie malnutrition: Secondary | ICD-10-CM | POA: Diagnosis not present

## 2017-08-27 DIAGNOSIS — Z930 Tracheostomy status: Secondary | ICD-10-CM | POA: Diagnosis not present

## 2017-08-27 DIAGNOSIS — M6281 Muscle weakness (generalized): Secondary | ICD-10-CM | POA: Diagnosis not present

## 2017-08-27 DIAGNOSIS — L89122 Pressure ulcer of left upper back, stage 2: Secondary | ICD-10-CM | POA: Diagnosis present

## 2017-08-27 DIAGNOSIS — R41841 Cognitive communication deficit: Secondary | ICD-10-CM | POA: Diagnosis not present

## 2017-08-27 DIAGNOSIS — A419 Sepsis, unspecified organism: Secondary | ICD-10-CM | POA: Diagnosis not present

## 2017-08-27 DIAGNOSIS — E782 Mixed hyperlipidemia: Secondary | ICD-10-CM | POA: Diagnosis present

## 2017-08-27 DIAGNOSIS — Z9911 Dependence on respirator [ventilator] status: Secondary | ICD-10-CM | POA: Diagnosis not present

## 2017-08-27 DIAGNOSIS — R0902 Hypoxemia: Secondary | ICD-10-CM | POA: Diagnosis not present

## 2017-08-27 DIAGNOSIS — I609 Nontraumatic subarachnoid hemorrhage, unspecified: Secondary | ICD-10-CM | POA: Diagnosis not present

## 2017-08-27 DIAGNOSIS — M109 Gout, unspecified: Secondary | ICD-10-CM | POA: Diagnosis present

## 2017-08-27 DIAGNOSIS — R918 Other nonspecific abnormal finding of lung field: Secondary | ICD-10-CM | POA: Diagnosis not present

## 2017-08-27 DIAGNOSIS — Z7901 Long term (current) use of anticoagulants: Secondary | ICD-10-CM | POA: Diagnosis not present

## 2017-08-27 DIAGNOSIS — J690 Pneumonitis due to inhalation of food and vomit: Secondary | ICD-10-CM | POA: Diagnosis not present

## 2017-08-27 DIAGNOSIS — Z7401 Bed confinement status: Secondary | ICD-10-CM | POA: Diagnosis not present

## 2017-08-27 DIAGNOSIS — R491 Aphonia: Secondary | ICD-10-CM | POA: Diagnosis not present

## 2017-08-27 DIAGNOSIS — L89609 Pressure ulcer of unspecified heel, unspecified stage: Secondary | ICD-10-CM | POA: Diagnosis present

## 2017-08-27 DIAGNOSIS — J9601 Acute respiratory failure with hypoxia: Secondary | ICD-10-CM | POA: Diagnosis not present

## 2017-08-27 DIAGNOSIS — I679 Cerebrovascular disease, unspecified: Secondary | ICD-10-CM | POA: Diagnosis not present

## 2017-08-27 DIAGNOSIS — R1312 Dysphagia, oropharyngeal phase: Secondary | ICD-10-CM | POA: Diagnosis not present

## 2017-08-27 DIAGNOSIS — T790XXA Air embolism (traumatic), initial encounter: Secondary | ICD-10-CM | POA: Diagnosis not present

## 2017-08-27 DIAGNOSIS — G8250 Quadriplegia, unspecified: Secondary | ICD-10-CM | POA: Diagnosis not present

## 2017-08-27 DIAGNOSIS — Z515 Encounter for palliative care: Secondary | ICD-10-CM | POA: Diagnosis not present

## 2017-08-27 DIAGNOSIS — E785 Hyperlipidemia, unspecified: Secondary | ICD-10-CM | POA: Diagnosis not present

## 2017-08-27 DIAGNOSIS — L97919 Non-pressure chronic ulcer of unspecified part of right lower leg with unspecified severity: Secondary | ICD-10-CM | POA: Diagnosis not present

## 2017-08-27 DIAGNOSIS — J181 Lobar pneumonia, unspecified organism: Secondary | ICD-10-CM | POA: Diagnosis not present

## 2017-08-27 DIAGNOSIS — I2510 Atherosclerotic heart disease of native coronary artery without angina pectoris: Secondary | ICD-10-CM | POA: Diagnosis present

## 2017-08-27 DIAGNOSIS — R0603 Acute respiratory distress: Secondary | ICD-10-CM | POA: Diagnosis not present

## 2017-08-27 DIAGNOSIS — Z6822 Body mass index (BMI) 22.0-22.9, adult: Secondary | ICD-10-CM | POA: Diagnosis not present

## 2017-08-27 DIAGNOSIS — Y95 Nosocomial condition: Secondary | ICD-10-CM | POA: Diagnosis present

## 2017-08-27 DIAGNOSIS — Z8673 Personal history of transient ischemic attack (TIA), and cerebral infarction without residual deficits: Secondary | ICD-10-CM | POA: Diagnosis not present

## 2017-08-27 DIAGNOSIS — F339 Major depressive disorder, recurrent, unspecified: Secondary | ICD-10-CM | POA: Diagnosis not present

## 2017-08-27 DIAGNOSIS — F329 Major depressive disorder, single episode, unspecified: Secondary | ICD-10-CM | POA: Diagnosis present

## 2017-08-27 DIAGNOSIS — Z86718 Personal history of other venous thrombosis and embolism: Secondary | ICD-10-CM | POA: Diagnosis not present

## 2017-08-27 DIAGNOSIS — E870 Hyperosmolality and hypernatremia: Secondary | ICD-10-CM | POA: Diagnosis not present

## 2017-08-27 DIAGNOSIS — J189 Pneumonia, unspecified organism: Secondary | ICD-10-CM | POA: Diagnosis not present

## 2017-08-27 DIAGNOSIS — J1520 Pneumonia due to staphylococcus, unspecified: Secondary | ICD-10-CM | POA: Diagnosis not present

## 2017-08-27 DIAGNOSIS — L89150 Pressure ulcer of sacral region, unstageable: Secondary | ICD-10-CM | POA: Diagnosis not present

## 2017-08-27 DIAGNOSIS — N400 Enlarged prostate without lower urinary tract symptoms: Secondary | ICD-10-CM | POA: Diagnosis present

## 2017-08-27 DIAGNOSIS — Z87891 Personal history of nicotine dependence: Secondary | ICD-10-CM | POA: Diagnosis not present

## 2017-08-27 DIAGNOSIS — R279 Unspecified lack of coordination: Secondary | ICD-10-CM | POA: Diagnosis not present

## 2017-08-27 DIAGNOSIS — J9621 Acute and chronic respiratory failure with hypoxia: Secondary | ICD-10-CM | POA: Diagnosis not present

## 2017-08-27 DIAGNOSIS — R4182 Altered mental status, unspecified: Secondary | ICD-10-CM | POA: Diagnosis not present

## 2017-08-27 DIAGNOSIS — Z431 Encounter for attention to gastrostomy: Secondary | ICD-10-CM | POA: Diagnosis not present

## 2017-08-27 LAB — BASIC METABOLIC PANEL WITH GFR
Anion gap: 9 (ref 5–15)
BUN: 15 mg/dL (ref 6–20)
CO2: 26 mmol/L (ref 22–32)
Calcium: 8.8 mg/dL — ABNORMAL LOW (ref 8.9–10.3)
Chloride: 100 mmol/L — ABNORMAL LOW (ref 101–111)
Creatinine, Ser: <0.3 mg/dL — ABNORMAL LOW (ref 0.61–1.24)
Glucose, Bld: 115 mg/dL — ABNORMAL HIGH (ref 65–99)
Potassium: 3.6 mmol/L (ref 3.5–5.1)
Sodium: 135 mmol/L (ref 135–145)

## 2017-08-27 MED ORDER — IPRATROPIUM-ALBUTEROL 0.5-2.5 (3) MG/3ML IN SOLN
3.0000 mL | Freq: Four times a day (QID) | RESPIRATORY_TRACT | Status: DC
  Administered 2017-08-27: 3 mL via RESPIRATORY_TRACT
  Filled 2017-08-27: qty 3

## 2017-08-27 NOTE — Progress Notes (Signed)
Patient discharged back to [facility] home. [facility] for transport and called report to [facility] [phone] and gave report. Wife took all belongings including pass mur valve and extra obturator with her. [facility] transported patient with all paperwork. Transport did ask for social security number which I could not give. Did clean up patient before trasport.

## 2017-08-27 NOTE — Progress Notes (Signed)
Decreasing patient to while awake nebs. He is doing very well with his respiratory status.

## 2017-08-27 NOTE — Progress Notes (Signed)
Subjective: He is overall about the same.  He has now finished antibiotics for aspiration pneumonia and sepsis with coag negative staph bacteremia that is methicillin-resistant.  He follows with his eyes.  No overt aspiration Objective: Vital signs in last 24 hours: Temp:  [98.1 F (36.7 C)-98.9 F (37.2 C)] 98.6 F (37 C) (02/11 0400) Pulse Rate:  [79-87] 82 (02/11 0600) Resp:  [25-30] 27 (02/11 0600) BP: (115-125)/(61-71) 117/61 (02/11 0600) SpO2:  [92 %-98 %] 97 % (02/11 0600) FiO2 (%):  [28 %-32 %] 32 % (02/11 0219) Weight:  [84.4 kg (186 lb 1.1 oz)] 84.4 kg (186 lb 1.1 oz) (02/11 0500) Weight change: 0.1 kg (3.5 oz) Last BM Date: 08/25/16  Intake/Output from previous day: 02/10 0701 - 02/11 0700 In: 3579 [I.V.:1350; NG/GT:1778; IV Piggyback:250] Out: 4650 [Urine:4650]  PHYSICAL EXAM General appearance: He is awake and does mention follows with his eyes Resp: rhonchi bilaterally Cardio: regular rate and rhythm, S1, S2 normal, no murmur, click, rub or gallop GI: soft, non-tender; bowel sounds normal; no masses,  no organomegaly Extremities: extremities normal, atraumatic, no cyanosis or edema He has decubitus on his sacrum  Lab Results:  Results for orders placed or performed during the hospital encounter of 08/18/17 (from the past 48 hour(s))  Triglycerides     Status: None   Collection Time: 08/25/17  2:13 PM  Result Value Ref Range   Triglycerides 104 <150 mg/dL    Comment: Performed at [facility], [address], [place], [place] [address]  Basic metabolic panel     Status: Abnormal   Collection Time: 08/27/17  4:33 AM  Result Value Ref Range   Sodium 135 135 - 145 mmol/Mcpherson   Potassium 3.6 3.5 - 5.1 mmol/Mcpherson   Chloride 100 (Mcpherson) 101 - 111 mmol/Mcpherson   CO2 26 22 - 32 mmol/Mcpherson   Glucose, Bld 115 (H) 65 - 99 mg/dL   BUN 15 6 - 20 mg/dL   Creatinine, Ser <0.30 (Mcpherson) 0.61 - 1.24 mg/dL   Calcium 8.8 (Mcpherson) 8.9 - 10.3 mg/dL   GFR calc non Af Amer NOT CALCULATED >60 mL/min   GFR calc  Af Amer NOT CALCULATED >60 mL/min    Comment: (NOTE) The eGFR has been calculated using the CKD EPI equation. This calculation has not been validated in all clinical situations. eGFR's persistently <60 mL/min signify possible Chronic Kidney Disease.    Anion gap 9 5 - 15    Comment: Performed at [facility], [address], [place], [place] [address]    ABGS No results for input(s): PHART, PO2ART, TCO2, HCO3 in the last 72 hours.  Invalid input(s): PCO2 CULTURES Recent Results (from the past 240 hour(s))  Culture, blood (Routine x 2)     Status: Abnormal   Collection Time: 08/18/17  9:42 PM  Result Value Ref Range Status   Specimen Description   Final    LEFT ANTECUBITAL Performed at [facility], [address], [place], [place] [address]    Special Requests   Final    BOTTLES DRAWN AEROBIC AND ANAEROBIC Blood Culture adequate volume Performed at [facility], [address], [place], [place] [address]    Culture  Setup Time   Final    GRAM POSITIVE COCCI Gram Stain Report Called to,Read Back By and Verified With: GAMMONS,S. AT 0203 ON 08/20/2017 BY EVA Performed at [facility]    Culture (A)  Final    STAPHYLOCOCCUS SPECIES (COAGULASE NEGATIVE) SUSCEPTIBILITIES PERFORMED ON PREVIOUS CULTURE WITHIN  THE LAST 5 DAYS. Performed at [facility], [place] [address] [place], [place] [address]    Report Status 08/22/2017 FINAL  Final  Blood Culture ID Panel (Reflexed)     Status: Abnormal   Collection Time: 08/18/17  9:42 PM  Result Value Ref Range Status   Enterococcus species NOT DETECTED NOT DETECTED Final   Listeria monocytogenes NOT DETECTED NOT DETECTED Final   Staphylococcus species DETECTED (A) NOT DETECTED Final    Comment: Methicillin (oxacillin) resistant coagulase negative staphylococcus. Possible blood culture contaminant (unless isolated from more than one blood culture draw or clinical case suggests pathogenicity). No antibiotic treatment is indicated for  blood  culture contaminants. Mcpherson. Seay Pharm.D. 7:45 08/20/17 (wilsonm)    Staphylococcus aureus NOT DETECTED NOT DETECTED Final   Methicillin resistance DETECTED (A) NOT DETECTED Final    Comment: CRITICAL RESULT CALLED TO, READ BACK BY AND VERIFIED WITH: Mcpherson. Seay Pharm.D. 7:45 08/20/17 (wilsonm)    Streptococcus species NOT DETECTED NOT DETECTED Final   Streptococcus agalactiae NOT DETECTED NOT DETECTED Final   Streptococcus pneumoniae NOT DETECTED NOT DETECTED Final   Streptococcus pyogenes NOT DETECTED NOT DETECTED Final   Acinetobacter baumannii NOT DETECTED NOT DETECTED Final   Enterobacteriaceae species NOT DETECTED NOT DETECTED Final   Enterobacter cloacae complex NOT DETECTED NOT DETECTED Final   Escherichia coli NOT DETECTED NOT DETECTED Final   Klebsiella oxytoca NOT DETECTED NOT DETECTED Final   Klebsiella pneumoniae NOT DETECTED NOT DETECTED Final   Proteus species NOT DETECTED NOT DETECTED Final   Serratia marcescens NOT DETECTED NOT DETECTED Final   Haemophilus influenzae NOT DETECTED NOT DETECTED Final   Neisseria meningitidis NOT DETECTED NOT DETECTED Final   Pseudomonas aeruginosa NOT DETECTED NOT DETECTED Final   Candida albicans NOT DETECTED NOT DETECTED Final   Candida glabrata NOT DETECTED NOT DETECTED Final   Candida krusei NOT DETECTED NOT DETECTED Final   Candida parapsilosis NOT DETECTED NOT DETECTED Final   Candida tropicalis NOT DETECTED NOT DETECTED Final    Comment: Performed at [facility], [place] [address] [place], [place] [address]  Urine culture     Status: None   Collection Time: 08/18/17  9:46 PM  Result Value Ref Range Status   Specimen Description   Final    URINE, CLEAN CATCH Performed at [facility], [address] [place], [place] [address]    Special Requests   Final    NONE Performed at [facility], [address] [place], [place] [address]    Culture   Final    NO GROWTH Performed at [facility], [place] [address]  [place], [place] [address]    Report Status 08/20/2017 FINAL  Final  Culture, blood (Routine x 2)     Status: Abnormal   Collection Time: 08/18/17  9:51 PM  Result Value Ref Range Status   Specimen Description   Final    BLOOD LEFT FOREARM Performed at [facility], [address] [place], [place] [address]    Special Requests   Final    BOTTLES DRAWN AEROBIC ONLY Blood Culture adequate volume Performed at [facility], [address] [place], [place] [address]    Culture  Setup Time   Final    GRAM POSITIVE COCCI Gram Stain Report Called to,Read Back By and Verified With: HOWARD,C. AT 2138 ON 08/19/2017 BY EVA AEROBIC BOTTLE ONLY Performed at [place] (COAGULASE NEGATIVE) (A)  Final  Report Status 08/22/2017 FINAL  Final   Organism ID, Bacteria STAPHYLOCOCCUS SPECIES (COAGULASE NEGATIVE)  Final      Susceptibility   Staphylococcus species (coagulase negative) - MIC*    CIPROFLOXACIN >=8 RESISTANT Resistant     ERYTHROMYCIN >=8 RESISTANT Resistant     GENTAMICIN >=16 RESISTANT Resistant     OXACILLIN >=4 RESISTANT Resistant     TETRACYCLINE 2 SENSITIVE Sensitive     VANCOMYCIN 1 SENSITIVE Sensitive     TRIMETH/SULFA 80 RESISTANT Resistant     CLINDAMYCIN >=8 RESISTANT Resistant     RIFAMPIN <=0.5 SENSITIVE Sensitive     Inducible Clindamycin NEGATIVE Sensitive     * STAPHYLOCOCCUS SPECIES (COAGULASE NEGATIVE)  Blood Culture ID Panel (Reflexed)     Status: Abnormal   Collection Time: 08/18/17  9:51 PM  Result Value Ref Range Status   Enterococcus species NOT DETECTED NOT DETECTED Final   Listeria monocytogenes NOT DETECTED NOT DETECTED Final   Staphylococcus species DETECTED (A) NOT DETECTED Final    Comment: Methicillin (oxacillin) resistant coagulase negative staphylococcus. Possible blood culture contaminant (unless isolated from more than one blood culture draw or clinical case suggests pathogenicity). No antibiotic treatment is  indicated for blood  culture contaminants. CRITICAL RESULT CALLED TO, READ BACK BY AND VERIFIED WITH: TO SGANNONS(RN) BY TCLEVELAND 08/20/17 AT 5:44AM    Staphylococcus aureus NOT DETECTED NOT DETECTED Final   Methicillin resistance DETECTED (A) NOT DETECTED Final    Comment: CRITICAL RESULT CALLED TO, READ BACK BY AND VERIFIED WITH: TO SGANNONS(RN) BY TCLEVELAND 08/20/17 AT 5:44AM    Streptococcus species NOT DETECTED NOT DETECTED Final   Streptococcus agalactiae NOT DETECTED NOT DETECTED Final   Streptococcus pneumoniae NOT DETECTED NOT DETECTED Final   Streptococcus pyogenes NOT DETECTED NOT DETECTED Final   Acinetobacter baumannii NOT DETECTED NOT DETECTED Final   Enterobacteriaceae species NOT DETECTED NOT DETECTED Final   Enterobacter cloacae complex NOT DETECTED NOT DETECTED Final   Escherichia coli NOT DETECTED NOT DETECTED Final   Klebsiella oxytoca NOT DETECTED NOT DETECTED Final   Klebsiella pneumoniae NOT DETECTED NOT DETECTED Final   Proteus species NOT DETECTED NOT DETECTED Final   Serratia marcescens NOT DETECTED NOT DETECTED Final   Haemophilus influenzae NOT DETECTED NOT DETECTED Final   Neisseria meningitidis NOT DETECTED NOT DETECTED Final   Pseudomonas aeruginosa NOT DETECTED NOT DETECTED Final   Candida albicans NOT DETECTED NOT DETECTED Final   Candida glabrata NOT DETECTED NOT DETECTED Final   Candida krusei NOT DETECTED NOT DETECTED Final   Candida parapsilosis NOT DETECTED NOT DETECTED Final   Candida tropicalis NOT DETECTED NOT DETECTED Final    Comment: Performed at [facility], [place]. [address] [place], [place] [address]  MRSA PCR Screening     Status: None   Collection Time: 08/19/17  1:09 AM  Result Value Ref Range Status   MRSA by PCR NEGATIVE NEGATIVE Final    Comment:        The GeneXpert MRSA Assay (FDA approved for NASAL specimens only), is one component of a comprehensive MRSA colonization surveillance program. It is not intended to  diagnose MRSA infection nor to guide or monitor treatment for MRSA infections. Performed at [facility], [address], [place], [place] [address]   Respiratory Panel by PCR     Status: None   Collection Time: 08/19/17  1:55 AM  Result Value Ref Range Status   Adenovirus NOT DETECTED NOT DETECTED Final   Coronavirus 229E NOT DETECTED NOT  DETECTED Final   Coronavirus HKU1 NOT DETECTED NOT DETECTED Final   Coronavirus NL63 NOT DETECTED NOT DETECTED Final   Coronavirus OC43 NOT DETECTED NOT DETECTED Final   Metapneumovirus NOT DETECTED NOT DETECTED Final   Rhinovirus / Enterovirus NOT DETECTED NOT DETECTED Final   Influenza A NOT DETECTED NOT DETECTED Final   Influenza B NOT DETECTED NOT DETECTED Final   Parainfluenza Virus 1 NOT DETECTED NOT DETECTED Final   Parainfluenza Virus 2 NOT DETECTED NOT DETECTED Final   Parainfluenza Virus 3 NOT DETECTED NOT DETECTED Final   Parainfluenza Virus 4 NOT DETECTED NOT DETECTED Final   Respiratory Syncytial Virus NOT DETECTED NOT DETECTED Final   Bordetella pertussis NOT DETECTED NOT DETECTED Final   Chlamydophila pneumoniae NOT DETECTED NOT DETECTED Final   Mycoplasma pneumoniae NOT DETECTED NOT DETECTED Final    Comment: Performed at [facility], [place] [address] [place], [place] [address]  Culture, respiratory (NON-Expectorated)     Status: None   Collection Time: 08/19/17  2:40 AM  Result Value Ref Range Status   Specimen Description   Final    TRACHEAL ASPIRATE Performed at [facility], [address] [place], [place] [address]    Special Requests   Final    NONE Performed at [facility], [address] [place], [place] [address]    Gram Stain   Final    ABUNDANT WBC PRESENT, PREDOMINANTLY PMN RARE SQUAMOUS EPITHELIAL CELLS PRESENT ABUNDANT GRAM POSITIVE COCCI IN PAIRS IN CLUSTERS FEW GRAM POSITIVE RODS FEW GRAM NEGATIVE RODS    Culture   Final    Consistent with normal respiratory flora. Performed at [place], [place] [address] [place], [place] [address]    Report Status 08/21/2017 FINAL  Final  Culture, blood (Routine X 2) w Reflex to ID Panel     Status: None   Collection Time: 08/20/17  7:51 AM  Result Value Ref Range Status   Specimen Description BLOOD LEFT HAND  Final   Special Requests   Final    BOTTLES DRAWN AEROBIC AND ANAEROBIC Blood Culture adequate volume   Culture   Final    NO GROWTH 5 DAYS Performed at [facility], [address], [place], [place] [address]    Report Status 08/25/2017 FINAL  Final  Culture, blood (Routine X 2) w Reflex to ID Panel     Status: None   Collection Time: 08/20/17  7:52 AM  Result Value Ref Range Status   Specimen Description BLOOD LEFT WRIST  Final   Special Requests   Final    BOTTLES DRAWN AEROBIC AND ANAEROBIC Blood Culture adequate volume   Culture   Final    NO GROWTH 5 DAYS Performed at [facility], [address], [place],  [address]    Report Status 08/25/2017 FINAL  Final   Studies/Results: No results found.  Medications:  Prior to Admission:  Medications Prior to Admission  Medication Sig Dispense Refill Last Dose  . allopurinol (ZYLOPRIM) 100 MG tablet Place 100 mg into feeding tube 3 (three) times a week. Every Tuesday, Thursday, Sunday   08/16/2017 at Unknown time  . amantadine (SYMMETREL) 100 MG capsule Place 100 mg into feeding tube 2 (two) times daily.   08/18/2017 at Unknown time  . apixaban (ELIQUIS) 2.5 MG TABS tablet Place 2.5 mg into feeding tube 2 (two) times daily.   08/18/2017 at 1700  . collagenase (SANTYL) ointment Apply 1 application topically daily. Right buttocks area  08/18/2017 at Unknown time  . famotidine (PEPCID) 20 MG tablet Place 20 mg into feeding tube 2 (two) times daily.   08/18/2017 at Unknown time  . HYDROcodone-acetaminophen (NORCO/VICODIN) 5-325 MG tablet Place 1 tablet into feeding tube every 6 (six) hours as needed for moderate pain.   unknown  . Melatonin 3 MG TABS Place 3 mg into feeding tube at  bedtime.   08/18/2017 at Unknown time  . Metoprolol Tartrate 37.5 MG TABS Place 1 tablet into feeding tube daily.   08/18/2017 at 800a  . modafinil (PROVIGIL) 100 MG tablet Place 100 mg into feeding tube daily.   08/18/2017 at Unknown time  . Omega-3 Fatty Acids (FISH OIL) 1000 MG CAPS Place 1 capsule into feeding tube daily.   08/18/2017 at Unknown time  . polyethylene glycol powder (GLYCOLAX/MIRALAX) powder Take 17 g by mouth daily.   08/18/2017 at Unknown time  . sertraline (ZOLOFT) 50 MG tablet Place 50 mg into feeding tube daily.   08/18/2017 at Unknown time  . tamsulosin (FLOMAX) 0.4 MG CAPS capsule 0.4 mg daily. Per G-Tube   08/18/2017 at Unknown time   Scheduled: . allopurinol  100 mg Per Tube Daily  . amantadine  100 mg Per Tube BID  . apixaban  2.5 mg Per Tube BID  . chlorhexidine gluconate (MEDLINE KIT)  15 mL Mouth Rinse BID  . collagenase  1 application Topical Daily  . famotidine  20 mg Per Tube BID  . ipratropium-albuterol  3 mL Nebulization Q6H WA  . mouth rinse  15 mL Mouth Rinse QID  . metoprolol tartrate  2.5 mg Intravenous Q6H  . omega-3 acid ethyl esters  1 g Per Tube Daily  . polyethylene glycol  17 g Oral Daily  . sertraline  50 mg Per Tube Daily  . tamsulosin  0.4 mg Oral Daily   Continuous: . 0.45 % NaCl with KCl 20 mEq / Mcpherson 75 mL/hr at 08/26/17 1331  . feeding supplement (VITAL AF 1.2 CAL) 1,000 mL (08/26/17 0835)  . fluconazole (DIFLUCAN) IV Stopped (08/26/17 1850)   IWO:EHOZYYQMGNOIB **OR** acetaminophen, midazolam, ondansetron **OR** ondansetron (ZOFRAN) IV  Assesment: He was admitted with sepsis from aspiration pneumonia and possibly related to his sacral decubitus ulcer.  He had positive blood cultures for methicillin-resistant coag-negative staph.  He finished antibiotics yesterday.  He has permanent tracheostomy and that was switched to a cuffed trach 8 days ago so that he could be ventilated.  He had acute on chronic hypoxic respiratory failure and required  ventilator support for about 3 days but has been on trach collar since.  He has history of stroke which was then complicated by subarachnoid hemorrhage and has altered mental status from that.  He has malnutrition and has a feeding tube in place.  He remains high risk for aspiration Principal Problem:   Sepsis ([place]) Active Problems:   Hyperlipidemia   Essential hypertension   CAD, NATIVE VESSEL   Stroke (cerebrum) (HCC)   SAH (subarachnoid hemorrhage) (HCC)   Pneumonia   Acute hypoxemic respiratory failure (HCC)   Acute on chronic respiratory failure with hypoxia (HCC)   Sepsis due to undetermined organism ([place])   Aspiration pneumonia of both lower lobes due to gastric secretions (HCC)   Tracheostomy status ([place])   Deep vein thrombosis (DVT) of right upper extremity ([place])   Transaminasemia   Lobar pneumonia ([place])   Sacral decubitus ulcer, stage III ([place])   Staphylococcal sepsis ([place])   Healthcare-associated pneumonia   Malnutrition  of moderate degree   Palliative care encounter   Acute respiratory failure with hypoxemia ([place])    Plan: With plans being made for him to go back to skilled care facility soon he will need to have his tracheostomy converted to a non-cuffed tracheostomy    LOS: 9 days   Richard Mcpherson 08/27/2017, 7:18 AM

## 2017-08-27 NOTE — Care Management Important Message (Signed)
Important Message  Patient Details  Name: Richard Mcpherson MRN: [id number] Date of Birth: [birth year]   Medicare Important Message Given:  Yes    Maximilliano Kersh, Chrystine Oiler, RN 08/27/2017, 1:12 PM

## 2017-08-27 NOTE — Progress Notes (Signed)
Trach changed to a 4cfls per Dr Juanetta Gosling verbal order. There were no complications while doing the procedure. Positive color change on the ECO2 detector. Bilateral breath sounds noted. Patient had quite a bit of coughing. Large amount of thick whitish/clear secretions noted. PMV placed back on patient after new trach placement. Patient is in no known distress at this time.

## 2017-08-27 NOTE — Clinical Social Work Note (Signed)
LCSW following. Notified by MD that pt stable for dc today. Updated Tammy at [facility] and she came to see pt and speak with his wife. They can accept pt back today. Sent DC paperwork to [employer] via [employer]. Provided envelope and transport form to pt's RN who will call report and then call [facility] to request transport.   There are no other CSW needs for dc.

## 2017-08-27 NOTE — NC FL2 (Signed)
[place] LEVEL OF CARE SCREENING TOOL     IDENTIFICATION  Patient Name: Richard Mcpherson Birthdate: Apr 10, 1941 Sex: male Admission Date (Current Location): 08/18/2017  [facility] and [place] Number:  [employer] and Address:  [address] [place]      Provider Number: [id number]  Attending Physician Name and Address:  Isaac Bliss, Olam Idler*  Relative Name and Phone Number:  Bransen Fassnacht (wife) [phone]    Current Level of Care: Hospital Recommended Level of Care: [place] Prior Approval Number: [id number]  Date Approved/Denied: 02/14/17 PASRR Number:    Discharge Plan: SNF    Current Diagnoses: Patient Active Problem List   Diagnosis Date Noted  . Acute respiratory failure with hypoxemia ([place])   . Palliative care encounter   . Malnutrition of moderate degree 08/21/2017  . Sacral decubitus ulcer, stage III ([place]) 08/20/2017  . Staphylococcal sepsis ([place]) 08/20/2017  . Healthcare-associated pneumonia   . Acute on chronic respiratory failure with hypoxia ([place]) 08/19/2017  . Sepsis due to undetermined organism ([place]) 08/19/2017  . Aspiration pneumonia of both lower lobes due to gastric secretions ([place]) 08/19/2017  . Tracheostomy status ([place]) 08/19/2017  . Deep vein thrombosis (DVT) of right upper extremity ([place]) 08/19/2017  . Lobar pneumonia ([place]) 08/19/2017  . Transaminasemia   . Sepsis ([place]) 08/18/2017  . Pneumonia 08/18/2017  . Acute hypoxemic respiratory failure ([place]) 08/18/2017  . 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 Stroke (cerebrum) ([place]) 04/18/2017  . SAH (subarachnoid hemorrhage) ([place]) 04/18/2017  . 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 Hyperlipidemia 03/25/2009  . Essential hypertension 03/25/2009  . CAD, NATIVE VESSEL 03/25/2009  . PALPITATIONS 03/25/2009    Orientation RESPIRATION BLADDER Height &  Weight     Self  Tracheostomy, O2 Incontinent Weight: 186 lb 1.1 oz (84.4 kg) Height:  6' (182.9 cm)  BEHAVIORAL SYMPTOMS/MOOD NEUROLOGICAL BOWEL NUTRITION STATUS      Continent Diet(NPO)  AMBULATORY STATUS COMMUNICATION OF NEEDS Skin   Total Care Verbally Normal                       Personal Care Assistance Level of Assistance  Total care       Total Care Assistance: Maximum assistance   Functional Limitations Info  Sight, Hearing, Speech Sight Info: Adequate Hearing Info: Adequate Speech Info: Impaired    SPECIAL CARE FACTORS FREQUENCY                       Contractures Contractures Info: Not present    Additional Factors Info  Code Status, Allergies Code Status Info: full Allergies Info: codeine, crestor, lipitor, Statins, Allopurinol           Current Medications (08/27/2017):  This is the current hospital active medication list Current Facility-Administered Medications  Medication Dose Route Frequency Provider Last Rate Last Dose  . 0.45 % NaCl with KCl 20 mEq / L infusion   Intravenous Continuous Orson Eva, MD 75 mL/hr at 08/27/17 7948    . acetaminophen (TYLENOL) tablet 650 mg  650 mg Oral Q6H PRN Heath Lark D, DO   650 mg at 08/21/17 1930   Or  . acetaminophen (TYLENOL) suppository 650 mg  650 mg Rectal Q6H PRN Heath Lark D, DO   650 mg at 08/19/17 2036  . allopurinol (ZYLOPRIM) tablet 100 mg  100 mg Per Tube Daily Manuella Ghazi, Pratik D,  DO   100 mg at 08/27/17 1008  . amantadine (SYMMETREL) capsule 100 mg  100 mg Per Tube BID Manuella Ghazi, Pratik D, DO   100 mg at 08/27/17 1008  . apixaban (ELIQUIS) tablet 2.5 mg  2.5 mg Per Tube BID Manuella Ghazi, Pratik D, DO   2.5 mg at 08/27/17 1008  . chlorhexidine gluconate (MEDLINE KIT) (PERIDEX) 0.12 % solution 15 mL  15 mL Mouth Rinse BID Tat, [place], MD   15 mL at 08/27/17 0756  . collagenase (SANTYL) ointment 1 application  1 application Topical Daily Tat, Iktan, MD   1 application at 88/89/16 1047  . famotidine (PEPCID)  tablet 20 mg  20 mg Per Tube BID Manuella Ghazi, Pratik D, DO   20 mg at 08/27/17 1009  . feeding supplement (VITAL AF 1.2 CAL) liquid 1,000 mL  1,000 mL Per Tube Continuous Erline Hau, MD 65 mL/hr at 08/26/17 0835 1,000 mL at 08/26/17 0835  . fluconazole (DIFLUCAN) IVPB 100 mg  100 mg Intravenous Q24H Isaac Bliss, Rayford Halsted, MD   Stopped at 08/26/17 1850  . ipratropium-albuterol (DUONEB) 0.5-2.5 (3) MG/3ML nebulizer solution 3 mL  3 mL Nebulization Q6H WA Isaac Bliss, Rayford Halsted, MD   3 mL at 08/27/17 0827  . MEDLINE mouth rinse  15 mL Mouth Rinse QID Tat, Karma, MD   15 mL at 08/27/17 0400  . metoprolol tartrate (LOPRESSOR) injection 2.5 mg  2.5 mg Intravenous Q6H Orson Eva, MD   2.5 mg at 08/27/17 1009  . midazolam (VERSED) injection 1 mg  1 mg Intravenous Q2H PRN Sinda Du, MD   1 mg at 08/19/17 1024  . omega-3 acid ethyl esters (LOVAZA) capsule 1 g  1 g Per Tube Daily Shah, Pratik D, DO   1 g at 08/27/17 1009  . ondansetron (ZOFRAN) tablet 4 mg  4 mg Oral Q6H PRN Manuella Ghazi, Pratik D, DO       Or  . ondansetron (ZOFRAN) injection 4 mg  4 mg Intravenous Q6H PRN Manuella Ghazi, Pratik D, DO      . polyethylene glycol (MIRALAX / GLYCOLAX) packet 17 g  17 g Oral Daily Tat, Dailen, MD   17 g at 08/27/17 1009  . sertraline (ZOLOFT) tablet 50 mg  50 mg Per Tube Daily Manuella Ghazi, Pratik D, DO   50 mg at 08/27/17 1009  . tamsulosin (FLOMAX) capsule 0.4 mg  0.4 mg Oral Daily Manuella Ghazi, Pratik D, DO   0.4 mg at 08/27/17 1009     Discharge Medications: Please see discharge summary for a list of discharge medications.  Relevant Imaging Results:  Relevant Lab Results:   Additional Information SSN: [id number] [place]

## 2017-08-27 NOTE — Discharge Summary (Signed)
Physician Discharge Summary  Richard Mcpherson ZOX:[id number] DOB: [birth year] DOA: 08/18/2017  PCP: Richard Peri, MD  Admit date: 08/18/2017 Discharge date: 08/27/2017  Time spent: 45 minutes  Recommendations for Outpatient Follow-up:  -Will be discharged back to skilled nursing facility today in stable condition.  Discharge Diagnoses:  Principal Problem:   Sepsis (HCC) Active Problems:   Hyperlipidemia   Essential hypertension   CAD, NATIVE VESSEL   Stroke (cerebrum) (HCC)   SAH (subarachnoid hemorrhage) (HCC)   Pneumonia   Acute hypoxemic respiratory failure (HCC)   Acute on chronic respiratory failure with hypoxia (HCC)   Sepsis due to undetermined organism (HCC)   Aspiration pneumonia of both lower lobes due to gastric secretions (HCC)   Tracheostomy status (HCC)   Deep vein thrombosis (DVT) of right upper extremity (HCC)   Transaminasemia   Lobar pneumonia (HCC)   Sacral decubitus ulcer, stage III (HCC)   Staphylococcal sepsis (HCC)   Healthcare-associated pneumonia   Malnutrition of moderate degree   Palliative care encounter   Acute respiratory failure with hypoxemia [name])   Discharge Condition: Stable, guarded  Filed Weights   08/25/17 0400 08/26/17 0500 08/27/17 0500  Weight: 85.4 kg (188 lb 4.4 oz) 84.3 kg (185 lb 13.6 oz) 84.4 kg (186 lb 1.1 oz)    History of present illness:  As per Dr. Sherryll Burger on 2/2:  Richard Mcpherson is a 77 y.o. male with medical history significant for prior CVA on Eliquis  with subsequent tracheostomy and PEG tube placement, hypertension, dyslipidemia, gout, and heel and sacral decubitus ulcers who was brought to the ED due to fever, tachypnea, and decreased oxygen saturation in the 70th percentile noted at his skilled nursing facility.  He is currently level 5 caveat and is otherwise nonverbal.   ED Course: Fluid bolus is currently underway and vital signs appear to be stable.  He is febrile with a temperature of 102F.  Laboratory data  demonstrates leukocytosis with count of 19,400 and lactic acid level is 1.88.  Chest x-ray demonstrates left lower lobe consolidation suggestive of pneumonia.  He is only able to open his eyes to verbal command and is currently on 15 L oxygen via nonrebreather over his tracheostomy with saturation in the low 90th percentile.  He has been started on vancomycin and Zosyn and cultures have been obtained.    Hospital Course:    Sepsis -Secondary to pneumonia and coag negative staph bacteremia. -Has completed treatment with vancomycin and meropenem. -Reviewed echo report from 08/20/17: EF 65-70%, no wall motion abnormalities, no vegetations. -Discussed case via phone with Dr. Orvan Falconer, infectious diseases, who recommends continuing antibiotics for total of 7-10 days.  Discussed with pharmacy as well. Last day is Sunday.  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 -Patient was started on mechanical ventilation on 2/3 due to respiratory distress and worsening hypoxemia, was able to be extubated on 2/6. -Dr. Juanetta Gosling following. -CT scan of the chest without PE but significant left lower lobe infiltrate and consolidation. -Has completed treatment course of vancomycin and meropenem.  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 -Per wife is more alert, she states he has been able to follow some commands by blinking once or twice for yes or no questions. Also some words with Passy-Muir valve.  Stage III sacral  decubitus ulcer -Wound care following.  Severe protein caloric malnutrition -Continue enteral feedings and supplements as per dietitian recommendations.  Oral thrush -Has completed a course of fluconazole.  Ginette Pitman is  improved.    Procedures:  None   Consultations:  Pulmonary, Dr. Juanetta Gosling  Discharge Instructions  Discharge Instructions    Diet - low sodium heart healthy   Complete by:  As directed    Increase activity slowly   Complete by:  As directed      Allergies as of 08/27/2017      Reactions   Codeine Nausea Only, Other (See Comments)   Crestor [rosuvastatin Calcium] Other (See Comments)   bodyache   Lipitor [atorvastatin] Other (See Comments)   Body aches   Statins    Allopurinol Rash      Medication List    STOP taking these medications   HYDROcodone-acetaminophen 5-325 MG tablet Commonly known as:  NORCO/VICODIN   Melatonin 3 MG Tabs   modafinil 100 MG tablet Commonly known as:  PROVIGIL     TAKE these medications   allopurinol 100 MG tablet Commonly known as:  ZYLOPRIM Place 100 mg into feeding tube 3 (three) times a week. Every Tuesday, Thursday, Sunday   amantadine 100 MG capsule Commonly known as:  SYMMETREL Place 100 mg into feeding tube 2 (two) times daily.   ELIQUIS 2.5 MG Tabs tablet Generic drug:  apixaban Place 2.5 mg into feeding tube 2 (two) times daily.   famotidine 20 MG tablet Commonly known as:  PEPCID Place 20 mg into feeding tube 2 (two) times daily.   Fish Oil 1000 MG Caps Place 1 capsule into feeding tube daily.   Metoprolol Tartrate 37.5 MG Tabs Place 1 tablet into feeding tube daily.   polyethylene glycol powder powder Commonly known as:  GLYCOLAX/MIRALAX Take 17 g by mouth daily.   SANTYL ointment Generic drug:  collagenase Apply 1 application topically daily. Right buttocks area   sertraline 50 MG tablet Commonly known as:  ZOLOFT Place 50 mg into feeding tube daily.   tamsulosin 0.4 MG Caps capsule Commonly known as:  FLOMAX 0.4 mg daily. Per G-Tube      Allergies  Allergen Reactions  . Codeine Nausea Only and Other (See Comments)  . Crestor [Rosuvastatin Calcium] Other (See Comments)    bodyache  . Lipitor  [Atorvastatin] Other (See Comments)    Body aches  . Statins   . Allopurinol Rash      The results of significant diagnostics from this hospitalization (including imaging, microbiology, ancillary and laboratory) are listed below for reference.    Significant Diagnostic Studies: Ct Abdomen Wo Contrast  Result Date: 08/03/2017 CLINICAL DATA:  Gastrostomy tube evaluation EXAM: CT ABDOMEN WITHOUT CONTRAST TECHNIQUE: Multidetector CT imaging of the abdomen was performed following the standard protocol without IV contrast. COMPARISON:  None. FINDINGS: Lower chest: Bibasilar dependent consolidation left greater than right. Hepatobiliary: Unremarkable Pancreas: Unremarkable Spleen: Unremarkable Adrenals/Urinary Tract: Vascular calcifications are noted. Renal parenchyma is within normal limits. Adrenal glands are within normal limits. Stomach/Bowel: NG tube is in place. The stomach is positioned between the liver and transverse colon. No obvious mass in the colon. No evidence of small-bowel obstruction. Vascular/Lymphatic: Atherosclerotic calcifications of the aorta and iliac arteries. Other: No free fluid. Musculoskeletal: No vertebral compression deformity. Degenerative changes in the hips and lumbar spine. Additional imaging through the pelvis: Bladder is unremarkable. Prostate is within normal limits. IMPRESSION: There is favorable anatomy for gastrostomy tube placement. Bibasilar pulmonary consolidation left  greater than right. Electronically Signed   By: Jolaine Click M.D.   On: 08/03/2017 08:15   Ct Chest Wo Contrast  Result Date: 08/22/2017 CLINICAL DATA:  Respiratory failure.  Former smoker. EXAM: CT CHEST WITHOUT CONTRAST TECHNIQUE: Multidetector CT imaging of the chest was performed following the standard protocol without IV contrast. COMPARISON:  None. FINDINGS: Cardiovascular: Heart size appears normal. Aortic atherosclerosis noted. Calcifications within the left circumflex, lad coronary arteries  noted. Mediastinum/Nodes: The trachea appears patent and midline. Tracheostomy tube tip is above the carina. Normal appearance of the esophagus. Subcarinal lymph node is enlarged measuring 1.4 cm. No axillary or supraclavicular adenopathy. Evaluation of the hilar nodes limited due to lack of IV contrast material. Lungs/Pleura: Trace left pleural effusion. Moderate change of emphysema. There is dense airspace consolidation with air bronchograms involving much of the left lower lobe. Mild subsegmental atelectasis and subpleural consolidation noted in the posterior right lower lobe. Scattered small ground-glass and tree-in-bud nodules identified within the left upper lobe. Calcified granuloma identified within the right upper lobe. Small noncalcified right middle lobe lung nodule measures 3 mm, image 91 of series 4. Upper Abdomen: No acute abnormality. Musculoskeletal: No chest wall mass or suspicious bone lesions identified. IMPRESSION: 1. There is dense airspace consolidation involving much of the left lower lobe. Findings are compatible with pneumonia and/or aspiration. Followup imaging following appropriate antibiotic therapy is advised to ensure resolution. In the absence of resolution there should be consideration for underlying pulmonary malignancy. 2. Small scattered ground-glass and tree-in-bud nodules in the left upper lobe are likely inflammatory in etiology. 3. Enlarged subcarinal lymph node may be reactive in the setting of pneumonia. Attention on follow-up imaging is advised. 4. Aortic Atherosclerosis (ICD10-I70.0) and Emphysema (ICD10-J43.9). Atherosclerotic calcifications in the LAD and left circumflex coronary arteries noted. 5. Right middle lobe lung nodule measures 3 mm. Non-contrast chest CT can be considered in 12 months if patient is high-risk. This recommendation follows the consensus statement: Guidelines for Management of Incidental Pulmonary Nodules Detected on CT Images: From the Fleischner  Society 2017; Radiology 2017; 284:228-243. Electronically Signed   By: Signa Kell M.D.   On: 08/22/2017 10:21   Ir Gastrostomy Tube Mod Sed  Result Date: 08/06/2017 INDICATION: Dysphagia EXAM: TWENTY FRENCH PULL-THROUGH GASTROSTOMY Date:  08/06/2017 08/06/2017 9:36 am Radiologist:  M. Ruel Favors, MD Guidance:  Ultrasound and fluoroscopic MEDICATIONS: 1 g Ancef; Antibiotics were administered within 1 hour of the procedure. Glucagon 0.5 mg IV ANESTHESIA/SEDATION: Versed 0.5 mg IV; Fentanyl 25 mcg IV Moderate Sedation Time:  15 The patient was continuously monitored during the procedure by the interventional radiology nurse under my direct supervision. CONTRAST:  10 cc Isovue-300-administered into the gastric lumen. FLUOROSCOPY TIME:  Fluoroscopy Time: 2 minutes 30 seconds (12 mGy). COMPLICATIONS: None immediate. PROCEDURE: Informed consent was obtained from the patient following explanation of the procedure, risks, benefits and alternatives. The patient understands, agrees and consents for the procedure. All questions were addressed. A time out was performed. Maximal barrier sterile technique utilized including caps, mask, sterile gowns, sterile gloves, large sterile drape, hand hygiene, and betadine prep. The left upper quadrant was sterilely prepped and draped. An oral gastric catheter was inserted into the stomach under fluoroscopy. The existing nasogastric feeding tube was removed. Air was injected into the stomach for insufflation and visualization under fluoroscopy. The air distended stomach was confirmed beneath the anterior abdominal wall in the frontal and lateral projections. Under sterile conditions and local anesthesia, a 17 gauge trocar needle was  utilized to access the stomach percutaneously beneath the left subcostal margin. Needle position was confirmed within the stomach under biplane fluoroscopy. Contrast injection confirmed position also. A single T tack was deployed for gastropexy. Over an  Amplatz guide wire, a 9-French sheath was inserted into the stomach. A snare device was utilized to capture the oral gastric catheter. The snare device was pulled retrograde from the stomach up the esophagus and out the oropharynx. The 20-French pull-through gastrostomy was connected to the snare device and pulled antegrade through the oropharynx down the esophagus into the stomach and then through the percutaneous tract external to the patient. The gastrostomy was assembled externally. Contrast injection confirms position in the stomach. Images were obtained for documentation. The patient tolerated procedure well. No immediate complication. IMPRESSION: Fluoroscopic insertion of a 20-French "pull-through" gastrostomy. Electronically Signed   By: Judie Petit.  Shick M.D.   On: 08/06/2017 09:52   US Venous Img Upper Uni Right  Result Date: 08/20/2017 CLINICAL DATA:  77 year old male with a history right upper extremity pain and edema EXAM: RIGHT UPPER EXTREMITY VENOUS DOPPLER ULTRASOUND TECHNIQUE: Gray-scale sonography with graded compression, as well as color Doppler and duplex ultrasound were performed to evaluate the upper extremity deep venous system from the level of the subclavian vein and including the jugular, axillary, basilic, radial, ulnar and upper cephalic vein. Spectral Doppler was utilized to evaluate flow at rest and with distal augmentation maneuvers. COMPARISON:  None. FINDINGS: Contralateral Subclavian Vein: Respiratory phasicity is normal and symmetric with the symptomatic side. No evidence of thrombus. Normal compressibility. Internal Jugular Vein: No evidence of thrombus. Normal compressibility, respiratory phasicity and response to augmentation. Subclavian Vein: No evidence of thrombus. Normal compressibility, respiratory phasicity and response to augmentation. Axillary Vein: No evidence of thrombus. Normal compressibility, respiratory phasicity and response to augmentation. Cephalic Vein: No evidence  of thrombus. Normal compressibility, respiratory phasicity and response to augmentation. Basilic Vein: No evidence of thrombus. Normal compressibility, respiratory phasicity and response to augmentation. Brachial Veins: No evidence of thrombus. Normal compressibility, respiratory phasicity and response to augmentation. Radial Veins: No evidence of thrombus. Normal compressibility, respiratory phasicity and response to augmentation. Ulnar Veins: No evidence of thrombus. Normal compressibility, respiratory phasicity and response to augmentation. Other Findings:  None visualized. IMPRESSION: Sonographic survey of the right upper extremity negative for DVT. Signed, Yvone Neu. Loreta Ave, DO [facility] Radiology Electronically Signed   By: Gilmer Mor D.O.   On: 08/20/2017 12:42   Dg Chest Port 1 View  Result Date: 08/21/2017 CLINICAL DATA:  Respiratory failure, CVA, sepsis EXAM: PORTABLE CHEST 1 VIEW COMPARISON:  Portable chest x-ray of August 20, 2017 FINDINGS: The lungs are adequately inflated. There is dense infiltrate in the left lower lobe. There remains obscuration of the left hemidiaphragm. The heart is normal in size. The pulmonary vascularity is not engorged. The endotracheal tube tip projects at the inferior margin of the clavicular heads. There calcification in the wall of the aortic arch. IMPRESSION: Left lower lobe atelectasis and/or pneumonia more conspicuous today. Small left pleural effusion. Thoracic aortic atherosclerosis. Electronically Signed   By: Javares  [place] M.D.   On: 08/21/2017 06:54   Dg Chest Port 1 View  Result Date: 08/20/2017 CLINICAL DATA:  Acute respiratory failure EXAM: PORTABLE CHEST 1 VIEW COMPARISON:  08/19/2017 FINDINGS: Cardiac shadow is stable. Aortic calcifications are again seen. Tracheostomy tube is again seen and stable. Lungs are well aerated with the exception of persistent left retrocardiac density. The overall appearance  however has improved when compare with the prior exam. IMPRESSION: Persistent but significantly improved left retrocardiac density. Electronically Signed   By: Alcide Clever M.D.   On: 08/20/2017 07:09   Dg Chest Port 1 View  Result Date: 08/19/2017 CLINICAL DATA:  Follow-up healthcare associated pneumonia, sepsis EXAM: PORTABLE CHEST 1 VIEW COMPARISON:  08/18/2017 FINDINGS: Moderate left pleural effusion, mildly increased. Associated left lower lobe opacity, favoring compressive atelectasis, pneumonia not excluded. Mild left suprahilar opacity, new, favoring asymmetric interstitial edema given the rapid change. Right lung is essentially clear. No pneumothorax. Tracheostomy in satisfactory position. The heart is top-normal in size. IMPRESSION: Mild left suprahilar opacity, new, favoring asymmetric interstitial edema given the rapid change. Moderate left pleural effusion, increased. Associated left lower lobe opacity, favoring compressive atelectasis, pneumonia not excluded. Electronically Signed   By: Charline Bills M.D.   On: 08/19/2017 07:14   Dg Chest Portable 1 View  Result Date: 08/18/2017 CLINICAL DATA:  Chronic ventilator dependent respiratory failure, presenting with fever, tachycardia, tachypnea and hypoxia. Personal history of stroke. EXAM: PORTABLE CHEST 1 VIEW COMPARISON:  07/22/2017, 11/23/2016 and earlier. FINDINGS: Tracheostomy tube tip in satisfactory position below the thoracic inlet. Cardiac silhouette upper normal in size for AP portable technique, unchanged. Dense airspace consolidation involving the left lower lobe. Lungs otherwise clear. 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 IMPRESSION: 1. Dense left lower lobe pneumonia and/or atelectasis. 2. 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 Electronically Signed   By: Hulan Saas M.D.   On: 08/18/2017 22:15   US Abdomen Limited Ruq  Result Date: 08/19/2017 CLINICAL DATA:  Elevated LFTs EXAM: ULTRASOUND  ABDOMEN LIMITED RIGHT UPPER QUADRANT COMPARISON:  CT abdomen/pelvis dated 08/02/2017 FINDINGS: Gallbladder: Layering gallbladder sludge. No gallbladder wall thickening or pericholecystic fluid. Common bile duct: Diameter: 3 mm Liver: No focal lesion identified. Within normal limits in parenchymal echogenicity. Portal vein is patent on color Doppler imaging with normal direction of blood flow towards the liver. IMPRESSION: Layering gallbladder sludge, without associated inflammatory changes. Electronically Signed   By: Charline Bills M.D.   On: 08/19/2017 11:27    Microbiology: Recent Results (from the past 240 hour(s))  Culture, blood (Routine x 2)     Status: Abnormal   Collection Time: 08/18/17  9:42 PM  Result Value Ref Range Status   Specimen Description   Final    LEFT ANTECUBITAL Performed at [facility], [address], [place], [place] [address]    Special Requests   Final    BOTTLES DRAWN AEROBIC AND ANAEROBIC Blood Culture adequate volume Performed at [facility], [address] [place], [place] [address]    Culture  Setup Time   Final    GRAM POSITIVE COCCI Gram Stain Report Called to,Read Back By and Verified With: GAMMONS,S. AT 0203 ON 08/20/2017 BY EVA Performed at [facility]    Culture (A)  Final    STAPHYLOCOCCUS SPECIES (COAGULASE NEGATIVE) SUSCEPTIBILITIES PERFORMED ON PREVIOUS CULTURE WITHIN THE LAST 5 DAYS. Performed at [facility], [address] [place], [place] [address]    Report Status 08/22/2017 FINAL  Final  Blood Culture ID Panel (Reflexed)     Status: Abnormal   Collection Time: 08/18/17  9:42 PM  Result Value Ref Range Status   Enterococcus species NOT DETECTED NOT DETECTED Final   Listeria monocytogenes NOT DETECTED NOT DETECTED Final   Staphylococcus species DETECTED (A) NOT DETECTED Final    Comment: Methicillin (oxacillin) resistant coagulase negative staphylococcus. Possible blood culture  contaminant (unless isolated from more  than one blood culture draw or clinical case suggests pathogenicity). No antibiotic treatment is indicated for blood  culture contaminants. L. Seay Pharm.D. 7:45 08/20/17 (wilsonm)    Staphylococcus aureus NOT DETECTED NOT DETECTED Final   Methicillin resistance DETECTED (A) NOT DETECTED Final    Comment: CRITICAL RESULT CALLED TO, READ BACK BY AND VERIFIED WITH: L. Seay Pharm.D. 7:45 08/20/17 ([email or web address])    Streptococcus species NOT DETECTED NOT DETECTED Final   Streptococcus agalactiae NOT DETECTED NOT DETECTED Final   Streptococcus pneumoniae NOT DETECTED NOT DETECTED Final   Streptococcus pyogenes NOT DETECTED NOT DETECTED Final   Acinetobacter baumannii NOT DETECTED NOT DETECTED Final   Enterobacteriaceae species NOT DETECTED NOT DETECTED Final   Enterobacter cloacae complex NOT DETECTED NOT DETECTED Final   Escherichia coli NOT DETECTED NOT DETECTED Final   Klebsiella oxytoca NOT DETECTED NOT DETECTED Final   Klebsiella pneumoniae NOT DETECTED NOT DETECTED Final   Proteus species NOT DETECTED NOT DETECTED Final   Serratia marcescens NOT DETECTED NOT DETECTED Final   Haemophilus influenzae NOT DETECTED NOT DETECTED Final   Neisseria meningitidis NOT DETECTED NOT DETECTED Final   Pseudomonas aeruginosa NOT DETECTED NOT DETECTED Final   Candida albicans NOT DETECTED NOT DETECTED Final   Candida glabrata NOT DETECTED NOT DETECTED Final   Candida krusei NOT DETECTED NOT DETECTED Final   Candida parapsilosis NOT DETECTED NOT DETECTED Final   Candida tropicalis NOT DETECTED NOT DETECTED Final    Comment: Performed at [facility], [address], [place], [place] [address]  Urine culture     Status: None   Collection Time: 08/18/17  9:46 PM  Result Value Ref Range Status   Specimen Description   Final    URINE, CLEAN CATCH Performed at [facility], [address] [place], [place] [address]    Special Requests   Final    NONE Performed at [facility], [address]  [place], [place] [address]    Culture   Final    NO GROWTH Performed at [facility], [address] [place], [place] [address]    Report Status 08/20/2017 FINAL  Final  Culture, blood (Routine x 2)     Status: Abnormal   Collection Time: 08/18/17  9:51 PM  Result Value Ref Range Status   Specimen Description   Final    BLOOD LEFT FOREARM Performed at [facility], [address], [place], [place] [address]    Special Requests   Final    BOTTLES DRAWN AEROBIC ONLY Blood Culture adequate volume Performed at [facility], [address] [place], [place] [address]    Culture  Setup Time   Final    GRAM POSITIVE COCCI Gram Stain Report Called to,Read Back By and Verified With: HOWARD,C. AT 2138 ON 08/19/2017 BY EVA AEROBIC BOTTLE ONLY Performed at [facility]    Culture STAPHYLOCOCCUS SPECIES (COAGULASE NEGATIVE) (A)  Final   Report Status 08/22/2017 FINAL  Final   Organism ID, Bacteria STAPHYLOCOCCUS SPECIES (COAGULASE NEGATIVE)  Final      Susceptibility   Staphylococcus species (coagulase negative) - MIC*    CIPROFLOXACIN >=8 RESISTANT Resistant     ERYTHROMYCIN >=8 RESISTANT Resistant     GENTAMICIN >=16 RESISTANT Resistant     OXACILLIN >=4 RESISTANT Resistant     TETRACYCLINE 2 SENSITIVE Sensitive     VANCOMYCIN 1 SENSITIVE Sensitive     TRIMETH/SULFA 80 RESISTANT Resistant     CLINDAMYCIN >=  8 RESISTANT Resistant     RIFAMPIN <=0.5 SENSITIVE Sensitive     Inducible Clindamycin NEGATIVE Sensitive     * STAPHYLOCOCCUS SPECIES (COAGULASE NEGATIVE)  Blood Culture ID Panel (Reflexed)     Status: Abnormal   Collection Time: 08/18/17  9:51 PM  Result Value Ref Range Status   Enterococcus species NOT DETECTED NOT DETECTED Final   Listeria monocytogenes NOT DETECTED NOT DETECTED Final   Staphylococcus species DETECTED (A) NOT DETECTED Final    Comment: Methicillin (oxacillin) resistant coagulase negative staphylococcus. Possible blood culture contaminant (unless isolated  from more than one blood culture draw or clinical case suggests pathogenicity). No antibiotic treatment is indicated for blood  culture contaminants. CRITICAL RESULT CALLED TO, READ BACK BY AND VERIFIED WITH: TO SGANNONS(RN) BY TCLEVELAND 08/20/17 AT 5:44AM    Staphylococcus aureus NOT DETECTED NOT DETECTED Final   Methicillin resistance DETECTED (A) NOT DETECTED Final    Comment: CRITICAL RESULT CALLED TO, READ BACK BY AND VERIFIED WITH: TO SGANNONS(RN) BY TCLEVELAND 08/20/17 AT 5:44AM    Streptococcus species NOT DETECTED NOT DETECTED Final   Streptococcus agalactiae NOT DETECTED NOT DETECTED Final   Streptococcus pneumoniae NOT DETECTED NOT DETECTED Final   Streptococcus pyogenes NOT DETECTED NOT DETECTED Final   Acinetobacter baumannii NOT DETECTED NOT DETECTED Final   Enterobacteriaceae species NOT DETECTED NOT DETECTED Final   Enterobacter cloacae complex NOT DETECTED NOT DETECTED Final   Escherichia coli NOT DETECTED NOT DETECTED Final   Klebsiella oxytoca NOT DETECTED NOT DETECTED Final   Klebsiella pneumoniae NOT DETECTED NOT DETECTED Final   Proteus species NOT DETECTED NOT DETECTED Final   Serratia marcescens NOT DETECTED NOT DETECTED Final   Haemophilus influenzae NOT DETECTED NOT DETECTED Final   Neisseria meningitidis NOT DETECTED NOT DETECTED Final   Pseudomonas aeruginosa NOT DETECTED NOT DETECTED Final   Candida albicans NOT DETECTED NOT DETECTED Final   Candida glabrata NOT DETECTED NOT DETECTED Final   Candida krusei NOT DETECTED NOT DETECTED Final   Candida parapsilosis NOT DETECTED NOT DETECTED Final   Candida tropicalis NOT DETECTED NOT DETECTED Final    Comment: Performed at [facility], [address], [place], [place] [address]  MRSA PCR Screening     Status: None   Collection Time: 08/19/17  1:09 AM  Result Value Ref Range Status   MRSA by PCR NEGATIVE NEGATIVE Final    Comment:        The GeneXpert MRSA Assay (FDA approved for NASAL specimens only),  is one component of a comprehensive MRSA colonization surveillance program. It is not intended to diagnose MRSA infection nor to guide or monitor treatment for MRSA infections. Performed at [facility], [address], [place], [place] [address]   Respiratory Panel by PCR     Status: None   Collection Time: 08/19/17  1:55 AM  Result Value Ref Range Status   Adenovirus NOT DETECTED NOT DETECTED Final   Coronavirus 229E NOT DETECTED NOT DETECTED Final   Coronavirus HKU1 NOT DETECTED NOT DETECTED Final   Coronavirus NL63 NOT DETECTED NOT DETECTED Final   Coronavirus OC43 NOT DETECTED NOT DETECTED Final   Metapneumovirus NOT DETECTED NOT DETECTED Final   Rhinovirus / Enterovirus NOT DETECTED NOT DETECTED Final   Influenza A NOT DETECTED NOT DETECTED Final   Influenza B NOT DETECTED NOT DETECTED Final   Parainfluenza Virus 1 NOT DETECTED NOT DETECTED Final   Parainfluenza Virus 2 NOT DETECTED NOT DETECTED Final   Parainfluenza Virus 3 NOT  DETECTED NOT DETECTED Final   Parainfluenza Virus 4 NOT DETECTED NOT DETECTED Final   Respiratory Syncytial Virus NOT DETECTED NOT DETECTED Final   Bordetella pertussis NOT DETECTED NOT DETECTED Final   Chlamydophila pneumoniae NOT DETECTED NOT DETECTED Final   Mycoplasma pneumoniae NOT DETECTED NOT DETECTED Final    Comment: Performed at [facility], [address] [place], [place] [address]  Culture, respiratory (NON-Expectorated)     Status: None   Collection Time: 08/19/17  2:40 AM  Result Value Ref Range Status   Specimen Description   Final    TRACHEAL ASPIRATE Performed at [facility], [address] [place], [place] [address]    Special Requests   Final    NONE Performed at [facility], [address], [place], [place] [address]    Gram Stain   Final    ABUNDANT WBC PRESENT, PREDOMINANTLY PMN RARE SQUAMOUS EPITHELIAL CELLS PRESENT ABUNDANT GRAM POSITIVE COCCI IN PAIRS IN CLUSTERS FEW GRAM POSITIVE RODS FEW GRAM NEGATIVE  RODS    Culture   Final    Consistent with normal respiratory flora. Performed at [facility], [address], [place], [place] [address]    Report Status 08/21/2017 FINAL  Final  Culture, blood (Routine X 2) w Reflex to ID Panel     Status: None   Collection Time: 08/20/17  7:51 AM  Result Value Ref Range Status   Specimen Description BLOOD LEFT HAND  Final   Special Requests   Final    BOTTLES DRAWN AEROBIC AND ANAEROBIC Blood Culture adequate volume   Culture   Final    NO GROWTH 5 DAYS Performed at [facility], [address] [place], [place] [address]    Report Status 08/25/2017 FINAL  Final  Culture, blood (Routine X 2) w Reflex to ID Panel     Status: None   Collection Time: 08/20/17  7:52 AM  Result Value Ref Range Status   Specimen Description BLOOD LEFT WRIST  Final   Special Requests   Final    BOTTLES DRAWN AEROBIC AND ANAEROBIC Blood Culture adequate volume   Culture   Final    NO GROWTH 5 DAYS Performed at [facility], [address], [place], [place] [address]    Report Status 08/25/2017 FINAL  Final     Labs: Basic Metabolic Panel: Recent Labs  Lab 08/21/17 1258 08/22/17 0531 08/24/17 0451 08/27/17 0433  NA 141 139 135 135  K 3.4* 3.2* 3.1* 3.6  CL 110 107 101 100*  CO2 23 24 26 26   GLUCOSE 112* 108* 107* 115*  BUN 31* 27* 15 15  CREATININE 0.30* <0.30* <0.30* <0.30*  CALCIUM 8.3* 8.4* 8.3* 8.8*   Liver Function Tests: Recent Labs  Lab 08/21/17 1258  AST 52*  ALT 56  ALKPHOS 279*  BILITOT 0.9  PROT 5.6*  ALBUMIN 1.6*   No results for input(s): LIPASE, AMYLASE in the last 168 hours. No results for input(s): AMMONIA in the last 168 hours. CBC: Recent Labs  Lab 08/21/17 1258 08/22/17 0531 08/24/17 0451  WBC 12.5* 9.5 7.7  HGB 8.4* 8.2* 8.8*  HCT 28.8* 27.5* 29.5*  MCV 87.8 86.8 85.0  PLT 224 201 228   Cardiac Enzymes: No results for input(s): CKTOTAL, CKMB, CKMBINDEX, TROPONINI in the last 168 hours. BNP: BNP (last 3  results) Recent Labs    08/20/17 0405  BNP 66.0    ProBNP (last 3 results) No results for input(s): PROBNP in the  last 8760 hours.  CBG: Recent Labs  Lab 08/21/17 1608  GLUCAP 120*       Signed:  Chaya Jan  [facility] Pager: [phone] 08/27/2017, 11:57 AM

## 2017-08-28 DIAGNOSIS — J189 Pneumonia, unspecified organism: Secondary | ICD-10-CM | POA: Diagnosis not present

## 2017-08-28 DIAGNOSIS — E46 Unspecified protein-calorie malnutrition: Secondary | ICD-10-CM | POA: Diagnosis not present

## 2017-08-28 DIAGNOSIS — I679 Cerebrovascular disease, unspecified: Secondary | ICD-10-CM | POA: Diagnosis not present

## 2017-08-28 DIAGNOSIS — T790XXA Air embolism (traumatic), initial encounter: Secondary | ICD-10-CM | POA: Diagnosis not present

## 2017-08-31 DIAGNOSIS — N400 Enlarged prostate without lower urinary tract symptoms: Secondary | ICD-10-CM | POA: Diagnosis not present

## 2017-08-31 DIAGNOSIS — I82A11 Acute embolism and thrombosis of right axillary vein: Secondary | ICD-10-CM | POA: Diagnosis not present

## 2017-08-31 DIAGNOSIS — G8250 Quadriplegia, unspecified: Secondary | ICD-10-CM | POA: Diagnosis not present

## 2017-08-31 DIAGNOSIS — I10 Essential (primary) hypertension: Secondary | ICD-10-CM | POA: Diagnosis not present

## 2017-09-03 DIAGNOSIS — L89150 Pressure ulcer of sacral region, unstageable: Secondary | ICD-10-CM | POA: Diagnosis not present

## 2017-09-03 DIAGNOSIS — L97919 Non-pressure chronic ulcer of unspecified part of right lower leg with unspecified severity: Secondary | ICD-10-CM | POA: Diagnosis not present

## 2017-09-06 ENCOUNTER — Inpatient Hospital Stay (HOSPITAL_COMMUNITY)
Admission: EM | Admit: 2017-09-06 | Discharge: 2017-09-11 | DRG: 871 | Disposition: A | Discharge status: Skilled Nursing Facility | Payer: Medicare Other | Attending: Internal Medicine | Admitting: Internal Medicine

## 2017-09-06 ENCOUNTER — Encounter (HOSPITAL_COMMUNITY): Payer: Self-pay

## 2017-09-06 ENCOUNTER — Emergency Department (HOSPITAL_COMMUNITY): Payer: Medicare Other

## 2017-09-06 DIAGNOSIS — M109 Gout, unspecified: Secondary | ICD-10-CM | POA: Diagnosis present

## 2017-09-06 DIAGNOSIS — R0603 Acute respiratory distress: Secondary | ICD-10-CM | POA: Diagnosis not present

## 2017-09-06 DIAGNOSIS — Z930 Tracheostomy status: Secondary | ICD-10-CM

## 2017-09-06 DIAGNOSIS — I2510 Atherosclerotic heart disease of native coronary artery without angina pectoris: Secondary | ICD-10-CM | POA: Diagnosis present

## 2017-09-06 DIAGNOSIS — L89122 Pressure ulcer of left upper back, stage 2: Secondary | ICD-10-CM | POA: Diagnosis present

## 2017-09-06 DIAGNOSIS — Z7901 Long term (current) use of anticoagulants: Secondary | ICD-10-CM

## 2017-09-06 DIAGNOSIS — I639 Cerebral infarction, unspecified: Secondary | ICD-10-CM | POA: Diagnosis present

## 2017-09-06 DIAGNOSIS — E782 Mixed hyperlipidemia: Secondary | ICD-10-CM | POA: Diagnosis present

## 2017-09-06 DIAGNOSIS — Y95 Nosocomial condition: Secondary | ICD-10-CM | POA: Diagnosis present

## 2017-09-06 DIAGNOSIS — A419 Sepsis, unspecified organism: Secondary | ICD-10-CM | POA: Diagnosis not present

## 2017-09-06 DIAGNOSIS — E870 Hyperosmolality and hypernatremia: Secondary | ICD-10-CM | POA: Diagnosis present

## 2017-09-06 DIAGNOSIS — E876 Hypokalemia: Secondary | ICD-10-CM | POA: Diagnosis not present

## 2017-09-06 DIAGNOSIS — Z9911 Dependence on respirator [ventilator] status: Secondary | ICD-10-CM

## 2017-09-06 DIAGNOSIS — L89609 Pressure ulcer of unspecified heel, unspecified stage: Secondary | ICD-10-CM | POA: Diagnosis present

## 2017-09-06 DIAGNOSIS — R4182 Altered mental status, unspecified: Secondary | ICD-10-CM | POA: Diagnosis not present

## 2017-09-06 DIAGNOSIS — E43 Unspecified severe protein-calorie malnutrition: Secondary | ICD-10-CM

## 2017-09-06 DIAGNOSIS — L89153 Pressure ulcer of sacral region, stage 3: Secondary | ICD-10-CM | POA: Diagnosis not present

## 2017-09-06 DIAGNOSIS — J9621 Acute and chronic respiratory failure with hypoxia: Secondary | ICD-10-CM | POA: Diagnosis not present

## 2017-09-06 DIAGNOSIS — R0902 Hypoxemia: Secondary | ICD-10-CM | POA: Diagnosis not present

## 2017-09-06 DIAGNOSIS — G20 Parkinson's disease: Secondary | ICD-10-CM | POA: Diagnosis present

## 2017-09-06 DIAGNOSIS — I739 Peripheral vascular disease, unspecified: Secondary | ICD-10-CM

## 2017-09-06 DIAGNOSIS — J189 Pneumonia, unspecified organism: Secondary | ICD-10-CM | POA: Diagnosis present

## 2017-09-06 DIAGNOSIS — I10 Essential (primary) hypertension: Secondary | ICD-10-CM | POA: Diagnosis present

## 2017-09-06 DIAGNOSIS — Z6822 Body mass index (BMI) 22.0-22.9, adult: Secondary | ICD-10-CM | POA: Diagnosis not present

## 2017-09-06 DIAGNOSIS — L89150 Pressure ulcer of sacral region, unstageable: Secondary | ICD-10-CM

## 2017-09-06 DIAGNOSIS — L97909 Non-pressure chronic ulcer of unspecified part of unspecified lower leg with unspecified severity: Secondary | ICD-10-CM | POA: Diagnosis not present

## 2017-09-06 DIAGNOSIS — Z515 Encounter for palliative care: Secondary | ICD-10-CM | POA: Diagnosis not present

## 2017-09-06 DIAGNOSIS — N400 Enlarged prostate without lower urinary tract symptoms: Secondary | ICD-10-CM | POA: Diagnosis present

## 2017-09-06 DIAGNOSIS — Z885 Allergy status to narcotic agent status: Secondary | ICD-10-CM

## 2017-09-06 DIAGNOSIS — Z8673 Personal history of transient ischemic attack (TIA), and cerebral infarction without residual deficits: Secondary | ICD-10-CM | POA: Diagnosis not present

## 2017-09-06 DIAGNOSIS — Z7189 Other specified counseling: Secondary | ICD-10-CM | POA: Diagnosis not present

## 2017-09-06 DIAGNOSIS — Z86718 Personal history of other venous thrombosis and embolism: Secondary | ICD-10-CM | POA: Diagnosis not present

## 2017-09-06 DIAGNOSIS — Z7401 Bed confinement status: Secondary | ICD-10-CM | POA: Diagnosis not present

## 2017-09-06 DIAGNOSIS — Z888 Allergy status to other drugs, medicaments and biological substances status: Secondary | ICD-10-CM

## 2017-09-06 DIAGNOSIS — R0602 Shortness of breath: Secondary | ICD-10-CM | POA: Diagnosis not present

## 2017-09-06 DIAGNOSIS — Z87891 Personal history of nicotine dependence: Secondary | ICD-10-CM | POA: Diagnosis not present

## 2017-09-06 DIAGNOSIS — Z931 Gastrostomy status: Secondary | ICD-10-CM

## 2017-09-06 DIAGNOSIS — Z79899 Other long term (current) drug therapy: Secondary | ICD-10-CM

## 2017-09-06 DIAGNOSIS — F329 Major depressive disorder, single episode, unspecified: Secondary | ICD-10-CM | POA: Diagnosis present

## 2017-09-06 HISTORY — DX: Sepsis, unspecified organism: A41.9

## 2017-09-06 HISTORY — DX: Major depressive disorder, single episode, unspecified: F32.9

## 2017-09-06 HISTORY — DX: Unspecified protein-calorie malnutrition: E46

## 2017-09-06 HISTORY — DX: Hyperlipidemia, unspecified: E78.5

## 2017-09-06 HISTORY — DX: Tracheostomy status: Z93.0

## 2017-09-06 HISTORY — DX: Pressure ulcer of sacral region, unspecified stage: L89.159

## 2017-09-06 HISTORY — DX: Depression, unspecified: F32.A

## 2017-09-06 HISTORY — DX: Parkinson's disease: G20

## 2017-09-06 HISTORY — DX: Pneumonia, unspecified organism: J18.9

## 2017-09-06 HISTORY — DX: Benign prostatic hyperplasia without lower urinary tract symptoms: N40.0

## 2017-09-06 LAB — COMPREHENSIVE METABOLIC PANEL
ALBUMIN: 2.2 g/dL — AB (ref 3.5–5.0)
ALK PHOS: 301 U/L — AB (ref 38–126)
ALT: 57 U/L (ref 17–63)
ANION GAP: 11 (ref 5–15)
AST: 37 U/L (ref 15–41)
BILIRUBIN TOTAL: 1 mg/dL (ref 0.3–1.2)
BUN: 37 mg/dL — ABNORMAL HIGH (ref 6–20)
CALCIUM: 8.7 mg/dL — AB (ref 8.9–10.3)
CO2: 25 mmol/L (ref 22–32)
Chloride: 113 mmol/L — ABNORMAL HIGH (ref 101–111)
Creatinine, Ser: 0.51 mg/dL — ABNORMAL LOW (ref 0.61–1.24)
GFR calc Af Amer: >60 mL/min (ref >60)
GLUCOSE: 118 mg/dL — AB (ref 65–99)
Potassium: 3.6 mmol/L (ref 3.5–5.1)
Sodium: 149 mmol/L — ABNORMAL HIGH (ref 135–145)
TOTAL PROTEIN: 7.4 g/dL (ref 6.5–8.1)

## 2017-09-06 LAB — CBC WITH DIFFERENTIAL/PLATELET
BASOS ABS: 0 10*3/uL (ref 0.0–0.1)
BASOS PCT: 0 %
EOS PCT: 0 %
Eosinophils Absolute: 0 10*3/uL (ref 0.0–0.7)
HEMATOCRIT: 37.9 % — AB (ref 39.0–52.0)
Hemoglobin: 10.9 g/dL — ABNORMAL LOW (ref 13.0–17.0)
Lymphocytes Relative: 7 %
Lymphs Abs: 1.1 10*3/uL (ref 0.7–4.0)
MCH: 24.9 pg — ABNORMAL LOW (ref 26.0–34.0)
MCHC: 28.8 g/dL — ABNORMAL LOW (ref 30.0–36.0)
MCV: 86.5 fL (ref 78.0–100.0)
MONO ABS: 1.2 10*3/uL — AB (ref 0.1–1.0)
Monocytes Relative: 8 %
Neutro Abs: 13.2 10*3/uL (ref 1.7–7.7)
Neutrophils Relative %: 85 %
PLATELETS: 321 10*3/uL (ref 150–400)
RBC: 4.38 MIL/uL (ref 4.22–5.81)
RDW: 17.6 % — AB (ref 11.5–15.5)
WBC: 15.5 10*3/uL — ABNORMAL HIGH (ref 4.0–10.5)

## 2017-09-06 LAB — URINALYSIS, ROUTINE W REFLEX MICROSCOPIC
BACTERIA UA: NONE SEEN
BILIRUBIN URINE: NEGATIVE
Glucose, UA: NEGATIVE mg/dL
Ketones, ur: NEGATIVE mg/dL
LEUKOCYTES UA: NEGATIVE
NITRITE: NEGATIVE
PROTEIN: 30 mg/dL — AB
Specific Gravity, Urine: 1.023 (ref 1.005–1.030)
pH: 6 (ref 5.0–8.0)

## 2017-09-06 LAB — BRAIN NATRIURETIC PEPTIDE: B Natriuretic Peptide: 70 pg/mL (ref 0.0–100.0)

## 2017-09-06 LAB — TROPONIN I: Troponin I: 0.04 ng/mL (ref <0.03)

## 2017-09-06 LAB — INFLUENZA PANEL BY PCR (TYPE A & B)
Influenza A By PCR: NEGATIVE
Influenza B By PCR: NEGATIVE

## 2017-09-06 LAB — I-STAT CG4 LACTIC ACID, ED: Lactic Acid, Venous: 1.37 mmol/L (ref 0.5–1.9)

## 2017-09-06 MED ORDER — VANCOMYCIN HCL IN DEXTROSE 1-5 GM/200ML-% IV SOLN
1000.0000 mg | Freq: Two times a day (BID) | INTRAVENOUS | Status: DC
  Administered 2017-09-06 – 2017-09-10 (×8): 1000 mg via INTRAVENOUS
  Filled 2017-09-06 (×9): qty 200

## 2017-09-06 MED ORDER — ALLOPURINOL 100 MG PO TABS
100.0000 mg | ORAL_TABLET | ORAL | Status: DC
  Administered 2017-09-07 – 2017-09-10 (×2): 100 mg
  Filled 2017-09-06 (×3): qty 1

## 2017-09-06 MED ORDER — ACETAMINOPHEN 650 MG RE SUPP
RECTAL | Status: AC
  Filled 2017-09-06: qty 1

## 2017-09-06 MED ORDER — TAMSULOSIN HCL 0.4 MG PO CAPS
0.4000 mg | ORAL_CAPSULE | Freq: Every day | ORAL | Status: DC
  Administered 2017-09-06 – 2017-09-11 (×6): 0.4 mg via ORAL
  Filled 2017-09-06 (×6): qty 1

## 2017-09-06 MED ORDER — SODIUM CHLORIDE 0.9 % IV SOLN
INTRAVENOUS | Status: AC
  Administered 2017-09-06: 18:00:00 via INTRAVENOUS

## 2017-09-06 MED ORDER — PIPERACILLIN-TAZOBACTAM 3.375 G IVPB
3.3750 g | Freq: Three times a day (TID) | INTRAVENOUS | Status: DC
  Administered 2017-09-06 – 2017-09-10 (×12): 3.375 g via INTRAVENOUS
  Filled 2017-09-06 (×12): qty 50

## 2017-09-06 MED ORDER — ACETAMINOPHEN 650 MG RE SUPP
650.0000 mg | Freq: Once | RECTAL | Status: AC
  Administered 2017-09-06: 650 mg via RECTAL

## 2017-09-06 MED ORDER — SERTRALINE HCL 50 MG PO TABS
50.0000 mg | ORAL_TABLET | Freq: Every day | ORAL | Status: DC
  Administered 2017-09-06 – 2017-09-11 (×6): 50 mg
  Filled 2017-09-06 (×6): qty 1

## 2017-09-06 MED ORDER — PRO-STAT SUGAR FREE PO LIQD: 30.0000 mL | Freq: Two times a day (BID) | ORAL | Status: DC

## 2017-09-06 MED ORDER — PIPERACILLIN-TAZOBACTAM 3.375 G IVPB 30 MIN
3.3750 g | Freq: Once | INTRAVENOUS | Status: AC
  Administered 2017-09-06: 3.375 g via INTRAVENOUS
  Filled 2017-09-06: qty 50

## 2017-09-06 MED ORDER — VANCOMYCIN HCL IN DEXTROSE 1-5 GM/200ML-% IV SOLN
1000.0000 mg | Freq: Once | INTRAVENOUS | Status: AC
  Administered 2017-09-06: 1000 mg via INTRAVENOUS
  Filled 2017-09-06: qty 200

## 2017-09-06 MED ORDER — METOPROLOL TARTRATE 25 MG PO TABS
37.5000 mg | ORAL_TABLET | Freq: Every day | ORAL | Status: DC
  Administered 2017-09-06: 37.5 mg via ORAL
  Filled 2017-09-06: qty 2

## 2017-09-06 MED ORDER — POLYETHYLENE GLYCOL 3350 17 GM/SCOOP PO POWD
17.0000 g | Freq: Every day | ORAL | Status: DC
  Filled 2017-09-06: qty 255

## 2017-09-06 MED ORDER — BIOTENE DRY MOUTH MT LIQD
5.0000 mL | OROMUCOSAL | Status: DC | PRN
  Administered 2017-09-10: 5 mL via OROMUCOSAL
  Filled 2017-09-06: qty 15

## 2017-09-06 MED ORDER — SODIUM CHLORIDE 0.9 % IV BOLUS (SEPSIS)
1000.0000 mL | Freq: Once | INTRAVENOUS | Status: AC
  Administered 2017-09-06: 1000 mL via INTRAVENOUS

## 2017-09-06 MED ORDER — METOPROLOL TARTRATE 37.5 MG PO TABS: 1.0000 | ORAL_TABLET | Freq: Every day | ORAL | Status: DC

## 2017-09-06 MED ORDER — FAMOTIDINE 20 MG PO TABS
20.0000 mg | ORAL_TABLET | Freq: Two times a day (BID) | ORAL | Status: DC
  Administered 2017-09-06 – 2017-09-11 (×10): 20 mg
  Filled 2017-09-06 (×10): qty 1

## 2017-09-06 MED ORDER — AMANTADINE HCL 100 MG PO CAPS
100.0000 mg | ORAL_CAPSULE | Freq: Two times a day (BID) | ORAL | Status: DC
  Administered 2017-09-06 – 2017-09-11 (×10): 100 mg
  Filled 2017-09-06 (×14): qty 1

## 2017-09-06 MED ORDER — MORPHINE SULFATE (PF) 2 MG/ML IV SOLN
2.0000 mg | INTRAVENOUS | Status: DC | PRN
  Administered 2017-09-06 – 2017-09-09 (×5): 2 mg via INTRAVENOUS
  Filled 2017-09-06 (×5): qty 1

## 2017-09-06 MED ORDER — PRO-STAT SUGAR FREE PO LIQD
30.0000 mL | Freq: Two times a day (BID) | ORAL | Status: DC
  Administered 2017-09-06 – 2017-09-07 (×2): 30 mL
  Filled 2017-09-06 (×2): qty 30

## 2017-09-06 MED ORDER — SODIUM CHLORIDE 0.9 % IV BOLUS (SEPSIS)
500.0000 mL | Freq: Once | INTRAVENOUS | Status: AC
  Administered 2017-09-06: 500 mL via INTRAVENOUS

## 2017-09-06 MED ORDER — MODAFINIL 200 MG PO TABS: 100.0000 mg | ORAL_TABLET | Freq: Every day | ORAL | Status: DC

## 2017-09-06 MED ORDER — IPRATROPIUM-ALBUTEROL 0.5-2.5 (3) MG/3ML IN SOLN
3.0000 mL | Freq: Four times a day (QID) | RESPIRATORY_TRACT | Status: DC
  Administered 2017-09-06 – 2017-09-11 (×19): 3 mL via RESPIRATORY_TRACT
  Filled 2017-09-06 (×20): qty 3

## 2017-09-06 MED ORDER — APIXABAN 2.5 MG PO TABS
2.5000 mg | ORAL_TABLET | Freq: Two times a day (BID) | ORAL | Status: DC
  Administered 2017-09-06 – 2017-09-11 (×10): 2.5 mg
  Filled 2017-09-06 (×10): qty 1

## 2017-09-06 MED ORDER — MODAFINIL 200 MG PO TABS
100.0000 mg | ORAL_TABLET | Freq: Every day | ORAL | Status: DC
  Administered 2017-09-06 – 2017-09-11 (×6): 100 mg
  Filled 2017-09-06 (×6): qty 1

## 2017-09-06 NOTE — ED Triage Notes (Signed)
Per [facility] staff, pt in acute respiratory distress.  Reports axillary temp 101.6, rr 46, o2 sat 80's on 100% trache mask.  Reports pt has a size 4 shiley cuffless.  Pt unresponsive at baseline.  Reports recent pneumonia.  cbg 140.

## 2017-09-06 NOTE — H&P (Signed)
History and Physical    Richard Mcpherson EML:[id number] DOB: 02/23/1941 DOA: 09/06/2017  PCP: [name], MD   I have briefly reviewed patients previous medical reports in [facility].  Patient coming from: SNF  Chief Complaint: Fever, tachypnea, decreased oxygen saturation and increased respiratory secretions.  HPI: Richard Mcpherson is a 77 year old male with a past medical history significant for prior CVA currently on Eliquis, subsequent tracheostomy and PEG tube placement, hypertension, dyslipidemia, gout and severe protein malnutrition; who presented to the ED from nursing home secondary to increase tachypnea, oxygen desaturation, increased secretions out of his tracheostomy and elevated fever.  Note patient was recently admitted over 10 days ago with HCAP and bacteremia.  There has not been any mentions of signs of aspiration, chest pain, abdominal pain, abdominal distention, blood in his urine, nausea, vomiting, melena, hematemesis or any other complaints.  ED Course: patient met sepsis criteria on presentation (Temp of 104, tachycardic, elevated RR, elevated WBX's and CXR findings suggesting PNA); received IVF's, blood cx's taken and broad spectrum antibiotics started. CXR demonstrating left basilar opacity consistent with PNA.  Review of Systems:  Unable to be reviewed with patient as he is non-verbal. But per wife no other complaints except as mentioned on HPI.  Past Medical History:  Diagnosis Date  . Atrial premature beats   . Blood in stool   . BPH (benign prostatic hyperplasia)   . Cerebral aneurysm   . Coronary atherosclerosis of native coronary artery   . Depression   . Embolism ([place])    History of right lower extremity distal embolism   . Gout   . Hyperglycemia   . Hyperlipidemia   . Hyperlipidemia, mixed   . Osteoarthritis   . Parkinson disease ([place])   . Plantar fasciitis   . Pneumonia   . Pressure ulcer of sacral region   . Protein calorie malnutrition  ([place])   . Sepsis ([place])   . Sinusitis   . Stroke ([place])   . Subarachnoid hemorrhage ([place])   . Tracheostomy dependent ([place])   . Unspecified essential hypertension     Past Surgical History:  Procedure Laterality Date  . APPENDECTOMY    . cardiac catherization    . CATARACT EXTRACTION W/PHACO Right 02/19/2014   Procedure: CATARACT EXTRACTION PHACO AND INTRAOCULAR LENS PLACEMENT (IOC);  Surgeon: Tonny Branch, MD;  Location: [facility];  Service: Ophthalmology;  Laterality: Right;  CDE 12.71  . CATARACT EXTRACTION W/PHACO Left 12/28/2014   Procedure: CATARACT EXTRACTION PHACO AND INTRAOCULAR LENS PLACEMENT (IOC);  Surgeon: Tonny Branch, MD;  Location: [facility];  Service: Ophthalmology;  Laterality: Left;  CDE:8.05  . embolectomy 2000    . IR GASTROSTOMY TUBE MOD SED  08/06/2017    Social History  reports that he has quit smoking. He has a 40.00 pack-year smoking history. His smokeless tobacco use includes snuff. He reports that he drinks alcohol. He reports that he does not use drugs.  Allergies  Allergen Reactions  . Codeine Nausea Only and Other (See Comments)  . Crestor [Rosuvastatin Calcium] Other (See Comments)    bodyache  . Lipitor [Atorvastatin] Other (See Comments)    Body aches  . Statins   . Allopurinol Rash    Family History  Problem Relation Age of Onset  . Cancer Mother     Prior to Admission medications   Medication Sig Start Date End Date Taking? Authorizing Provider  allopurinol (ZYLOPRIM) 100 MG tablet Place 100 mg into feeding tube 3 (three) times  a week. Every Tuesday, Thursday, Sunday   Yes [provider]  amantadine (SYMMETREL) 100 MG capsule Place 100 mg into feeding tube 2 (two) times daily.   Yes [provider]  Amino Acids-Protein Hydrolys (FEEDING SUPPLEMENT, PRO-STAT SUGAR FREE 64,) LIQD Take 30 mLs by mouth 2 (two) times daily with a meal. For wound healing.   Yes [provider]  antiseptic oral rinse (BIOTENE) LIQD 5 mLs by Mouth  Rinse route as needed for dry mouth.   Yes [provider]  apixaban (ELIQUIS) 2.5 MG TABS tablet Place 2.5 mg into feeding tube 2 (two) times daily.   Yes [provider]  collagenase (SANTYL) ointment Apply 1 application topically daily. Right buttocks area   Yes [provider]  famotidine (PEPCID) 20 MG tablet Place 20 mg into feeding tube 2 (two) times daily.   Yes [provider]  Melatonin 3 MG TABS Take 1 tablet by mouth at bedtime.   Yes [provider]  Metoprolol Tartrate 37.5 MG TABS Place 1 tablet into feeding tube daily.   Yes [provider]  modafinil (PROVIGIL) 100 MG tablet Take 100 mg by mouth daily. Via G-tube for sleep   Yes [provider]  Omega-3 Fatty Acids (FISH OIL) 1000 MG CAPS Place 1 capsule into feeding tube daily.   Yes [provider]  polyethylene glycol powder (GLYCOLAX/MIRALAX) powder Take 17 g by mouth daily.   Yes [provider]  sertraline (ZOLOFT) 50 MG tablet Place 50 mg into feeding tube daily.   Yes [provider]  tamsulosin (FLOMAX) 0.4 MG CAPS capsule 0.4 mg daily. Per G-Tube   Yes [provider]    Physical Exam: Vitals:   09/06/17 1534 09/06/17 1600 09/06/17 1633 09/06/17 1700  BP:  120/70  123/68  Pulse:  98 (!) 102 (!) 101  Resp:  (!) 31 (!) 30 (!) 33  Temp: 98.8 F (37.1 C)  98.7 F (37.1 C)   TempSrc: Axillary  Axillary   SpO2:  96% 96% 94%  Weight: 74.6 kg (164 lb 7.4 oz)     Height: 6' (1.829 m)       Constitutional: non verbal, requiring 60% FIO2 through trach; warm to touch. Eyes: PERTLA, lids and conjunctivae normal, no icterus, no nystagmus  ENMT: Mucous membranes dry. Posterior pharynx clear of any exudate or lesions. No thrush  Neck: supple, no masses, no JVD Respiratory: fair air movement, positive rhonchi, mild exp wheezing  Cardiovascular: S1 & S2 heard, regular rate and rhythm, no murmurs / rubs / gallops. No extremity  edema.  Abdomen: No distension, no tenderness, no masses palpated. Positive BS. Peg tube in place. Musculoskeletal: no clubbing / cyanosis. Patient unable to purposeful move his legs. Skin: multiple pressure ulcers, couple stage 2 on his upper back, deep tissue injury affecting right foot and also non-stageable sacral ulcer (some yellow/darl slough appreciated). Neurologic: patient non-verbal, essentially quadriplegic and bed-ridden. Psychiatric: non-verbal, unable to properly assess   Labs on Admission: I have personally reviewed following labs and imaging studies  CBC: Recent Labs  Lab 09/06/17 0840  WBC 15.5*  NEUTROABS 13.2  HGB 10.9*  HCT 37.9*  MCV 86.5  PLT 301   Basic Metabolic Panel: Recent Labs  Lab 09/06/17 0840  NA 149*  K 3.6  CL 113*  CO2 25  GLUCOSE 118*  BUN 37*  CREATININE 0.51*  CALCIUM 8.7*   Liver Function Tests: Recent Labs  Lab 09/06/17 0840  AST  37  ALT 57  ALKPHOS 301*  BILITOT 1.0  PROT 7.4  ALBUMIN 2.2*   Cardiac Enzymes: Recent Labs  Lab 09/06/17 0840  TROPONINI 0.04*   Urine analysis:    Component Value Date/Time   COLORURINE AMBER (A) 09/06/2017 0826   APPEARANCEUR HAZY (A) 09/06/2017 0826   LABSPEC 1.023 09/06/2017 0826   PHURINE 6.0 09/06/2017 0826   GLUCOSEU NEGATIVE 09/06/2017 0826   HGBUR SMALL (A) 09/06/2017 0826   BILIRUBINUR NEGATIVE 09/06/2017 0826   KETONESUR NEGATIVE 09/06/2017 0826   PROTEINUR 30 (A) 09/06/2017 0826   NITRITE NEGATIVE 09/06/2017 0826   LEUKOCYTESUR NEGATIVE 09/06/2017 0826    Radiological Exams on Admission: Dg Chest Port 1 View  Result Date: 09/06/2017 CLINICAL DATA:  Shortness of breath, fever. EXAM: PORTABLE CHEST 1 VIEW COMPARISON:  Radiograph and CT scan of August 21, 2017. FINDINGS: Stable cardiomediastinal silhouette. Tracheostomy tube is in grossly good position. Atherosclerosis of thoracic aorta is noted. No pneumothorax is noted. Right lung is clear. Left basilar opacity is noted  consistent with pneumonia. Bony thorax is unremarkable. IMPRESSION: Continued left basilar opacity is noted consistent with pneumonia. Associated pleural effusion cannot be excluded. Aortic Atherosclerosis (ICD10-I70.0). Electronically Signed   By: Marijo Conception, M.D.   On: 09/06/2017 08:59    EKG:  Sinus tachycardia, no acute ischemic changes appreciated, normal axis.  Assessment/Plan 1-sepsis secondary to HCAP (healthcare-associated pneumonia) -Patient admitted to stepdown -Continue current oxygen supplementation through his tracheostomy -Brought in for H CAP microorganism using vancomycin and Zosyn -PRN DuoNeb -follow blood cx's, sputum cx and legionella/strep antigen in urine -IVF's resuscitation given in ED as per sepsis protocol, will continue IVF's at 75 cc/hr for another 15 hours and then saline lock. -PRN antipyretics   2-Essential hypertension -BP is stable -continue current antihypertensive regimen   3-hx of Stroke (cerebrum) (HCC)/cervical fusion: -essentially non-mobile and bedridden currently -will most likely require SNF for total care after discharge   4-Acute on chronic respiratory failure with hypoxia ([place]) -due to HCAP -patient trach dependent  -continue oxygen supplementation and wean down amount of supplementation as tolerated  5-multiple Decubitus ulcer of sacral region, unstageable and also affecting his upper back and Right heel -continue preventive measures  -wound care service consulted  6-hx of RUE DVT: -continue eliquis -that would protect him for future DVT and PE  7-Severe protein-calorie malnutrition ([place]) -nutrition service consulted for tube feeding  -continue pro-stat BID  8-hx of GOUT -will continue allopurinol.  9-prior hx of parkinson's disease -continue amantadine   10-depression -continue zoloft  11-hx of BPH -continue flomax  Time: 70 minutes   DVT prophylaxis: Eliquis Code Status: Full code Family Communication: Wife  at bedside Disposition Plan: To be determined. Consults called: Palliative care Admission status: Inpatient, length of state more than 2 midnights; stepdown bed.    Barton Dubois MD [facility] Pager [phone]  If 7PM-7AM, please contact night-coverage [email or web address] Password [facility]  09/06/2017, 6:11 PM

## 2017-09-06 NOTE — ED Notes (Signed)
CRITICAL VALUE ALERT  Critical Value:  Troponin 0.04  Date & Time Notied:  09/06/17 0935  Provider Notified: Ranae Palms MD  Orders Received/Actions taken: none

## 2017-09-06 NOTE — ED Notes (Signed)
Respiratory changed trache to number 4 cuffed at [phone]

## 2017-09-06 NOTE — Progress Notes (Signed)
Pharmacy Antibiotic Note  Richard Mcpherson is a 77 y.o. male admitted on 09/06/2017 with sepsis.  Pharmacy has been consulted for Vancomycin and Zosyn dosing.  Plan:  Vancomycin 1000mg  IV q12h (not given a loading dose so 2nd dose will be earlier) Check trough at steady state Zosyn 3.375gm IV q8h, EID Monitor labs, renal fxn, progress and c/s Deescalate ABX when improved / appropriate.    Height: 6' (182.9 cm) Weight: 164 lb 7.4 oz (74.6 kg) IBW/kg (Calculated) : 77.6  Temp (24hrs), Avg:102 F (38.9 C), Min:98.8 F (37.1 C), Max:104.1 F (40.1 C)  Recent Labs  Lab 09/06/17 0840 09/06/17 0855  WBC 15.5*  --   CREATININE 0.51*  --   LATICACIDVEN  --  1.37    Estimated Creatinine Clearance: 81.6 mL/min (A) (by C-G formula based on SCr of 0.51 mg/dL (L)).    Allergies  Allergen Reactions  . Codeine Nausea Only and Other (See Comments)  . Crestor [Rosuvastatin Calcium] Other (See Comments)    bodyache  . Lipitor [Atorvastatin] Other (See Comments)    Body aches  . Statins   . Allopurinol Rash   Antimicrobials this admission: Vancomycin 2/21 >>  Zosyn 2/21 >>   Dose adjustments this admission:  Microbiology results:  BCx: pending  UCx: pending   Sputum:    MRSA PCR:   Thank you for allowing pharmacy to be a part of this patient's care.  Valrie Hart A 09/06/2017 3:44 PM

## 2017-09-06 NOTE — Progress Notes (Signed)
AT 0830 RT suctioned patient for copious amount of thick yellow secretion patient was on 100% nonrebreather over trach maintaining SATs 84-88%, HR 125 and RR 44 RN at bedside. RT changed #4 CFS trach to #4 cuffed trach, no complications noted, inserted air in cuff and placed patient on 100% trach collar for SATs 96%. RT will continue to monitor and assess.

## 2017-09-06 NOTE — ED Provider Notes (Signed)
[facility] EMERGENCY DEPARTMENT Provider Note   CSN: [id number] Arrival date & time: 09/06/17  6578     History   Chief Complaint Chief Complaint  Patient presents with  . Respiratory Distress    HPI Richard Mcpherson is a 77 y.o. male.  HPI Patient is nonverbal.  Level 5 caveat applies.  Patient comes from nursing home for fever and shortness of breath.  Patient has trach in place and is nonverbal at baseline. Past Medical History:  Diagnosis Date  . Atrial premature beats   . Blood in stool   . BPH (benign prostatic hyperplasia)   . Cerebral aneurysm   . Coronary atherosclerosis of native coronary artery   . Depression   . Embolism (HCC)    History of right lower extremity distal embolism   . Gout   . Hyperglycemia   . Hyperlipidemia   . Hyperlipidemia, mixed   . Osteoarthritis   . Parkinson disease (HCC)   . Plantar fasciitis   . Pneumonia   . Pressure ulcer of sacral region   . Protein calorie malnutrition (HCC)   . Sepsis (HCC)   . Sinusitis   . Stroke (HCC)   . Subarachnoid hemorrhage (HCC)   . Tracheostomy dependent (HCC)   . Unspecified essential hypertension     Patient Active Problem List   Diagnosis Date Noted  . HCAP (healthcare-associated pneumonia) 09/06/2017  . Acute respiratory failure with hypoxemia (HCC)   . Palliative care encounter   . Malnutrition of moderate degree 08/21/2017  . Sacral decubitus ulcer, stage III (HCC) 08/20/2017  . Staphylococcal sepsis (HCC) 08/20/2017  . Healthcare-associated pneumonia   . Acute on chronic respiratory failure with hypoxia (HCC) 08/19/2017  . Sepsis due to undetermined organism (HCC) 08/19/2017  . Aspiration pneumonia of both lower lobes due to gastric secretions (HCC) 08/19/2017  . Tracheostomy status (HCC) 08/19/2017  . Deep vein thrombosis (DVT) of right upper extremity (HCC) 08/19/2017  . Lobar pneumonia (HCC) 08/19/2017  . Transaminasemia   . Sepsis (HCC) 08/18/2017  . Pneumonia 08/18/2017    . Acute hypoxemic respiratory failure (HCC) 08/18/2017  . 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 Stroke (cerebrum) (HCC) 04/18/2017  . SAH (subarachnoid hemorrhage) (HCC) 04/18/2017  . 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 Hyperlipidemia 03/25/2009  . Essential hypertension 03/25/2009  . CAD, NATIVE VESSEL 03/25/2009  . PALPITATIONS 03/25/2009    Past Surgical History:  Procedure Laterality Date  . APPENDECTOMY    . cardiac catherization    . CATARACT EXTRACTION W/PHACO Right 02/19/2014   Procedure: CATARACT EXTRACTION PHACO AND INTRAOCULAR LENS PLACEMENT (IOC);  Surgeon: Gemma Payor, MD;  Location: [facility];  Service: Ophthalmology;  Laterality: Right;  CDE 12.71  . CATARACT EXTRACTION W/PHACO Left 12/28/2014   Procedure: CATARACT EXTRACTION PHACO AND INTRAOCULAR LENS PLACEMENT (IOC);  Surgeon: Gemma Payor, MD;  Location: [facility];  Service: Ophthalmology;  Laterality: Left;  CDE:8.05  . embolectomy 2000    . IR GASTROSTOMY TUBE MOD SED  08/06/2017       Home Medications    Prior to Admission medications   Medication Sig Start Date End Date Taking? Authorizing Provider  allopurinol (ZYLOPRIM) 100 MG tablet Place 100 mg into feeding tube 3 (three) times a week. Every Tuesday, Thursday, Sunday   Yes [provider]  amantadine (SYMMETREL) 100 MG capsule Place 100 mg into feeding tube 2 (two) times daily.   Yes [provider]  Amino Acids-Protein  Hydrolys (FEEDING SUPPLEMENT, PRO-STAT SUGAR FREE 64,) LIQD Take 30 mLs by mouth 2 (two) times daily with a meal. For wound healing.   Yes [provider]  antiseptic oral rinse (BIOTENE) LIQD 5 mLs by Mouth Rinse route as needed for dry mouth.   Yes [provider]  apixaban (ELIQUIS) 2.5 MG TABS tablet Place 2.5 mg into feeding tube 2 (two) times daily.   Yes [provider]  collagenase (SANTYL) ointment Apply 1 application topically  daily. Right buttocks area   Yes [provider]  famotidine (PEPCID) 20 MG tablet Place 20 mg into feeding tube 2 (two) times daily.   Yes [provider]  Melatonin 3 MG TABS Take 1 tablet by mouth at bedtime.   Yes [provider]  Metoprolol Tartrate 37.5 MG TABS Place 1 tablet into feeding tube daily.   Yes [provider]  modafinil (PROVIGIL) 100 MG tablet Take 100 mg by mouth daily. Via G-tube for sleep   Yes [provider]  Omega-3 Fatty Acids (FISH OIL) 1000 MG CAPS Place 1 capsule into feeding tube daily.   Yes [provider]  polyethylene glycol powder (GLYCOLAX/MIRALAX) powder Take 17 g by mouth daily.   Yes [provider]  sertraline (ZOLOFT) 50 MG tablet Place 50 mg into feeding tube daily.   Yes [provider]  tamsulosin (FLOMAX) 0.4 MG CAPS capsule 0.4 mg daily. Per G-Tube   Yes [provider]    Family History Family History  Problem Relation Age of Onset  . Cancer Mother     Social History Social History   Tobacco Use  . Smoking status: Former Smoker    Packs/day: 1.00    Years: 40.00    Pack years: 40.00  . Smokeless tobacco: Current User    Types: Snuff  Substance Use Topics  . Alcohol use: Yes    Comment: 1-2 beers every other day   . Drug use: No     Allergies   Codeine; Crestor [rosuvastatin calcium]; Lipitor [atorvastatin]; Statins; and Allopurinol   Review of Systems Review of Systems  Unable to perform ROS: Patient nonverbal     Physical Exam Updated Vital Signs BP 118/65   Pulse (!) 103   Temp (!) 100.9 F (38.3 C) (Rectal)   Resp (!) 32   Wt 84.4 kg (186 lb)   SpO2 97%   BMI 25.23 kg/m   Physical Exam  Constitutional: He appears well-developed and well-nourished.  Chronically ill-appearing  HENT:  Head: Normocephalic and atraumatic.  Eyes: EOM are normal. Pupils are equal, round, and reactive to light.  Neck: Normal range of motion. Neck  supple.  Trach in place with thick yellow mucus production  Cardiovascular: Regular rhythm.  Tachycardia  Pulmonary/Chest: He is in respiratory distress. He has rales.  Increased work of breathing.  Rhonchi throughout  Abdominal: Soft. Bowel sounds are normal. There is no tenderness. There is no rebound and no guarding.  Musculoskeletal: Normal range of motion. He exhibits no edema or tenderness.  Neurological:  Patient is awake.  Not following commands.  Skin: Skin is warm and dry. Capillary refill takes less than 2 seconds. No rash noted. No erythema.  Nursing note and vitals reviewed.    ED Treatments / Results  Labs (all labs ordered are listed, but only abnormal results are displayed) Labs Reviewed  COMPREHENSIVE METABOLIC PANEL - Abnormal; Notable for the following components:      Result Value   Sodium 149 (*)  Chloride 113 (*)    Glucose, Bld 118 (*)    BUN 37 (*)    Creatinine, Ser 0.51 (*)    Calcium 8.7 (*)    Albumin 2.2 (*)    Alkaline Phosphatase 301 (*)    All other components within normal limits  CBC WITH DIFFERENTIAL/PLATELET - Abnormal; Notable for the following components:   WBC 15.5 (*)    Hemoglobin 10.9 (*)    HCT 37.9 (*)    MCH 24.9 (*)    MCHC 28.8 (*)    RDW 17.6 (*)    Monocytes Absolute 1.2 (*)    All other components within normal limits  URINALYSIS, ROUTINE W REFLEX MICROSCOPIC - Abnormal; Notable for the following components:   Color, Urine AMBER (*)    APPearance HAZY (*)    Hgb urine dipstick SMALL (*)    Protein, ur 30 (*)    Squamous Epithelial / LPF 0-5 (*)    All other components within normal limits  TROPONIN I - Abnormal; Notable for the following components:   Troponin I 0.04 (*)    All other components within normal limits  CULTURE, BLOOD (ROUTINE X 2)  CULTURE, BLOOD (ROUTINE X 2)  BRAIN NATRIURETIC PEPTIDE  INFLUENZA PANEL BY PCR (TYPE A & B)  I-STAT CG4 LACTIC ACID, ED    EKG  EKG  Interpretation  Date/Time:  Thursday September 06 2017 08:20:03 EST Ventricular Rate:  134 PR Interval:    QRS Duration: 92 QT Interval:  284 QTC Calculation: 421 R Axis:   -85 Text Interpretation:  Sinus tachycardia Probable left atrial enlargement LAD, consider left anterior fascicular block Low voltage, extremity and precordial leads Abnormal R-wave progression, late transition Baseline wander in lead(s) V4 Confirmed by Loren Racer ([id number]) on 09/06/2017 1:52:28 PM       Radiology Dg Chest Port 1 View  Result Date: 09/06/2017 CLINICAL DATA:  Shortness of breath, fever. EXAM: PORTABLE CHEST 1 VIEW COMPARISON:  Radiograph and CT scan of August 21, 2017. FINDINGS: Stable cardiomediastinal silhouette. Tracheostomy tube is in grossly good position. Atherosclerosis of thoracic aorta is noted. No pneumothorax is noted. Right lung is clear. Left basilar opacity is noted consistent with pneumonia. Bony thorax is unremarkable. IMPRESSION: Continued left basilar opacity is noted consistent with pneumonia. Associated pleural effusion cannot be excluded. Aortic Atherosclerosis (ICD10-I70.0). Electronically Signed   By: Lupita Raider, M.D.   On: 09/06/2017 08:59    Procedures .Critical Care Performed by: Loren Racer, MD Authorized by: Loren Racer, MD   Critical care provider statement:    Critical care time (minutes):  45   Critical care start time:  09/06/2017 8:16 AM   Critical care end time:  09/06/2017 1:53 PM   Critical care time was exclusive of:  Separately billable procedures and treating other patients   Critical care was necessary to treat or prevent imminent or life-threatening deterioration of the following conditions:  Respiratory failure and sepsis   Critical care was time spent personally by me on the following activities:  Development of treatment plan with patient or surrogate, discussions with consultants, evaluation of patient's response to treatment, examination  of patient, re-evaluation of patient's condition, pulse oximetry, ordering and review of radiographic studies, ordering and review of laboratory studies and ordering and performing treatments and interventions   (including critical care time)  Medications Ordered in ED Medications  piperacillin-tazobactam (ZOSYN) IVPB 3.375 g (0 g Intravenous Stopped 09/06/17 0922)  vancomycin (VANCOCIN) IVPB 1000 mg/200 mL premix (  0 mg Intravenous Stopped 09/06/17 1022)  sodium chloride 0.9 % bolus 500 mL (0 mLs Intravenous Stopped 09/06/17 0923)  acetaminophen (TYLENOL) suppository 650 mg (650 mg Rectal Given 09/06/17 0834)  sodium chloride 0.9 % bolus 500 mL (0 mLs Intravenous Stopped 09/06/17 1109)  sodium chloride 0.9 % bolus 1,000 mL (0 mLs Intravenous Stopped 09/06/17 1339)     Initial Impression / Assessment and Plan / ED Course  I have reviewed the triage vital signs and the nursing notes.  Pertinent labs & imaging results that were available during my care of the patient were reviewed by me and considered in my medical decision making (see chart for details).     Initial hypoxia improved after suctioning of thick yellow mucus from the patient's trach by respiratory therapist.  On broad-spectrum antibiotics for presumed respiratory infection.  Trach changed to cuffed trach.  Maintaining saturations on oxygen.  We will attempt to wean off 100% FiO2.  Discussed with hospitalist regarding admission.  Will see patient in the emergency department. Final Clinical Impressions(s) / ED Diagnoses   Final diagnoses:  HCAP (healthcare-associated pneumonia)  Hypoxia    ED Discharge Orders    None       Loren Racer, MD 09/06/17 1354

## 2017-09-07 ENCOUNTER — Inpatient Hospital Stay (HOSPITAL_COMMUNITY): Payer: Medicare Other

## 2017-09-07 DIAGNOSIS — Z7189 Other specified counseling: Secondary | ICD-10-CM

## 2017-09-07 DIAGNOSIS — E870 Hyperosmolality and hypernatremia: Secondary | ICD-10-CM

## 2017-09-07 DIAGNOSIS — Z515 Encounter for palliative care: Secondary | ICD-10-CM

## 2017-09-07 LAB — EXPECTORATED SPUTUM ASSESSMENT W REFEX TO RESP CULTURE

## 2017-09-07 LAB — PHOSPHORUS
PHOSPHORUS: 3.6 mg/dL (ref 2.5–4.6)
PHOSPHORUS: 2.5 mg/dL (ref 2.5–4.6)

## 2017-09-07 LAB — GLUCOSE, CAPILLARY
GLUCOSE-CAPILLARY: 82 mg/dL (ref 65–99)
Glucose-Capillary: 97 mg/dL (ref 65–99)

## 2017-09-07 LAB — STREP PNEUMONIAE URINARY ANTIGEN: Strep Pneumo Urinary Antigen: NEGATIVE

## 2017-09-07 LAB — MAGNESIUM
MAGNESIUM: 1.9 mg/dL (ref 1.7–2.4)
MAGNESIUM: 1.9 mg/dL (ref 1.7–2.4)

## 2017-09-07 LAB — EXPECTORATED SPUTUM ASSESSMENT W GRAM STAIN, RFLX TO RESP C

## 2017-09-07 MED ORDER — COLLAGENASE 250 UNIT/GM EX OINT
TOPICAL_OINTMENT | Freq: Every day | CUTANEOUS | Status: DC
  Administered 2017-09-07 – 2017-09-11 (×4): via TOPICAL
  Filled 2017-09-07: qty 30

## 2017-09-07 MED ORDER — JUVEN PO PACK
1.0000 | PACK | Freq: Two times a day (BID) | ORAL | Status: DC
  Administered 2017-09-07 – 2017-09-11 (×10): 1
  Filled 2017-09-07 (×10): qty 1

## 2017-09-07 MED ORDER — ORAL CARE MOUTH RINSE
15.0000 mL | Freq: Two times a day (BID) | OROMUCOSAL | Status: DC
  Administered 2017-09-07 – 2017-09-11 (×9): 15 mL via OROMUCOSAL

## 2017-09-07 MED ORDER — METOPROLOL TARTRATE 25 MG PO TABS
37.5000 mg | ORAL_TABLET | Freq: Every day | ORAL | Status: DC
  Administered 2017-09-07 – 2017-09-11 (×5): 37.5 mg
  Filled 2017-09-07 (×5): qty 2

## 2017-09-07 MED ORDER — VITAL HIGH PROTEIN PO LIQD: 1000.0000 mL | ORAL | Status: DC

## 2017-09-07 MED ORDER — VITAL AF 1.2 CAL PO LIQD
1000.0000 mL | ORAL | Status: DC
  Administered 2017-09-07 – 2017-09-11 (×3): 1000 mL
  Filled 2017-09-07 (×11): qty 1000

## 2017-09-07 MED ORDER — DEXTROSE-NACL 5-0.45 % IV SOLN
INTRAVENOUS | Status: DC
  Administered 2017-09-07 – 2017-09-11 (×4): via INTRAVENOUS

## 2017-09-07 MED ORDER — POLYETHYLENE GLYCOL 3350 17 G PO PACK
17.0000 g | PACK | Freq: Every day | ORAL | Status: DC
  Administered 2017-09-07 – 2017-09-11 (×3): 17 g
  Filled 2017-09-07 (×4): qty 1

## 2017-09-07 MED ORDER — FREE WATER
100.0000 mL | Freq: Three times a day (TID) | Status: DC
  Administered 2017-09-07 – 2017-09-11 (×13): 100 mL

## 2017-09-07 MED ORDER — IPRATROPIUM BROMIDE 0.02 % IN SOLN
RESPIRATORY_TRACT | Status: AC
  Administered 2017-09-07: 0.5 mg
  Filled 2017-09-07: qty 2.5

## 2017-09-07 MED ORDER — ACETAMINOPHEN 325 MG PO TABS
650.0000 mg | ORAL_TABLET | Freq: Four times a day (QID) | ORAL | Status: DC | PRN
  Administered 2017-09-07 (×3): 650 mg
  Filled 2017-09-07 (×4): qty 2

## 2017-09-07 MED ORDER — JUVEN PO PACK: 1.0000 | PACK | Freq: Two times a day (BID) | ORAL | Status: DC

## 2017-09-07 MED ORDER — POLYETHYLENE GLYCOL 3350 17 G PO PACK: 17.0000 g | PACK | Freq: Every day | ORAL | Status: DC

## 2017-09-07 MED ORDER — CHLORHEXIDINE GLUCONATE 0.12 % MT SOLN
15.0000 mL | Freq: Two times a day (BID) | OROMUCOSAL | Status: DC
  Administered 2017-09-07 – 2017-09-11 (×8): 15 mL via OROMUCOSAL
  Filled 2017-09-07 (×8): qty 15

## 2017-09-07 MED ORDER — ALBUTEROL SULFATE (2.5 MG/3ML) 0.083% IN NEBU
INHALATION_SOLUTION | RESPIRATORY_TRACT | Status: AC
  Administered 2017-09-07: 2.5 mg
  Filled 2017-09-07: qty 3

## 2017-09-07 NOTE — Progress Notes (Signed)
Daily Progress Note   Patient Name: Richard Mcpherson       Date: 09/07/2017 DOB: 1941/03/30  Age: 77 y.o. MRN#: [id number] Attending Physician: Vassie Loll, MD Primary Care Physician: Kirstie Peri, MD Admit Date: 09/06/2017  Reason for Consultation/Follow-up: Establishing goals of care and Psychosocial/spiritual support  Subjective: Richard Mcpherson is resting quietly in bed.  He looks in my direction, briefly making and keeping eye contact.  He is able to communicate basic needs, yes and no questions with eye blinks.  Present today at bedside his wife.    We again today talk about palliative medicine support. We talked about disposition briefly, Richard Mcpherson states that she does not want her husband to return to [facility].  We talked about long-term care facilities in [place].  Mrs. Scrogham asks if Richard Mcpherson has a facility.  I share that [facility]  She says he was there for a month, but "only had physical therapy on his legs 4 times".  At this point, continue full scope treatment.  I share that I will follow-up with them on Monday.   Length of Stay: 1  Current Medications: Scheduled Meds:  . allopurinol  100 mg Per Tube Once per day on Mon Wed Fri  . amantadine  100 mg Per Tube BID  . apixaban  2.5 mg Per Tube BID  . collagenase   Topical Daily  . famotidine  20 mg Per Tube BID  . ipratropium-albuterol  3 mL Nebulization Q6H  . metoprolol tartrate  37.5 mg Per Tube Daily  . modafinil  100 mg Per Tube Daily  . nutrition supplement (JUVEN)  1 packet Per Tube BID BM  . polyethylene glycol  17 g Per Tube Daily  . sertraline  50 mg Per Tube Daily  . tamsulosin  0.4 mg Oral Daily    Continuous Infusions: . feeding supplement (VITAL AF 1.2 CAL) 1,000 mL (09/07/17 1315)  .  piperacillin-tazobactam (ZOSYN)  IV Stopped (09/07/17 1321)  . vancomycin Stopped (09/07/17 0920)    PRN Meds: acetaminophen, antiseptic oral rinse, morphine injection  Physical Exam  Constitutional:  Appears frail, acutely/chronically ill, briefly makes but does not keep eye contact  HENT:  Head: Atraumatic.  Cardiovascular: Normal rate.  Rate low 100s at times  Pulmonary/Chest: No respiratory distress.  Janina Mayo,  Abdominal: Soft. He exhibits no distension.  PEG tube site  Musculoskeletal: He exhibits no edema.  Neurological: He is alert.  Eyes open, nonverbal.  Able to answer yes and no questions with eye blinks  Skin: Skin is warm and dry.  Multiple wounds as noted by nursing  Nursing note and vitals reviewed.           Vital Signs: BP (!) 105/58   Pulse (!) 105   Temp 100 F (37.8 C) (Axillary)   Resp (!) 32   Ht 6' (1.829 m)   Wt 73.5 kg (162 lb 0.6 oz)   SpO2 97%   BMI 21.98 kg/m  SpO2: SpO2: 97 % O2 Device: O2 Device: Tracheostomy Collar O2 Flow Rate: O2 Flow Rate (L/min): 8 L/min  Intake/output summary:   Intake/Output Summary (Last 24 hours) at 09/07/2017 1509 Last data filed at 09/07/2017 0500 Gross per 24 hour  Intake 366.25 ml  Output 900 ml  Net -533.75 ml   LBM: Last BM Date: 09/06/17 Baseline Weight: Weight: 84.4 kg (186 lb) Most recent weight: Weight: 73.5 kg (162 lb 0.6 oz)       Palliative Assessment/Data:      Patient Active Problem List   Diagnosis Date Noted  . HCAP (healthcare-associated pneumonia) 09/06/2017  . Acute respiratory failure with hypoxemia (HCC)   . Palliative care encounter   . Severe protein-calorie malnutrition (HCC) 08/21/2017  . Decubitus ulcer of sacral region, unstageable (HCC) 08/20/2017  . Staphylococcal sepsis (HCC) 08/20/2017  . Healthcare-associated pneumonia   . Acute on chronic respiratory failure with hypoxia (HCC) 08/19/2017  . Sepsis due to undetermined organism (HCC) 08/19/2017  . Aspiration  pneumonia of both lower lobes due to gastric secretions (HCC) 08/19/2017  . Tracheostomy status (HCC) 08/19/2017  . Deep vein thrombosis (DVT) of right upper extremity (HCC) 08/19/2017  . Lobar pneumonia (HCC) 08/19/2017  . Transaminasemia   . Sepsis (HCC) 08/18/2017  . Pneumonia 08/18/2017  . Acute hypoxemic respiratory failure (HCC) 08/18/2017  . 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 Stroke (cerebrum) (HCC) 04/18/2017  . SAH (subarachnoid hemorrhage) (HCC) 04/18/2017  . 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 Hyperlipidemia 03/25/2009  . Essential hypertension 03/25/2009  . CAD, NATIVE VESSEL 03/25/2009  . PALPITATIONS 03/25/2009    Palliative Care Assessment & Plan   Patient Profile: 77 y.o.malewith past medical history of cerebral aneurysm s/p coiling in July 2018 then CVA/SDH/cervical fusion/cardiac arrest/trach/PEG/LTAC in 06/2017. He was admitted on2/2/2019with fever the day after he transferred from [facility] to SNF. Initial workup revealed sepsis secondary to bacteremia and possible aspiration pneumonia. At SNF he was on trach collar at this time he is requiring ventilator support for respiratory failure.  Readmitted again 2-21.  Assessment: Sepsis secondary to healthcare associated pneumonia; treated with IV fluids and antibiotics.  Respiratory support. Multiple wounds; receiving tube feeding for nutrition, considering surgical debridement.  Recommendations/Plan:  At this point, full scope treatment.  Continue CODE STATUS discussions.  Continue to work toward disposition.  Goals of Care and Additional Recommendations:  Limitations on Scope of Treatment: Full Scope Treatment  Code Status:    Code Status Orders  (From admission, onward)        Start     Ordered   09/06/17 1801  Full code  Continuous     09/06/17 1809    Code Status History    Date Active Date Inactive Code Status Order ID  Comments User Context  08/18/2017 23:45 08/27/2017 17:21 Full Code [id number]  Erick Blinks, DO ED   07/20/2017 23:20 08/17/2017 17:15 Full Code [id number]  Ail[facility] Ards Inpatient       Prognosis:   < 6 months, would not be surprising based on functional status, multiple wounds, frailty.  Discharge Planning:  To Be Determined  Care plan was discussed with nursing staff, social worker, and Dr. Gwenlyn Perking on next rounds.  Thank you for allowing the Palliative Medicine Team to assist in the care of this patient.   Time In: 1455 Time Out: 1515 Total Time 20 Prolonged Time Billed  no       Greater than 50%  of this time was spent counseling and coordinating care related to the above assessment and plan.  Katheran Awe, NP  Please contact Palliative Medicine Team phone at [phone] for questions and concerns.

## 2017-09-07 NOTE — Care Management (Signed)
CM consulted for LTC.  Patient is a resident of [facility]. CSW consulted to arrange for return.  CM will sign off.

## 2017-09-07 NOTE — Consult Note (Signed)
WOC Nurse wound consult note  After speaking with organizational leaders, it was learned that an air fluidized bead bed cannot be brought in without going through the Patient Value Analysis Team vetting process.  Therefore, the order was cancelled, the company, MD, and primary RN was notified.  Spoke with Dr. Gwenlyn Perking about potential value of obtaining ABI by doppler study on lower extremities, especially the right leg.    In total that includes remote camera consultation, order entry, charting, coordination of care, and discussions with organizational leadership, a minimum of 1.5 hours was spent for care related consultation activities.   Thank you,  Helmut Muster MSN,RN,CWOCN,CNS-BC, [phone])

## 2017-09-07 NOTE — Progress Notes (Signed)
Initial Nutrition Assessment  DOCUMENTATION CODES:  Severe malnutrition in context of chronic illness  INTERVENTION:  Initiate TF via PEG with VITAL AF 1.2 at 55 and advance by 10 cc q4 hrs to goal rate of 85 ml/h (2040 ml per day)  to provide 2450 kcals, 153 gm protein, 1654 ml free water daily. Will need approximately 800 ccs from IVF support to meet needs.   Will order Juven supplement which contains CaHMB, Arginine, Glutamine and collagen to promote wound healing/tissue granulation.    Will monitor tolerance to TF/Labs  NUTRITION DIAGNOSIS:  Severe Malnutrition related to chronic illness(deficits from Severe CVA) as evidenced by severe muscle/fat wasting  GOAL:  Patient will meet greater than or equal to 90% of their needs  MONITOR:  Diet advancement, Weight trends, Labs, Skin  REASON FOR ASSESSMENT:  Consult Enteral/tube feeding initiation and management  ASSESSMENT:  76 y/o male PMHx CAD, Depression, HLD/HTN, Parkinsons, Gout, CVA s/p Trach/PEG, multiple decubitus ulcers, Severe Protein malnutrition. SNF resident. Presents w/ fever, hypoxia and increased secretions. Met sepsis criteria and CXR suggested PNA. Admitted for sepsis r/t HCAP. RD consulted for TF management.  Spoke with wife at bedside. She has papers that she brought from [facility]. Was able to see patient had been receiving isosource 1.5 tube feeding at rate of 55cc/hr. Number of hours he received it was not listed but wife says it was on 24/7. He also has prostat listed on sheet, but wife cannot confirm that this was consistently administered.   As written, his TF would have provided him with 2180 kcals, 120g Pro, 1008 ml.  Wife reports the PEG/Trach are both relatively new, being placed approximately 2 months ago. Patients CVA had occurred Dec 14th of 2017.   Weight wise, he has had significant decline over the past few months. He was 186.4 lbs in October. 179.4 in December. He was admitted to [facility] three weeks ago  at 165 lbs. Pt has lost a few more lbs since then, HOWEVER, Given the fact that his CVA only occurred 2 months ago, his weight loss may be more related to continued atrophy of muscles which has not yet plateaud, rather than insufficient TF. Feel a better indicator of whether or not he was meeting his needs would be if his wounds had improved or worsened, though wife reports the SNF did not turn the patient. Looking at WOC note from 2/3, pt appears to have had worsened wounds.   Physical Exam: Severe muscle wasting of gastrocnemius, quadriceps. Moderate muscle wasting of temporalis, deltoids and clavicular. Severe fat wasting to thorax. Severe swelling to BUE.   TF he had been supposedly receiving is close to meeting estimated needs. WIll change to fiber free formula given his critically ill state. Can transition to more generic TF formula if/when condition improves. Will also add juven for wound healing.   Labs: Albumin:2.2, Glu:118, wbc:15.5, Bun/Creat:37/.51 Meds: PPI, Prostat 30 ml BID, Miralax, IV abx,   Recent Labs  Lab 09/06/17 0840  NA 149*  K 3.6  CL 113*  CO2 25  BUN 37*  CREATININE 0.51*  CALCIUM 8.7*  GLUCOSE 118*   NUTRITION - FOCUSED PHYSICAL EXAM: See above.   Diet Order:  No diet orders on file  EDUCATION NEEDS:  No education needs have been identified at this time  Skin: 2x PU st II to toe on R foot, PU st II to mid-upper back,  PU st II to mid-lower back, PU st II to Left-Posterior shoulder,  Full   thickness/unstageable wounds to: R foot and sacrum. DTI to lower R leg.    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  Estimated Nutritional Needs:  Kcal:  2350-2550 kcals (32-35 kcal/kg bw) Protein:  125-145 g (1.5-2g/kg bw) Fluid:  >2.3 L (1 ml/kcal)  Burtis Junes RD, LDN, CNSC Clinical Nutrition Pager: [phone] 09/07/2017 11:32 AM

## 2017-09-07 NOTE — Progress Notes (Addendum)
[facility] PROGRESS NOTE  Richard Mcpherson:[id number] DOB: 09-Jul-1941 DOA: 09/06/2017 PCP: Kirstie Peri, MD  Interim summary and HPI 77 year old male with a past medical history significant for prior CVA currently on Eliquis, subsequent tracheostomy and PEG tube placement, hypertension, dyslipidemia, gout and severe protein malnutrition; who presented to the ED from nursing home secondary to increase tachypnea, oxygen desaturation, increased secretions out of his tracheostomy and elevated fever.   Assessment/Plan: 1-sepsis secondary to HCAP (healthcare-associated pneumonia) -remains in stepdown -continue oxygen supplementation and wean as tolerated -continue coverage for HCAP; patient on vanc and zosyn  -continue PRN duoneb -follow cx's  -IVF's per sepsis protocol given in ED; then kept over 15 hours on 75cc/hr; will change to 50cc/hr now (using d5 1/2 NS) -continue PRN antipyretics   2-Essential hypertension -BP is well controlled -continue current antihypertensive regimen   3-hx of Stroke (cerebrum) (HCC)/cervical fusion: -essentially non-mobile, non-verbal and bedridden  -will need long term SNF -CM and SW aware of admission -patient's wife declining going back to same facility   4-Acute on chronic respiratory failure with hypoxia (HCC) -most likely due to HCAP -patient trach dependent, coming from SNF and with recent hospital admission.  -continue oxygen supplementation and continue to wean as tolerated -continue current antibiotics and duoneb    5-multiple Decubitus ulcer of sacral region, unstageable and also affecting his upper back and Right heel -will follow rec's from wound care service -checking ABI (especially on RLE) -general surgery to assess for needs of debridement  6-hx of RUE DVT: -continue Eliquis   7-Severe protein-calorie malnutrition (HCC) -continue pro-stat -follow nutritional service rec's for tube feeding   8-hx of GOUT -continue  home allopurinol -no signs of flare currently   9-prior hx of parkinsonism  -continue amantadine   10-depression -continue zoloft  11-hx of BPH -will continue flomax  12-hypernatremia -will add free water and use d5 1/2 NS  Code Status: Full Family Communication: wife at bedside  Disposition Plan: remains in stepdown, continue IV antibiotics, check ABI, follow general surgery recommendations for need of debridement on sacral ulcer; continue duoneb.    Consultants:  Wound care consult  General surgery  Palliative care   Procedures:  See below for x-ray reports  Arterial duplex for ABI: pending   Antibiotics:  vanc and zosyn 09/06/17  HPI/Subjective: Still spiking fever, non-verbal; no using accessory muscles, but requiring 40% oxygen supplementation through trach.  Objective: Vitals:   09/07/17 1453 09/07/17 1454  BP:    Pulse:  (!) 105  Resp:  (!) 32  Temp:    SpO2: 97% 97%    Intake/Output Summary (Last 24 hours) at 09/07/2017 1541 Last data filed at 09/07/2017 0500 Gross per 24 hour  Intake 366.25 ml  Output 900 ml  Net -533.75 ml   Filed Weights   09/06/17 0825 09/06/17 1534 09/07/17 0500  Weight: 84.4 kg (186 lb) 74.6 kg (164 lb 7.4 oz) 73.5 kg (162 lb 0.6 oz)    Exam:   General:still spiking fever. Non-verbal or able to demonstrated ability to follow commands. Receiving 40% FIO2 through trach collar. No nausea, no vomiting.   Cardiovascular: mild tachycardia, S1 and s2, no rubs, no gallops  Respiratory: positive rhonchi, fair air movement. No using accessory muscles.  Abdomen: soft, NT, ND, positive BS  Musculoskeletal: no edema, multiple deep tissue injury and pressure injury affectine RLE, slight coldness appreciated on his legs.   Skin: stage 2 pressure injury in his upper back; unstageable decubitus ulcer and positive  deep tissue injury in his RLE. (all wounds present Prior to admission)  Data Reviewed: Basic Metabolic  Panel: Recent Labs  Lab 09/06/17 0840 09/07/17 1202  NA 149*  --   K 3.6  --   CL 113*  --   CO2 25  --   GLUCOSE 118*  --   BUN 37*  --   CREATININE 0.51*  --   CALCIUM 8.7*  --   MG  --  1.9  PHOS  --  3.6   Liver Function Tests: Recent Labs  Lab 09/06/17 0840  AST 37  ALT 57  ALKPHOS 301*  BILITOT 1.0  PROT 7.4  ALBUMIN 2.2*   CBC: Recent Labs  Lab 09/06/17 0840  WBC 15.5*  NEUTROABS 13.2  HGB 10.9*  HCT 37.9*  MCV 86.5  PLT 321   Cardiac Enzymes: Recent Labs  Lab 09/06/17 0840  TROPONINI 0.04*   BNP (last 3 results) Recent Labs    08/20/17 0405 09/06/17 0840  BNP 66.0 70.0   CBG: Recent Labs  Lab 09/07/17 1237  GLUCAP 82    Recent Results (from the past 240 hour(s))  Blood Culture (routine x 2)     Status: None (Preliminary result)   Collection Time: 09/06/17  8:42 AM  Result Value Ref Range Status   Specimen Description BLOOD BLOOD LEFT ARM DRAWN BY RN  Final   Special Requests   Final    BOTTLES DRAWN AEROBIC AND ANAEROBIC Blood Culture adequate volume   Culture   Final    NO GROWTH < 24 HOURS Performed at [facility], [address] [place], [place] [address]    Report Status PENDING  Incomplete  Blood Culture (routine x 2)     Status: None (Preliminary result)   Collection Time: 09/06/17  8:43 AM  Result Value Ref Range Status   Specimen Description BLOOD LEFT ANTECUBITAL DRAWN BY RN  Final   Special Requests   Final    BOTTLES DRAWN AEROBIC AND ANAEROBIC Blood Culture adequate volume   Culture   Final    NO GROWTH < 24 HOURS Performed at [facility], [address] [place], [place] [address]    Report Status PENDING  Incomplete  Culture, sputum-assessment     Status: None   Collection Time: 09/06/17  6:00 PM  Result Value Ref Range Status   Specimen Description EXPECTORATED SPUTUM  Final   Special Requests NONE  Final   Sputum evaluation   Final    THIS SPECIMEN IS ACCEPTABLE FOR SPUTUM CULTURE Performed at [facility], [address], [place], [place] [address]    Report Status 09/07/2017 FINAL  Final     Studies: Dg Chest Port 1 View  Result Date: 09/06/2017 CLINICAL DATA:  Shortness of breath, fever. EXAM: PORTABLE CHEST 1 VIEW COMPARISON:  Radiograph and CT scan of August 21, 2017. FINDINGS: Stable cardiomediastinal silhouette. Tracheostomy tube is in grossly good position. Atherosclerosis of thoracic aorta is noted. No pneumothorax is noted. Right lung is clear. Left basilar opacity is noted consistent with pneumonia. Bony thorax is unremarkable. IMPRESSION: Continued left basilar opacity is noted consistent with pneumonia. Associated pleural effusion cannot be excluded. Aortic Atherosclerosis (ICD10-I70.0). Electronically Signed   By: Lupita Raider, M.D.   On: 09/06/2017 08:59    Scheduled Meds: . allopurinol  100 mg Per Tube Once per day on Mon Wed Fri  . amantadine  100 mg Per Tube BID  . apixaban  2.5 mg Per  Tube BID  . collagenase   Topical Daily  . famotidine  20 mg Per Tube BID  . ipratropium-albuterol  3 mL Nebulization Q6H  . metoprolol tartrate  37.5 mg Per Tube Daily  . modafinil  100 mg Per Tube Daily  . nutrition supplement (JUVEN)  1 packet Per Tube BID BM  . polyethylene glycol  17 g Per Tube Daily  . sertraline  50 mg Per Tube Daily  . tamsulosin  0.4 mg Oral Daily   Continuous Infusions: . feeding supplement (VITAL AF 1.2 CAL) 1,000 mL (09/07/17 1315)  . piperacillin-tazobactam (ZOSYN)  IV Stopped (09/07/17 1321)  . vancomycin Stopped (09/07/17 0920)    Principal Problem:   HCAP (healthcare-associated pneumonia) Active Problems:   Essential hypertension   Stroke (cerebrum) (HCC)   Sepsis (HCC)   Acute on chronic respiratory failure with hypoxia (HCC)   Tracheostomy status (HCC)   Decubitus ulcer of sacral region, unstageable (HCC)   Severe protein-calorie malnutrition (HCC)   Palliative care by specialist   Counseling regarding advanced care planning and goals of  care    Time spent: 35 minutes (> 50% of the time intended for face to face and discussion with wife about findings, plan of care and interventions.    Vassie Loll  [facility] Pager [phone]. If 7PM-7AM, please contact night-coverage at [email or web address], password [facility] 09/07/2017, 3:41 PM  LOS: 1 day

## 2017-09-07 NOTE — Consult Note (Signed)
WOC Nurse wound consult note Reason for Consult: Pressure Ulcer Wound type: Pressure Ulcers Pressure Injury POA: Yes Measurement:  Please see flowsheet for patient.  All areas recorded in record per primary RNs.  A consult for wound management was performed remotely via camera with assistance of primary RNs Tiffany and Victorino Dike in the patient's room.  Patient's spouse was also present and taking pictures with a cell phone during wound evaluations.   Right foot, space between toes 4 and 5 with discoloration and open areas; no odor.  Apply betadine and place a 2 x 2 gauze between the toes, change daily.  Right 5th toes below the nail and to the lateral side of the foot at the 5th metatarsal area.  Discolored without odor or drainage.  Apply betadine to these areas and cover with a foam dressing.  Change daily.  Right lateral ankle:  Discolored, dry, no drainage, no odor.  Apply a foam dressing, monitor site daily, change dressing every 3 days and prn soilage.  Right lateral lower leg:  Eschar present in a linear configuration, no odor, no drainage.  Apply betadine and foam dressing daily.  Continue use of Prevalon boots to both feet; currently in place and in use.  Right forearm skin tear:  Monitor daily for s/s of infection.  If these develop, notify MD.  Apply a foam dressing to the site.  Change every 3 days and prn soilage.  Left scapula:  Open, pink, no odor, no drainage.  Apply a foam dressing, monitor area daily, change dressing every 3 days and prn soilage.  Mid-back along vertebrae:  Apply santyl and saline moistened gauze to area covered with yellow slough, cover with foam.  Perform daily.  Sacrum:  Unstageable, currently with a foam dressing.  Continue the foam dressing.  Patient needs a surgical consult for debridement.  Follow surgical site care instructions once debrided.  If additional skin tears develop, cleanse with normal saline and apply a foam dressing.  It may be  helpful to have a vascular study to determine blood flow to lower extremities as the RNs at the bedside report his right dorsalis pedis pulse if faint to palpation.  Patient would benefit from an air fluidized bed to provide the greatest pressure relief possible.  Material management contacted for bed order and replacement.  Monitor all wounds that are currently dry and without drainage or odor, for wound deterioration such as drainage, odor, erythema.  Should any of these symptoms develop consult WOC team and notify MD.  Discussed POC with patient, patient's spouse and bedside nurse. All questions of primary RNs and spouse answered to their expressed satisfaction.  Will follow. Thank you,  Helmut Muster MSN,RN,CWOCN,CNS-BC, [phone])

## 2017-09-08 LAB — BASIC METABOLIC PANEL
Anion gap: 10 (ref 5–15)
BUN: 42 mg/dL — AB (ref 6–20)
CALCIUM: 8.3 mg/dL — AB (ref 8.9–10.3)
CO2: 25 mmol/L (ref 22–32)
CREATININE: 0.3 mg/dL — AB (ref 0.61–1.24)
Chloride: 115 mmol/L — ABNORMAL HIGH (ref 101–111)
GFR calc Af Amer: >60 mL/min (ref >60)
GLUCOSE: 114 mg/dL — AB (ref 65–99)
Potassium: 2.7 mmol/L — CL (ref 3.5–5.1)
Sodium: 150 mmol/L — ABNORMAL HIGH (ref 135–145)

## 2017-09-08 LAB — CBC
HCT: 32.3 % — ABNORMAL LOW (ref 39.0–52.0)
Hemoglobin: 9.1 g/dL — ABNORMAL LOW (ref 13.0–17.0)
MCH: 24.7 pg — AB (ref 26.0–34.0)
MCHC: 28.2 g/dL — ABNORMAL LOW (ref 30.0–36.0)
MCV: 87.5 fL (ref 78.0–100.0)
Platelets: 235 10*3/uL (ref 150–400)
RBC: 3.69 MIL/uL — ABNORMAL LOW (ref 4.22–5.81)
RDW: 17.4 % — AB (ref 11.5–15.5)
WBC: 10 10*3/uL (ref 4.0–10.5)

## 2017-09-08 LAB — GLUCOSE, CAPILLARY
GLUCOSE-CAPILLARY: 110 mg/dL — AB (ref 65–99)
GLUCOSE-CAPILLARY: 112 mg/dL — AB (ref 65–99)
GLUCOSE-CAPILLARY: 119 mg/dL — AB (ref 65–99)
GLUCOSE-CAPILLARY: 141 mg/dL — AB (ref 65–99)
Glucose-Capillary: 102 mg/dL — ABNORMAL HIGH (ref 65–99)
Glucose-Capillary: 123 mg/dL — ABNORMAL HIGH (ref 65–99)

## 2017-09-08 LAB — PHOSPHORUS
PHOSPHORUS: 2.4 mg/dL — AB (ref 2.5–4.6)
Phosphorus: 2.7 mg/dL (ref 2.5–4.6)

## 2017-09-08 LAB — MAGNESIUM
MAGNESIUM: 1.8 mg/dL (ref 1.7–2.4)
Magnesium: 2 mg/dL (ref 1.7–2.4)

## 2017-09-08 MED ORDER — POTASSIUM CHLORIDE 20 MEQ/15ML (10%) PO SOLN
40.0000 meq | ORAL | Status: AC
  Administered 2017-09-08 (×4): 40 meq via ORAL
  Filled 2017-09-08 (×4): qty 30

## 2017-09-08 NOTE — Progress Notes (Signed)
[facility] PROGRESS NOTE  Richard Mcpherson:[id number] DOB: [birth year] DOA: 09/06/2017 PCP: Kirstie Peri, MD  Interim summary and HPI 77 year old male with a past medical history significant for prior CVA currently on Eliquis, subsequent tracheostomy and PEG tube placement, hypertension, dyslipidemia, gout and severe protein malnutrition; who presented to the ED from nursing home secondary to increase tachypnea, oxygen desaturation, increased secretions out of his tracheostomy and elevated fever.   Assessment/Plan: 1-sepsis secondary to HCAP (healthcare-associated pneumonia) -remains in stepdown; had low-grade fever overnight. -continue oxygen supplementation and wean as tolerated -Continue current antibiotic coverage (patient on vancomycin and Zosyn). -Continue as needed DuoNeb -follow cx's results -Sepsis features resolving.  Continue supportive care and current IV fluids. -continue PRN antipyretics   2-Essential hypertension -Continue current antihypertensive regimen.  3-hx of Stroke (cerebrum) (HCC)/cervical fusion: -essentially non-mobile, non-verbal and bedridden  -will need long term SNF -CM and SW aware of admission -patient's wife declining going back to same facility   -Patient able to track with his eyes and answering yes or no questions by blinking.  4-Acute on chronic respiratory failure with hypoxia (HCC) -most likely due to HCAP -patient trach dependent, coming from SNF and with recent hospital admission.  -continue oxygen supplementation and continue to wean as tolerated -continue current antibiotics and duoneb    5-multiple Decubitus ulcer of sacral region, unstageable and also affecting his upper back and Right heel -Continue to follow recommendations as part of preventive measures and care instructions by wound care services. -ABI (especially on RLE), demonstrating no significant arterial occlusive disease -Follow general surgery recommendations  regarding need for debridement is decubitus ulcer  6-hx of RUE DVT: -Continue Eliquis  7-Severe protein-calorie malnutrition (HCC) -Continue to feedings and continue pro-stat -Appreciate recommendations by nutritional service.  8-hx of GOUT -Continue home regimen of allopurinol (3 times a week) -No signs of acute flare.  9-prior hx of parkinsonism  -Continue amantadine  10-depression -Continue Zoloft  11-hx of BPH -will continue flomax  12-hypernatremia -Continue free water and D5 half-normal saline Repeat basic metabolic panel in a.m.  13-hypokalemia -Will replete and check magnesium level.  Code Status: Full Family Communication: wife at bedside  Disposition Plan: remains in stepdown, continue IV antibiotics, check ABI, follow general surgery recommendations for need of debridement on sacral ulcer; continue duoneb.    Consultants:  Wound care consult  General surgery  Palliative care   Procedures:  See below for x-ray reports  Arterial duplex for ABI: No evidence of hemodynamically significant lower extremity arterial occlusive disease at rest.   Antibiotics:  vanc and zosyn 09/06/17  HPI/Subjective: Low-grade fever overnight, nonverbal, able to track with his eyes and answering questions by blinking.  No chest pain, reports some discomfort in his back, no nausea vomiting.  Objective: Vitals:   09/08/17 0900 09/08/17 1012  BP: (!) 118/58   Pulse: 87   Resp: (!) 28   Temp:    SpO2: 96% 96%    Intake/Output Summary (Last 24 hours) at 09/08/2017 1046 Last data filed at 09/08/2017 0600 Gross per 24 hour  Intake 1690.42 ml  Output 500 ml  Net 1190.42 ml   Filed Weights   09/06/17 1534 09/07/17 0500 09/08/17 0400  Weight: 74.6 kg (164 lb 7.4 oz) 73.5 kg (162 lb 0.6 oz) 75.3 kg (166 lb 0.1 oz)    Exam:   General: With just low-grade temperature overnight.  Nonverbal but able to track with his eyes and communicate yes or no by blinking.   No  chest pain, no nausea, no vomiting.  Tolerating tube feedings.  FiO2 40% through trach collar and decrease on airway secretions.  Cardiovascular: Regular rate and rhythm, S1 and S2, no rubs, no gallops.   Respiratory: Improved air movement, scattered rhonchi, no using accessory muscles.  Abdomen: Soft, nontender, nondistended, positive bowel sounds.  Musculoskeletal: Unchanged examination of his extremities.  Same assessment as appreciated on 2/22: No edema, multiple deep tissue injury and pressure injury affectine RLE, slight coldness appreciated on his legs.   Skin: Unchanged examination of his skin, see below for details on assessment from 2/20-second.  All the wounds are now cover with Tegaderm and the patient is using floating boots to prevent breakdown on his heels.  Stage 2 pressure injury in his upper back; unstageable decubitus ulcer and positive deep tissue injury in his RLE. (all wounds present Prior to admission)  Data Reviewed: Basic Metabolic Panel: Recent Labs  Lab 09/06/17 0840 09/07/17 1202 09/07/17 1750 09/08/17 0442  NA 149*  --   --  150*  K 3.6  --   --  2.7*  CL 113*  --   --  115*  CO2 25  --   --  25  GLUCOSE 118*  --   --  114*  BUN 37*  --   --  42*  CREATININE 0.51*  --   --  0.30*  CALCIUM 8.7*  --   --  8.3*  MG  --  1.9 1.9 2.0  PHOS  --  3.6 2.5 2.7   Liver Function Tests: Recent Labs  Lab 09/06/17 0840  AST 37  ALT 57  ALKPHOS 301*  BILITOT 1.0  PROT 7.4  ALBUMIN 2.2*   CBC: Recent Labs  Lab 09/06/17 0840 09/08/17 0442  WBC 15.5* 10.0  NEUTROABS 13.2  --   HGB 10.9* 9.1*  HCT 37.9* 32.3*  MCV 86.5 87.5  PLT 321 235   Cardiac Enzymes: Recent Labs  Lab 09/06/17 0840  TROPONINI 0.04*   BNP (last 3 results) Recent Labs    08/20/17 0405 09/06/17 0840  BNP 66.0 70.0   CBG: Recent Labs  Lab 09/07/17 1237 09/07/17 1622 09/08/17 0021 09/08/17 0453 09/08/17 0828  GLUCAP 82 97 110* 102* 123*    Recent Results (from  the past 240 hour(s))  Blood Culture (routine x 2)     Status: None (Preliminary result)   Collection Time: 09/06/17  8:42 AM  Result Value Ref Range Status   Specimen Description BLOOD BLOOD LEFT ARM DRAWN BY RN  Final   Special Requests   Final    BOTTLES DRAWN AEROBIC AND ANAEROBIC Blood Culture adequate volume   Culture   Final    NO GROWTH 2 DAYS Performed at [facility], [address] [place], [place] [address]    Report Status PENDING  Incomplete  Blood Culture (routine x 2)     Status: None (Preliminary result)   Collection Time: 09/06/17  8:43 AM  Result Value Ref Range Status   Specimen Description BLOOD LEFT ANTECUBITAL DRAWN BY RN  Final   Special Requests   Final    BOTTLES DRAWN AEROBIC AND ANAEROBIC Blood Culture adequate volume   Culture   Final    NO GROWTH 2 DAYS Performed at [facility], [address], [place], [place] [address]    Report Status PENDING  Incomplete  Culture, sputum-assessment     Status: None   Collection Time: 09/06/17  6:00 PM  Result Value Ref  Range Status   Specimen Description EXPECTORATED SPUTUM  Final   Special Requests NONE  Final   Sputum evaluation   Final    THIS SPECIMEN IS ACCEPTABLE FOR SPUTUM CULTURE Performed at [facility], [address], [place], [place] [address]    Report Status 09/07/2017 FINAL  Final  Culture, respiratory (NON-Expectorated)     Status: None (Preliminary result)   Collection Time: 09/06/17  6:00 PM  Result Value Ref Range Status   Specimen Description   Final    EXPECTORATED SPUTUM Performed at [facility], [address], [place], [place] [address]    Special Requests   Final    NONE Reflexed from [id number] Performed at [facility], [address], [place], [place] [address]    Gram Stain   Final    ABUNDANT WBC PRESENT, PREDOMINANTLY PMN ABUNDANT GRAM POSITIVE RODS    Culture   Final    CULTURE REINCUBATED FOR BETTER GROWTH Performed at [facility], [address], [place],  [place] [address]    Report Status PENDING  Incomplete     Studies: US Arterial Vanice Sarah (screening Lower Extremity)  Result Date: 09/07/2017 CLINICAL DATA:  Bilateral lower extremity ulcerations, previous tobacco abuse, hypertension, hyperlipidemia EXAM: NONINVASIVE PHYSIOLOGIC VASCULAR STUDY OF BILATERAL LOWER EXTREMITIES TECHNIQUE: Evaluation of both lower extremities were performed at rest, including calculation of ankle-brachial indices with single level Doppler, pressure recording. COMPARISON:  None. FINDINGS: Right ABI:  1.0 Left ABI:  1.21 Right Lower Extremity: Monophasic arterial waveforms are recorded distally. Left Lower Extremity:  Monophasic waveforms recorded distally IMPRESSION: No evidence of hemodynamically significant lower extremity arterial occlusive disease at rest. Electronically Signed   By: Corlis Leak M.D.   On: 09/07/2017 16:57    Scheduled Meds: . allopurinol  100 mg Per Tube Once per day on Mon Wed Fri  . amantadine  100 mg Per Tube BID  . apixaban  2.5 mg Per Tube BID  . chlorhexidine  15 mL Mouth Rinse BID  . collagenase   Topical Daily  . famotidine  20 mg Per Tube BID  . free water  100 mL Per Tube Q8H  . ipratropium-albuterol  3 mL Nebulization Q6H  . mouth rinse  15 mL Mouth Rinse q12n4p  . metoprolol tartrate  37.5 mg Per Tube Daily  . modafinil  100 mg Per Tube Daily  . nutrition supplement (JUVEN)  1 packet Per Tube BID BM  . polyethylene glycol  17 g Per Tube Daily  . potassium chloride  40 mEq Oral Q4H  . sertraline  50 mg Per Tube Daily  . tamsulosin  0.4 mg Oral Daily   Continuous Infusions: . dextrose 5 % and 0.45% NaCl 50 mL/hr at 09/08/17 0600  . feeding supplement (VITAL AF 1.2 CAL) 1,000 mL (09/08/17 0930)  . piperacillin-tazobactam (ZOSYN)  IV 3.375 g (09/08/17 0934)  . vancomycin Stopped (09/08/17 0901)    Principal Problem:   HCAP (healthcare-associated pneumonia) Active Problems:   Essential hypertension   Stroke (cerebrum) (HCC)   Sepsis  (HCC)   Acute on chronic respiratory failure with hypoxia (HCC)   Tracheostomy status (HCC)   Decubitus ulcer of sacral region, unstageable (HCC)   Severe protein-calorie malnutrition (HCC)   Palliative care by specialist   Counseling regarding advanced care planning and goals of care    Time spent: 35 minutes (> 50% of the time intended for face to face and discussion with wife about findings, plan of care and  interventions.    Vassie Loll  [facility] Pager [phone]. If 7PM-7AM, please contact night-coverage at [email or web address], password [email or web address] 09/08/2017, 10:46 AM  LOS: 2 days

## 2017-09-09 LAB — BASIC METABOLIC PANEL
Anion gap: 9 (ref 5–15)
BUN: 39 mg/dL — AB (ref 6–20)
CHLORIDE: 117 mmol/L — AB (ref 101–111)
CO2: 23 mmol/L (ref 22–32)
CREATININE: 0.33 mg/dL — AB (ref 0.61–1.24)
Calcium: 8.2 mg/dL — ABNORMAL LOW (ref 8.9–10.3)
GFR calc Af Amer: >60 mL/min (ref >60)
GLUCOSE: 117 mg/dL — AB (ref 65–99)
Potassium: 3.8 mmol/L (ref 3.5–5.1)
SODIUM: 149 mmol/L — AB (ref 135–145)

## 2017-09-09 LAB — CULTURE, RESPIRATORY: CULTURE: NORMAL

## 2017-09-09 LAB — GLUCOSE, CAPILLARY
GLUCOSE-CAPILLARY: 110 mg/dL — AB (ref 65–99)
Glucose-Capillary: 106 mg/dL — ABNORMAL HIGH (ref 65–99)
Glucose-Capillary: 99 mg/dL (ref 65–99)
Glucose-Capillary: 123 mg/dL — ABNORMAL HIGH (ref 65–99)
Glucose-Capillary: 106 mg/dL — ABNORMAL HIGH (ref 65–99)
Glucose-Capillary: 116 mg/dL — ABNORMAL HIGH (ref 65–99)

## 2017-09-09 LAB — CULTURE, RESPIRATORY W GRAM STAIN

## 2017-09-09 MED ORDER — COLLAGENASE 250 UNIT/GM EX OINT
TOPICAL_OINTMENT | Freq: Two times a day (BID) | CUTANEOUS | Status: DC
  Administered 2017-09-09 – 2017-09-11 (×5): via TOPICAL
  Filled 2017-09-09: qty 30

## 2017-09-09 NOTE — Progress Notes (Signed)
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   Assessment/Plan: 1-sepsis secondary to HCAP (healthcare-associated pneumonia) -remains in stepdown; very low-grade fever overnight. -continue oxygen supplementation and continue to wean as tolerated -Continue current antibiotic coverage (patient on vancomycin and Zosyn). Will look for 24 hours w/o temp before transition to PO.  -Continue as needed DuoNeb -follow cx's results; so far no growth. -Sepsis features resolving.  Continue supportive care and current IV fluids. -continue PRN antipyretics   2-Essential hypertension -Continue current antihypertensive regimen.  3-hx of Stroke (cerebrum) (HCC)/cervical fusion: -essentially non-mobile, non-verbal and bedridden  -will need long term SNF -CM and SW aware of admission -patient's wife declining going back to same facility   -Patient able to track with his eyes and answering yes or no questions by blinking. Also was trying to articulate some words (even words were non-understandable)  4-Acute on chronic respiratory failure with hypoxia (HCC) -most likely due to HCAP -patient trach dependent, coming from SNF and with recent hospital admission.  -continue oxygen supplementation and continue to wean as tolerated -continue current antibiotics and duoneb    5-multiple Decubitus ulcer of sacral region, unstageable and also affecting his upper back and Right heel -Continue to follow recommendations as part of preventive measures and  care instructions by wound care services. -ABI (especially on RLE), demonstrating no significant arterial occlusive disease -will Follow general surgery recommendations regarding need for debridement is decubitus ulcer  6-hx of RUE DVT: -continue eliquis   7-Severe protein-calorie malnutrition (HCC) -Continue to feedings and continue pro-stat -Appreciate recommendations by nutritional service. -continue free water  8-hx of GOUT -Continue home regimen of allopurinol (3 times a week) -No signs of acute flare.  9-prior hx of parkinsonism  -continue amantadine   10-depression -continue zoloft   11-hx of BPH -continue flomax  12-hypernatremia -Continue free water and D5 half-normal saline -trending down (now 149)  13-hypokalemia -repleted and stable now -will continue to monitor intermittently  -Mg 1.8  Code Status: Full Family Communication: wife at bedside  Disposition Plan: remains in stepdown, continue IV antibiotics, check ABI, follow general surgery recommendations for need of debridement on sacral ulcer; continue duoneb.    Consultants:  Wound care consult  General surgery  Palliative care   Procedures:  See below for x-ray reports  Arterial duplex for ABI: No evidence of hemodynamically significant lower extremity arterial occlusive disease at rest.   Antibiotics:  vanc and zosyn 09/06/17  HPI/Subjective: No CP, no major resp distress appreciated. TMAX 100.1. tolerating tube feedings and just expressing some pain in his back.  Objective: Vitals:   09/09/17 0858 09/09/17 0900  BP:  (!) 109/59  Pulse:  89  Resp:  (!) 30  Temp: 98.1 F (36.7 C)   SpO2: 95% 95%    Intake/Output Summary (Last 24 hours) at 09/09/2017 1002 Last data filed at 09/08/2017 1700 Gross per 24 hour  Intake 550 ml  Output 800 ml  Net -250 ml   Filed Weights   09/07/17 0500 09/08/17 0400 09/09/17 0500  Weight: 73.5 kg (162 lb 0.6 oz) 75.3 kg (166 lb 0.1 oz) 73.5  kg (  162 lb 0.6 oz)    Exam:   General: TMAX 100.1; no CP, breathing better and alert. Patient tracking, answering questions with blinking and also trying to articulate words. Receiving 30-40% FIo@ through trach collar.   Cardiovascular: Regular rate and rhythm, S1 and S2, no rubs, no gallops.   Respiratory: good air movement, positive rhonchi, no wheezing or crackles.   Abdomen: soft, NT, ND, positive BS  Musculoskeletal: no edema, no cyanosis, both legs feeling warmer. Continue to have multiple pressure injury and deep tissue injury affecting RLE. No wounds seen on his LLE. Patient unable to move extremities.   Skin: Unchanged examination of his skin, see below for details on assessment from 2/22-2/23.  All the wounds are now cover with Tegaderm and the patient is using floating boots to prevent breakdown on his heels.  Stage 2 pressure injury in his upper back; unstageable decubitus ulcer and positive deep tissue injury in his RLE. (all wounds present Prior to admission). Sacrum wound with some yellow slough and black discoloration.  Data Reviewed: Basic Metabolic Panel: Recent Labs  Lab 09/06/17 0840 09/07/17 1202 09/07/17 1750 09/08/17 0442 09/08/17 1719 09/09/17 0414  NA 149*  --   --  150*  --  149*  K 3.6  --   --  2.7*  --  3.8  CL 113*  --   --  115*  --  117*  CO2 25  --   --  25  --  23  GLUCOSE 118*  --   --  114*  --  117*  BUN 37*  --   --  42*  --  39*  CREATININE 0.51*  --   --  0.30*  --  0.33*  CALCIUM 8.7*  --   --  8.3*  --  8.2*  MG  --  1.9 1.9 2.0 1.8  --   PHOS  --  3.6 2.5 2.7 2.4*  --    Liver Function Tests: Recent Labs  Lab 09/06/17 0840  AST 37  ALT 57  ALKPHOS 301*  BILITOT 1.0  PROT 7.4  ALBUMIN 2.2*   CBC: Recent Labs  Lab 09/06/17 0840 09/08/17 0442  WBC 15.5* 10.0  NEUTROABS 13.2  --   HGB 10.9* 9.1*  HCT 37.9* 32.3*  MCV 86.5 87.5  PLT 321 235   Cardiac Enzymes: Recent Labs  Lab 09/06/17 0840  TROPONINI 0.04*   BNP  (last 3 results) Recent Labs    08/20/17 0405 09/06/17 0840  BNP 66.0 70.0   CBG: Recent Labs  Lab 09/08/17 1754 09/08/17 1955 09/09/17 0125 09/09/17 0447 09/09/17 0810  GLUCAP 119* 112* 106* 99 123*    Recent Results (from the past 240 hour(s))  Blood Culture (routine x 2)     Status: None (Preliminary result)   Collection Time: 09/06/17  8:42 AM  Result Value Ref Range Status   Specimen Description BLOOD BLOOD LEFT ARM DRAWN BY RN  Final   Special Requests   Final    BOTTLES DRAWN AEROBIC AND ANAEROBIC Blood Culture adequate volume   Culture   Final    NO GROWTH 3 DAYS Performed at [facility], [address] [place], [place] [address]    Report Status PENDING  Incomplete  Blood Culture (routine x 2)     Status: None (Preliminary result)   Collection Time: 09/06/17  8:43 AM  Result Value Ref Range Status   Specimen Description BLOOD LEFT ANTECUBITAL DRAWN BY RN  Final  Special Requests   Final    BOTTLES DRAWN AEROBIC AND ANAEROBIC Blood Culture adequate volume   Culture   Final    NO GROWTH 3 DAYS Performed at [facility], [address] [place], [place] [address]    Report Status PENDING  Incomplete  Culture, sputum-assessment     Status: None   Collection Time: 09/06/17  6:00 PM  Result Value Ref Range Status   Specimen Description EXPECTORATED SPUTUM  Final   Special Requests NONE  Final   Sputum evaluation   Final    THIS SPECIMEN IS ACCEPTABLE FOR SPUTUM CULTURE Performed at [facility], [address], [place], [place] [address]    Report Status 09/07/2017 FINAL  Final  Culture, respiratory (NON-Expectorated)     Status: None (Preliminary result)   Collection Time: 09/06/17  6:00 PM  Result Value Ref Range Status   Specimen Description   Final    EXPECTORATED SPUTUM Performed at [facility], [address] [place], [place] [address]    Special Requests   Final    NONE Reflexed from [id number] Performed at [facility], [address]  [place], [place] [address]    Gram Stain   Final    ABUNDANT WBC PRESENT, PREDOMINANTLY PMN ABUNDANT GRAM POSITIVE RODS    Culture   Final    CULTURE REINCUBATED FOR BETTER GROWTH Performed at [facility], [address] [place], [place] [address]    Report Status PENDING  Incomplete     Studies: US Arterial Vanice Sarah (screening Lower Extremity)  Result Date: 09/07/2017 CLINICAL DATA:  Bilateral lower extremity ulcerations, previous tobacco abuse, hypertension, hyperlipidemia EXAM: NONINVASIVE PHYSIOLOGIC VASCULAR STUDY OF BILATERAL LOWER EXTREMITIES TECHNIQUE: Evaluation of both lower extremities were performed at rest, including calculation of ankle-brachial indices with single level Doppler, pressure recording. COMPARISON:  None. FINDINGS: Right ABI:  1.0 Left ABI:  1.21 Right Lower Extremity: Monophasic arterial waveforms are recorded distally. Left Lower Extremity:  Monophasic waveforms recorded distally IMPRESSION: No evidence of hemodynamically significant lower extremity arterial occlusive disease at rest. Electronically Signed   By: Corlis Leak M.D.   On: 09/07/2017 16:57    Scheduled Meds: . allopurinol  100 mg Per Tube Once per day on Mon Wed Fri  . amantadine  100 mg Per Tube BID  . apixaban  2.5 mg Per Tube BID  . chlorhexidine  15 mL Mouth Rinse BID  . collagenase   Topical Daily  . famotidine  20 mg Per Tube BID  . free water  100 mL Per Tube Q8H  . ipratropium-albuterol  3 mL Nebulization Q6H  . mouth rinse  15 mL Mouth Rinse q12n4p  . metoprolol tartrate  37.5 mg Per Tube Daily  . modafinil  100 mg Per Tube Daily  . nutrition supplement (JUVEN)  1 packet Per Tube BID BM  . polyethylene glycol  17 g Per Tube Daily  . sertraline  50 mg Per Tube Daily  . tamsulosin  0.4 mg Oral Daily   Continuous Infusions: . dextrose 5 % and 0.45% NaCl 50 mL/hr at 09/08/17 0600  . feeding supplement (VITAL AF 1.2 CAL) 1,000 mL (09/08/17 0930)  . piperacillin-tazobactam (ZOSYN)  IV  3.375 g (09/09/17 1000)  . vancomycin 1,000 mg (09/09/17 0957)    Principal Problem:   HCAP (healthcare-associated pneumonia) Active Problems:   Essential hypertension   Stroke (cerebrum) (HCC)   Sepsis (HCC)   Acute on chronic respiratory failure with hypoxia (HCC)  Tracheostomy status (HCC)   Decubitus ulcer of sacral region, unstageable (HCC)   Severe protein-calorie malnutrition (HCC)   Palliative care by specialist   Counseling regarding advanced care planning and goals of care    Time spent: 35 minutes (> 50% of the time intended for face to face and discussion with wife about findings, plan of care and interventions.    Vassie Loll  [facility] Pager [phone]. If 7PM-7AM, please contact night-coverage at [email or web address], password [facility] 09/09/2017, 10:02 AM  LOS: 3 days

## 2017-09-09 NOTE — Consult Note (Signed)
[place]  Reason for Consult: Sacral ulcer, unstageable  Referring Physician: Dr. Dyann Kief   Chief Complaint    Respiratory Distress      Richard Mcpherson is a 77 y.o. male.  HPI: Richard Mcpherson is a 77 yo unfortunate gentleman with a history of CVA on Eliquis, tracheostomy, PEG tube placement, HTN, dyslipidemia, protein malnutrition who presented to the ED with desaturations, increased secretions from his tracheostomy and was diagnosed with HCAP.    He has been at a SNF and was noted to have multiple decubitus wounds that the wound RN have seen including a unstageable sacral wound.   Wife not at bedside but brother at bedside.   Past Medical History:  Diagnosis Date  . Atrial premature beats   . Blood in stool   . BPH (benign prostatic hyperplasia)   . Cerebral aneurysm   . Coronary atherosclerosis of native coronary artery   . Depression   . Embolism ([place])    History of right lower extremity distal embolism   . Gout   . Hyperglycemia   . Hyperlipidemia   . Hyperlipidemia, mixed   . Osteoarthritis   . Parkinson disease ([place])   . Plantar fasciitis   . Pneumonia   . Pressure ulcer of sacral region   . Protein calorie malnutrition ([place])   . Sepsis ([place])   . Sinusitis   . Stroke ([place])   . Subarachnoid hemorrhage ([place])   . Tracheostomy dependent ([place])   . Unspecified essential hypertension     Past Surgical History:  Procedure Laterality Date  . APPENDECTOMY    . cardiac catherization    . CATARACT EXTRACTION W/PHACO Right 02/19/2014   Procedure: CATARACT EXTRACTION PHACO AND INTRAOCULAR LENS PLACEMENT (IOC);  Surgeon: Tonny Branch, MD;  Location: [facility];  Service: Ophthalmology;  Laterality: Right;  CDE 12.71  . CATARACT EXTRACTION W/PHACO Left 12/28/2014   Procedure: CATARACT EXTRACTION PHACO AND INTRAOCULAR LENS PLACEMENT (IOC);  Surgeon: Tonny Branch, MD;  Location: [facility];  Service: Ophthalmology;  Laterality: Left;  CDE:8.05  . embolectomy 2000      . IR GASTROSTOMY TUBE MOD SED  08/06/2017    Family History  Problem Relation Age of Onset  . Cancer Mother     Social History   Tobacco Use  . Smoking status: Former Smoker    Packs/day: 1.00    Years: 40.00    Pack years: 40.00  . Smokeless tobacco: Current User    Types: Snuff  Substance Use Topics  . Alcohol use: Yes    Comment: 1-2 beers every other day   . Drug use: No    Medications:  I have reviewed the patient's current medications. Prior to Admission:  Medications Prior to Admission  Medication Sig Dispense Refill Last Dose  . allopurinol (ZYLOPRIM) 100 MG tablet Place 100 mg into feeding tube 3 (three) times a week. Every Tuesday, Thursday, Sunday   09/04/2017  . amantadine (SYMMETREL) 100 MG capsule Place 100 mg into feeding tube 2 (two) times daily.   09/05/2017 at Unknown time  . Amino Acids-Protein Hydrolys (FEEDING SUPPLEMENT, PRO-STAT SUGAR FREE 64,) LIQD Take 30 mLs by mouth 2 (two) times daily with a meal. For wound healing.   09/05/2017 at Unknown time  . antiseptic oral rinse (BIOTENE) LIQD 5 mLs by Mouth Rinse route as needed for dry mouth.   09/05/2017 at Unknown time  . apixaban (ELIQUIS) 2.5 MG TABS tablet Place 2.5 mg into feeding tube 2 (two)  times daily.   09/05/2017 at 1700  . collagenase (SANTYL) ointment Apply 1 application topically daily. Right buttocks area   09/05/2017 at Unknown time  . famotidine (PEPCID) 20 MG tablet Place 20 mg into feeding tube 2 (two) times daily.   09/05/2017 at Unknown time  . Melatonin 3 MG TABS Take 1 tablet by mouth at bedtime.   09/05/2017 at Unknown time  . Metoprolol Tartrate 37.5 MG TABS Place 1 tablet into feeding tube daily.   09/05/2017 at Unknown time  . modafinil (PROVIGIL) 100 MG tablet Take 100 mg by mouth daily. Via G-tube for sleep   08/27/2017  . Omega-3 Fatty Acids (FISH OIL) 1000 MG CAPS Place 1 capsule into feeding tube daily.   09/05/2017 at Unknown time  . polyethylene glycol powder (GLYCOLAX/MIRALAX)  powder Take 17 g by mouth daily.   09/05/2017 at Unknown time  . sertraline (ZOLOFT) 50 MG tablet Place 50 mg into feeding tube daily.   09/05/2017 at Unknown time  . tamsulosin (FLOMAX) 0.4 MG CAPS capsule 0.4 mg daily. Per G-Tube   09/05/2017 at Unknown time   Scheduled: . allopurinol  100 mg Per Tube Once per day on Mon Wed Fri  . amantadine  100 mg Per Tube BID  . apixaban  2.5 mg Per Tube BID  . chlorhexidine  15 mL Mouth Rinse BID  . collagenase   Topical Daily  . collagenase   Topical BID  . famotidine  20 mg Per Tube BID  . free water  100 mL Per Tube Q8H  . ipratropium-albuterol  3 mL Nebulization Q6H  . mouth rinse  15 mL Mouth Rinse q12n4p  . metoprolol tartrate  37.5 mg Per Tube Daily  . modafinil  100 mg Per Tube Daily  . nutrition supplement (JUVEN)  1 packet Per Tube BID BM  . polyethylene glycol  17 g Per Tube Daily  . sertraline  50 mg Per Tube Daily  . tamsulosin  0.4 mg Oral Daily   Continuous: . dextrose 5 % and 0.45% NaCl 50 mL/hr at 09/09/17 1005  . feeding supplement (VITAL AF 1.2 CAL) 1,000 mL (09/08/17 0930)  . piperacillin-tazobactam (ZOSYN)  IV 3.375 g (09/09/17 1000)  . vancomycin 1,000 mg (09/09/17 0957)   SHU:[id number], antiseptic oral rinse, morphine injection  Allergies: Allergies  Allergen Reactions  . Codeine Nausea Only and Other (See Comments)  . Crestor [Rosuvastatin Calcium] Other (See Comments)    bodyache  . Lipitor [Atorvastatin] Other (See Comments)    Body aches  . Statins   . Allopurinol Rash    ROS:  unable to obtain as patient has tracheostomy and is unable to communicate  Blood pressure (!) 109/59, pulse 89, temperature 98.1 F (36.7 C), temperature source Axillary, resp. rate (!) 30, height 6' (1.829 m), weight 162 lb 0.6 oz (73.5 kg), SpO2 95 %. Physical Exam  Constitutional: He appears malnourished. He appears cachectic.  HENT:  Head: Normocephalic.  Eyes: Pupils are equal, round, and reactive to light.   Cardiovascular: Normal rate.  Pulmonary/Chest: Effort normal.  Scapula areas with stage I pressure sores, vertebra with stage III wound, lower extremity with pressure sores  Abdominal: Soft. He exhibits no distension. There is no tenderness.  Genitourinary:  Genitourinary Comments: Sacral wound with black eschar 7X5.5cm, mostly dry but some bogginess in the superior and left lateral portion, no drainage, some fibrinous material centrally, eschar cleared over boggy aspect, down to fatty tissue, some minimal bleeding but no drainage  Neurological: He is alert.  Skin: Skin is warm.  Pre bedside debridement:  Post bedside debridement:     Results: Results for orders placed or performed during the hospital encounter of 09/06/17 (from the past 48 hour(s))  Glucose, capillary     Status: None   Collection Time: 09/07/17 12:37 PM  Result Value Ref Range   Glucose-Capillary 82 65 - 99 mg/dL   Comment 1 Notify RN    Comment 2 Document in Chart   Glucose, capillary     Status: None   Collection Time: 09/07/17  4:22 PM  Result Value Ref Range   Glucose-Capillary 97 65 - 99 mg/dL   Comment 1 Notify RN    Comment 2 Document in Chart   Magnesium     Status: None   Collection Time: 09/07/17  5:50 PM  Result Value Ref Range   Magnesium 1.9 1.7 - 2.4 mg/dL    Comment: Performed at [facility], [address] [place], [place] [address]  Phosphorus     Status: None   Collection Time: 09/07/17  5:50 PM  Result Value Ref Range   Phosphorus 2.5 2.5 - 4.6 mg/dL    Comment: Performed at [facility], [address], [place], [place] [address]  Glucose, capillary     Status: Abnormal   Collection Time: 09/08/17 12:21 AM  Result Value Ref Range   Glucose-Capillary 110 (H) 65 - 99 mg/dL   Comment 1 Notify RN    Comment 2 Document in Chart   Magnesium     Status: None   Collection Time: 09/08/17  4:42 AM  Result Value Ref Range   Magnesium 2.0 1.7 - 2.4 mg/dL    Comment: Performed at [facility], [address], [place], [place] [address]  Phosphorus     Status: None   Collection Time: 09/08/17  4:42 AM  Result Value Ref Range   Phosphorus 2.7 2.5 - 4.6 mg/dL    Comment: Performed at [facility], [address] [place], [place] [address]  CBC     Status: Abnormal   Collection Time: 09/08/17  4:42 AM  Result Value Ref Range   WBC 10.0 4.0 - 10.5 K/uL   RBC 3.69 (L) 4.22 - 5.81 MIL/uL   Hemoglobin 9.1 (L) 13.0 - 17.0 g/dL   HCT 32.3 (L) 39.0 - 52.0 %   MCV 87.5 78.0 - 100.0 fL   MCH 24.7 (L) 26.0 - 34.0 pg   MCHC 28.2 (L) 30.0 - 36.0 g/dL   RDW 17.4 (H) 11.5 - 15.5 %   Platelets 235 150 - 400 K/uL    Comment: Performed at [facility], [address], [place], [place] [address]  Basic metabolic panel     Status: Abnormal   Collection Time: 09/08/17  4:42 AM  Result Value Ref Range   Sodium 150 (H) 135 - 145 mmol/L   Potassium 2.7 (LL) 3.5 - 5.1 mmol/L    Comment: CRITICAL RESULT CALLED TO, READ BACK BY AND VERIFIED WITH: ROBERTS. @ 0726 ON [id number] BY HENDERSON L.    Chloride 115 (H) 101 - 111 mmol/L   CO2 25 22 - 32 mmol/L   Glucose, Bld 114 (H) 65 - 99 mg/dL   BUN 42 (H) 6 - 20 mg/dL   Creatinine, Ser 0.30 (L) 0.61 - 1.24 mg/dL   Calcium 8.3 (L) 8.9 - 10.3 mg/dL   GFR calc non Af Amer >60 >60 mL/min   GFR calc Af Amer >60 >  60 mL/min    Comment: (NOTE) The eGFR has been calculated using the CKD EPI equation. This calculation has not been validated in all clinical situations. eGFR's persistently <60 mL/min signify possible Chronic Kidney Disease.    Anion gap 10 5 - 15    Comment: Performed at [facility], [address] [place], [place] [address]  Glucose, capillary     Status: Abnormal   Collection Time: 09/08/17  4:53 AM  Result Value Ref Range   Glucose-Capillary 102 (H) 65 - 99 mg/dL   Comment 1 Notify RN    Comment 2 Document in Chart   Glucose, capillary     Status: Abnormal   Collection Time: 09/08/17  8:28 AM  Result Value Ref Range    Glucose-Capillary 123 (H) 65 - 99 mg/dL  Glucose, capillary     Status: Abnormal   Collection Time: 09/08/17 11:53 AM  Result Value Ref Range   Glucose-Capillary 141 (H) 65 - 99 mg/dL  Magnesium     Status: None   Collection Time: 09/08/17  5:19 PM  Result Value Ref Range   Magnesium 1.8 1.7 - 2.4 mg/dL    Comment: Performed at [facility], [address] [place], [place] [address]  Phosphorus     Status: Abnormal   Collection Time: 09/08/17  5:19 PM  Result Value Ref Range   Phosphorus 2.4 (L) 2.5 - 4.6 mg/dL    Comment: Performed at [facility], [address] [place], [place] [address]  Glucose, capillary     Status: Abnormal   Collection Time: 09/08/17  5:54 PM  Result Value Ref Range   Glucose-Capillary 119 (H) 65 - 99 mg/dL  Glucose, capillary     Status: Abnormal   Collection Time: 09/08/17  7:55 PM  Result Value Ref Range   Glucose-Capillary 112 (H) 65 - 99 mg/dL  Glucose, capillary     Status: Abnormal   Collection Time: 09/09/17  1:25 AM  Result Value Ref Range   Glucose-Capillary 106 (H) 65 - 99 mg/dL  Basic metabolic panel     Status: Abnormal   Collection Time: 09/09/17  4:14 AM  Result Value Ref Range   Sodium 149 (H) 135 - 145 mmol/L   Potassium 3.8 3.5 - 5.1 mmol/L    Comment: DELTA CHECK NOTED   Chloride 117 (H) 101 - 111 mmol/L   CO2 23 22 - 32 mmol/L   Glucose, Bld 117 (H) 65 - 99 mg/dL   BUN 39 (H) 6 - 20 mg/dL   Creatinine, Ser 0.33 (L) 0.61 - 1.24 mg/dL   Calcium 8.2 (L) 8.9 - 10.3 mg/dL   GFR calc non Af Amer >60 >60 mL/min   GFR calc Af Amer >60 >60 mL/min    Comment: (NOTE) The eGFR has been calculated using the CKD EPI equation. This calculation has not been validated in all clinical situations. eGFR's persistently <60 mL/min signify possible Chronic Kidney Disease.    Anion gap 9 5 - 15    Comment: Performed at [facility], [address] , [place] [address]  Glucose, capillary     Status: None   Collection Time: 09/09/17  4:47 AM   Result Value Ref Range   Glucose-Capillary 99 65 - 99 mg/dL  Glucose, capillary     Status: Abnormal   Collection Time: 09/09/17  8:10 AM  Result Value Ref Range   Glucose-Capillary 123 (H) 65 - 99 mg/dL    Us Arterial Abi (screening Lower Extremity)    Result Date: 09/07/2017 CLINICAL DATA:  Bilateral lower extremity ulcerations, previous tobacco abuse, hypertension, hyperlipidemia EXAM: NONINVASIVE PHYSIOLOGIC VASCULAR STUDY OF BILATERAL LOWER EXTREMITIES TECHNIQUE: Evaluation of both lower extremities were performed at rest, including calculation of ankle-brachial indices with single level Doppler, pressure recording. COMPARISON:  None. FINDINGS: Right ABI:  1.0 Left ABI:  1.21 Right Lower Extremity: Monophasic arterial waveforms are recorded distally. Left Lower Extremity:  Monophasic waveforms recorded distally IMPRESSION: No evidence of hemodynamically significant lower extremity arterial occlusive disease at rest. Electronically Signed   By: D  Hassell M.D.   On: 09/07/2017 16:57    Assessment & Plan:  Richard Mcpherson is a 76 y.o. male with Stage 3 sacral decubitus ulcer. Some bedside debridement of the boggy area. Otherwise clean. On Eliquis and has some sensation, so did what was appropriate at the bedside.  -Santyl to the sacral area twice daily with damp to dry gauze in the wound  -Do not think further surgical debridement is needed at this time    Lindsay C Bridges 09/09/2017, 12:16 PM       

## 2017-09-09 NOTE — Progress Notes (Signed)
Pharmacy Antibiotic Note  Richard Mcpherson is a 77 y.o. male admitted on 09/06/2017 with sepsis.  Pharmacy has been consulted for Vancomycin and Zosyn dosing.  Plan:  Vancomycin 1000mg  IV q12h (not given a loading dose so 2nd dose will be earlier) Check trough at steady state Zosyn 3.375gm IV q8h, EID Monitor labs, renal fxn, progress and c/s Deescalate ABX when improved / appropriate.    Height: 6' (182.9 cm) Weight: 162 lb 0.6 oz (73.5 kg) IBW/kg (Calculated) : 77.6  Temp (24hrs), Avg:99.2 F (37.3 C), Min:98.1 F (36.7 C), Max:100.1 F (37.8 C)  Recent Labs  Lab 09/06/17 0840 09/06/17 0855 09/08/17 0442 09/09/17 0414  WBC 15.5*  --  10.0  --   CREATININE 0.51*  --  0.30* 0.33*  LATICACIDVEN  --  1.37  --   --     Estimated Creatinine Clearance: 80.4 mL/min (A) (by C-G formula based on SCr of 0.33 mg/dL (L)).    Allergies  Allergen Reactions  . Codeine Nausea Only and Other (See Comments)  . Crestor [Rosuvastatin Calcium] Other (See Comments)    bodyache  . Lipitor [Atorvastatin] Other (See Comments)    Body aches  . Statins   . Allopurinol Rash   Antimicrobials this admission: Vancomycin 2/21 >>  Zosyn 2/21 >>   Dose adjustments this admission:  Microbiology results:  BCx: pending  UCx: pending   Sputum:    MRSA PCR:   Thank you for allowing pharmacy to be a part of this patient's care.  Valrie Hart A 09/09/2017 9:22 AM

## 2017-09-10 DIAGNOSIS — Z7189 Other specified counseling: Secondary | ICD-10-CM

## 2017-09-10 DIAGNOSIS — Z515 Encounter for palliative care: Secondary | ICD-10-CM

## 2017-09-10 LAB — GLUCOSE, CAPILLARY
GLUCOSE-CAPILLARY: 104 mg/dL — AB (ref 65–99)
GLUCOSE-CAPILLARY: 106 mg/dL — AB (ref 65–99)
GLUCOSE-CAPILLARY: 113 mg/dL — AB (ref 65–99)
GLUCOSE-CAPILLARY: 98 mg/dL (ref 65–99)
Glucose-Capillary: 105 mg/dL — ABNORMAL HIGH (ref 65–99)
Glucose-Capillary: 109 mg/dL — ABNORMAL HIGH (ref 65–99)
Glucose-Capillary: 113 mg/dL — ABNORMAL HIGH (ref 65–99)

## 2017-09-10 LAB — LEGIONELLA PNEUMOPHILA SEROGP 1 UR AG: L. pneumophila Serogp 1 Ur Ag: NEGATIVE

## 2017-09-10 MED ORDER — AMOXICILLIN-POT CLAVULANATE 250-62.5 MG/5ML PO SUSR
500.0000 mg | Freq: Three times a day (TID) | ORAL | Status: DC
  Administered 2017-09-10 – 2017-09-11 (×4): 500 mg via ORAL
  Filled 2017-09-10 (×7): qty 10

## 2017-09-10 NOTE — Progress Notes (Signed)
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   Assessment/Plan: 1-sepsis secondary to HCAP (healthcare-associated pneumonia) -remains in stepdown; very low-grade fever overnight. -continue oxygen supplementation and continue to wean as tolerated -Continue antibiotics, but will transition to oral regimen. -Continue as needed DuoNeb -follow cx's results; so far no growth. -Sepsis features resolving.  Continue supportive care and current IV fluids. -continue PRN antipyretics   2-Essential hypertension -Continue current antihypertensive regimen.  3-hx of Stroke (cerebrum) (HCC)/cervical fusion: -essentially non-mobile, non-verbal and bedridden  -will need long term SNF -CM and SW aware of admission -patient's wife declining going back to same facility   -Patient able to track with his eyes and answering yes or no questions by blinking. Also was trying to articulate some words (even words were non-understandable)  4-Acute on chronic respiratory failure with hypoxia (HCC) -most likely due to HCAP  -patient trach dependent, coming from SNF and with recent hospital admission.  -continue oxygen supplementation and continue to wean as tolerated -Now after 24 hours without any fevers and improvement in his WBCs; will transition antibiotics to Augmentin twice a day and stop vancomycin and Zosyn.  5-multiple Decubitus ulcer of sacral region, unstageable and also affecting his upper back and Right heel -Continue to follow  recommendations as part of preventive measures and care instructions by wound care services. -ABI (especially on RLE), demonstrating no significant arterial occlusive disease -Status post debridement on his sacrum unstageable ulcer at bedside; will continue the use of Santyl.  Appreciate general surgery assistance and inputs.  6-hx of RUE DVT: -continue eliquis  -No signs of acute bleeding  7-Severe protein-calorie malnutrition (HCC) -Continue tube feedings and continue pro-stat -Appreciate recommendations by nutritional service. -continue free water for adequate hydration  8-hx of GOUT -Continue home regimen of allopurinol (3 times a week) -No signs of acute flare.  9-prior hx of parkinsonism  -continue amantadine   10-depression -continue zoloft   11-hx of BPH -continue flomax  12-hypernatremia -Continue free water and D5 half-normal saline -repeat BMET in am   13-hypokalemia -repleted and stable now -will continue to monitor intermittently   Code Status: Full Family Communication: wife at bedside  Disposition Plan: remains in stepdown, continue IV antibiotics, check ABI, follow general surgery recommendations for need of debridement on sacral ulcer; continue duoneb.    Consultants:  Wound care consult  General surgery  Palliative care   Procedures:  See below for x-ray reports  Arterial duplex for ABI: No evidence of hemodynamically significant lower extremity arterial occlusive disease at rest.   Antibiotics:  vanc and zosyn 09/06/17>>>09/10/17  augmentin 09/10/17  HPI/Subjective: No CP, no fever, no nausea, no vomiting. Improved air movement.  Objective: Vitals:   09/10/17 0928 09/10/17 1200  BP:    Pulse:    Resp:    Temp:  98 F (36.7 C)  SpO2: 97%     Intake/Output Summary (Last 24 hours) at 09/10/2017 1518 Last data filed at 09/10/2017 1502 Gross per 24 hour  Intake -  Output 2875 ml  Net -2875 ml   Filed Weights   09/08/17  0400 09/09/17 0500 09/10/17 0500  Weight: 75.3 kg (166 lb 0.1 oz) 73.5 kg (162 lb 0.6 oz) 73.5 kg (162 lb 0.6 oz)    Exam:   General: afebrile, no CP, no SOB, no nausea, no vomiting. Tolerating TF's fine and in major distress. Remains on trach collar requiring 20-30% FIO2 supplementation.  Cardiovascular: regular rate and rhythm, no rubs, no gallops, no JVD  Respiratory: no wheezing, no crackles, improved air movement, positive rhonchi.  Abdomen: soft, NT, ND, positive BS, PEG tube in place.   Musculoskeletal: no edema, no cyanosis, positive deep injury lesion and stage 2 pressure injury on RLE unchanged. Both legs feels warm.   Skin:  All the wounds are now cover with Tegaderm and the patient is using floating boots to prevent breakdown on his heels.  Stage 2 pressure injury in his upper back remained unchanged. unstageable decubitus ulcer and positive deep tissue injury in his RLE. (all wounds were present Prior to admission). Unstageable Sacrum wound was debrided at bedside by general surgery on 2/24.  Data Reviewed: Basic Metabolic Panel: Recent Labs  Lab 09/06/17 0840 09/07/17 1202 09/07/17 1750 09/08/17 0442 09/08/17 1719 09/09/17 0414  NA 149*  --   --  150*  --  149*  K 3.6  --   --  2.7*  --  3.8  CL 113*  --   --  115*  --  117*  CO2 25  --   --  25  --  23  GLUCOSE 118*  --   --  114*  --  117*  BUN 37*  --   --  42*  --  39*  CREATININE 0.51*  --   --  0.30*  --  0.33*  CALCIUM 8.7*  --   --  8.3*  --  8.2*  MG  --  1.9 1.9 2.0 1.8  --   PHOS  --  3.6 2.5 2.7 2.4*  --    Liver Function Tests: Recent Labs  Lab 09/06/17 0840  AST 37  ALT 57  ALKPHOS 301*  BILITOT 1.0  PROT 7.4  ALBUMIN 2.2*   CBC: Recent Labs  Lab 09/06/17 0840 09/08/17 0442  WBC 15.5* 10.0  NEUTROABS 13.2  --   HGB 10.9* 9.1*  HCT 37.9* 32.3*  MCV 86.5 87.5  PLT 321 235   Cardiac Enzymes: Recent Labs  Lab 09/06/17 0840  TROPONINI 0.04*   BNP (last 3 results) Recent Labs     08/20/17 0405 09/06/17 0840  BNP 66.0 70.0   CBG: Recent Labs  Lab 09/09/17 2007 09/10/17 0101 09/10/17 0522 09/10/17 0747 09/10/17 1151  GLUCAP 116* 113* 109* 104* 106*    Recent Results (from the past 240 hour(s))  Blood Culture (routine x 2)     Status: None (Preliminary result)   Collection Time: 09/06/17  8:42 AM  Result Value Ref Range Status   Specimen Description BLOOD BLOOD LEFT ARM DRAWN BY RN  Final   Special Requests   Final    BOTTLES DRAWN AEROBIC AND ANAEROBIC Blood Culture adequate volume   Culture   Final    NO GROWTH 4 DAYS Performed at [facility], [address] [place], [place] [address]    Report Status PENDING  Incomplete  Blood Culture (routine x 2)     Status: None (Preliminary result)   Collection Time: 09/06/17  8:43 AM  Result Value Ref Range Status   Specimen Description BLOOD LEFT ANTECUBITAL DRAWN BY RN  Final   Special Requests  Final    BOTTLES DRAWN AEROBIC AND ANAEROBIC Blood Culture adequate volume   Culture   Final    NO GROWTH 4 DAYS Performed at [facility], [address], [place], [place] [address]    Report Status PENDING  Incomplete  Culture, sputum-assessment     Status: None   Collection Time: 09/06/17  6:00 PM  Result Value Ref Range Status   Specimen Description EXPECTORATED SPUTUM  Final   Special Requests NONE  Final   Sputum evaluation   Final    THIS SPECIMEN IS ACCEPTABLE FOR SPUTUM CULTURE Performed at [facility], [address] [place], [place] [address]    Report Status 09/07/2017 FINAL  Final  Culture, respiratory (NON-Expectorated)     Status: None   Collection Time: 09/06/17  6:00 PM  Result Value Ref Range Status   Specimen Description   Final    EXPECTORATED SPUTUM Performed at [facility], [address], [place], [place] [address]    Special Requests   Final    NONE Reflexed from [id number] Performed at [facility], [address] [place], [place] [address]    Gram Stain   Final    ABUNDANT  WBC PRESENT, PREDOMINANTLY PMN ABUNDANT GRAM POSITIVE RODS    Culture   Final    Consistent with normal respiratory flora. Performed at [facility], [address] [place], [place] [address]    Report Status 09/09/2017 FINAL  Final     Studies: No results found.  Scheduled Meds: . allopurinol  100 mg Per Tube Once per day on Mon Wed Fri  . amantadine  100 mg Per Tube BID  . amoxicillin-clavulanate  500 mg Oral Q8H  . apixaban  2.5 mg Per Tube BID  . chlorhexidine  15 mL Mouth Rinse BID  . collagenase   Topical Daily  . collagenase   Topical BID  . famotidine  20 mg Per Tube BID  . free water  100 mL Per Tube Q8H  . ipratropium-albuterol  3 mL Nebulization Q6H  . mouth rinse  15 mL Mouth Rinse q12n4p  . metoprolol tartrate  37.5 mg Per Tube Daily  . modafinil  100 mg Per Tube Daily  . nutrition supplement (JUVEN)  1 packet Per Tube BID BM  . polyethylene glycol  17 g Per Tube Daily  . sertraline  50 mg Per Tube Daily  . tamsulosin  0.4 mg Oral Daily   Continuous Infusions: . dextrose 5 % and 0.45% NaCl 50 mL/hr at 09/10/17 0740  . feeding supplement (VITAL AF 1.2 CAL) 1,000 mL (09/08/17 0930)    Principal Problem:   HCAP (healthcare-associated pneumonia) Active Problems:   Essential hypertension   Stroke (cerebrum) (HCC)   Sepsis (HCC)   Acute on chronic respiratory failure with hypoxia (HCC)   Tracheostomy status (HCC)   Decubitus ulcer of sacral region, stage 3 (HCC)   Severe protein-calorie malnutrition (HCC)   Palliative care by specialist   Counseling regarding advanced care planning and goals of care   Time spent: 35 minutes (> 50% of the time intended for face to face and discussion with wife about findings, plan of care and interventions).    Vassie Loll  [facility] Pager [phone]. If 7PM-7AM, please contact night-coverage at [email or web address], password [facility] 09/10/2017, 3:18 PM  LOS: 4 days

## 2017-09-10 NOTE — Progress Notes (Signed)
Daily Progress Note   Patient Name: Richard Mcpherson       Date: 09/10/2017 DOB: [birth year]  Age: 77 y.o. MRN#: [id number] Attending Physician: Richard Loll, MD Primary Care Physician: Richard Peri, MD Admit Date: 09/06/2017  Reason for Consultation/Follow-up: Establishing goals of care and Psychosocial/spiritual support  Subjective: Richard Mcpherson is resting quietly in bed.  He looks in my general direction and briefly makes but does not keep eye contact.  Present today at bedside is his wife.  We talked about his wounds, surgical debridement.  I asked Richard Mcpherson if she has seen the wound.  She says she has not.  We talked about disposition.  Richard Mcpherson states that they are considering [facility] and [facility].  Length of Stay: 4  Current Medications: Scheduled Meds:  . allopurinol  100 mg Per Tube Once per day on Mon Wed Fri  . amantadine  100 mg Per Tube BID  . apixaban  2.5 mg Per Tube BID  . chlorhexidine  15 mL Mouth Rinse BID  . collagenase   Topical Daily  . collagenase   Topical BID  . famotidine  20 mg Per Tube BID  . free water  100 mL Per Tube Q8H  . ipratropium-albuterol  3 mL Nebulization Q6H  . mouth rinse  15 mL Mouth Rinse q12n4p  . metoprolol tartrate  37.5 mg Per Tube Daily  . modafinil  100 mg Per Tube Daily  . nutrition supplement (JUVEN)  1 packet Per Tube BID BM  . polyethylene glycol  17 g Per Tube Daily  . sertraline  50 mg Per Tube Daily  . tamsulosin  0.4 mg Oral Daily    Continuous Infusions: . dextrose 5 % and 0.45% NaCl 50 mL/hr at 09/10/17 0740  . feeding supplement (VITAL AF 1.2 CAL) 1,000 mL (09/08/17 0930)  . piperacillin-tazobactam (ZOSYN)  IV 3.375 g (09/10/17 1042)  . vancomycin 1,000 mg (09/10/17 1043)    PRN Meds: acetaminophen,  antiseptic oral rinse, morphine injection  Physical Exam  Constitutional: No distress.  Appears acutely/chronically ill.  Frail  HENT:  Head: Atraumatic.  Temporal wasting  Cardiovascular: Normal rate.  Pulmonary/Chest: No respiratory distress.  Trach  Abdominal: Soft. He exhibits no distension.  PEG tube site  Musculoskeletal:  Functional quadriplegia  Neurological: He is alert.  Nonverbal  Skin: Skin is warm and dry.  Multiple wounds as noted by nursing  Nursing note and vitals reviewed.           Vital Signs: BP (!) 115/58   Pulse 88   Temp 98 F (36.7 C) (Axillary)   Resp (!) 32   Ht 6' (1.829 m)   Wt 73.5 kg (162 lb 0.6 oz)   SpO2 97%   BMI 21.98 kg/m  SpO2: SpO2: 97 % O2 Device: O2 Device: Tracheostomy Collar O2 Flow Rate: O2 Flow Rate (L/min): 10 L/min  Intake/output summary:   Intake/Output Summary (Last 24 hours) at 09/10/2017 1300 Last data filed at 09/10/2017 0500 Gross per 24 hour  Intake -  Output 2350 ml  Net -2350 ml   LBM: Last BM Date: (PTA this admission) Baseline Weight: Weight: 84.4 kg (186 lb) Most recent weight: Weight: 73.5 kg (162 lb 0.6 oz)       Palliative Assessment/Data:      Patient Active Problem List   Diagnosis Date Noted  . Palliative care by specialist   . Counseling regarding advanced care planning and goals of care   . HCAP (healthcare-associated pneumonia) 09/06/2017  . Acute respiratory failure with hypoxemia (HCC)   . Palliative care encounter   . Severe protein-calorie malnutrition (HCC) 08/21/2017  . Decubitus ulcer of sacral region, stage 3 (HCC) 08/20/2017  . Staphylococcal sepsis (HCC) 08/20/2017  . Healthcare-associated pneumonia   . Acute on chronic respiratory failure with hypoxia (HCC) 08/19/2017  . Sepsis due to undetermined organism (HCC) 08/19/2017  . Aspiration pneumonia of both lower lobes due to gastric secretions (HCC) 08/19/2017  . Tracheostomy status (HCC) 08/19/2017  . Deep vein thrombosis  (DVT) of right upper extremity (HCC) 08/19/2017  . Lobar pneumonia (HCC) 08/19/2017  . Transaminasemia   . Sepsis (HCC) 08/18/2017  . Pneumonia 08/18/2017  . Acute hypoxemic respiratory failure (HCC) 08/18/2017  . 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 Stroke (cerebrum) (HCC) 04/18/2017  . SAH (subarachnoid hemorrhage) (HCC) 04/18/2017  . 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 Hyperlipidemia 03/25/2009  . Essential hypertension 03/25/2009  . CAD, NATIVE VESSEL 03/25/2009  . PALPITATIONS 03/25/2009    Palliative Care Assessment & Plan   Patient Profile: 77 y.o.malewith past medical history of cerebral aneurysm s/p coiling in July 2018 then CVA/SDH/cervical fusion/cardiac arrest/trach/PEG/LTAC in 06/2017. He was admitted on2/2/2019with fever the day after he transferred from [facility] to SNF. Initial workup revealed sepsis secondary to bacteremia and possible aspiration pneumonia. At SNF he was on trach collar at this time he is requiring ventilator support for respiratory failure.  Readmitted again 2-21.  Assessment: Sepsis secondary to healthcare associated pneumonia; treated with IV fluids and antibiotics.  Respiratory support. Multiple wounds; receiving tube feeding for nutrition, sacral wound received surgical debridement.  Recommendations/Plan:  At this point, full scope treatment.  Continue CODE STATUS discussions.  Continue to work toward disposition.  Goals of Care and Additional Recommendations:  Limitations on Scope of Treatment: Full Scope Treatment  Code Status:    Code Status Orders  (From admission, onward)        Start     Ordered   09/06/17 1801  Full code  Continuous     09/06/17 1809    Code Status History    Date Active Date Inactive Code Status Order ID Comments User Context   08/18/2017 23:45 08/27/2017 17:21 Full Code [id number]  Richard Blinks, DO ED  07/20/2017 23:20 08/17/2017  17:15 Full Code [id number]  Richard Mcpherson Inpatient       Prognosis:   < 6 months would not be surprising based on functional status, multiple wounds, frailty.  Discharge Planning:  Wife is considering [facility] and [facility] for rehab  Care plan was discussed with nursing staff, social worker, and Dr. Gwenlyn Mcpherson on next rounds  Thank you for allowing the Palliative Medicine Team to assist in the care of this patient.   Time In:  1315 Time Out:  1340 Total Time  25 minutes Prolonged Time Billed  no       Greater than 50%  of this time was spent counseling and coordinating care related to the above assessment and plan.  Katheran Awe, NP  Please contact Palliative Medicine Team phone at [phone] for questions and concerns.

## 2017-09-10 NOTE — Clinical Social Work Note (Signed)
Clinical Social Work Assessment  Patient Details  Name: Richard Mcpherson MRN: [id number] Date of Birth: 12/11/1940  Date of referral:  09/10/17               Reason for consult:  Discharge Planning                Permission sought to share information with:  Oceanographer granted to share information::  Yes, Verbal Permission Granted  Name::        Agency::  [facility]  Relationship::     Contact Information:     Housing/Transportation Living arrangements for the past 2 months:  Skilled Building surveyor of Information:  Spouse Patient Interpreter Needed:  None Criminal Activity/Legal Involvement Pertinent to Current Situation/Hospitalization:  No - Comment as needed Significant Relationships:  Spouse Lives with:  Spouse Do you feel safe going back to the place where you live?  Yes Need for family participation in patient care:  Yes (Comment)  Care giving concerns: [name] wants a new SNF placement.   Social Worker assessment / plan: Pt is a 77 year old male admitted from [facility]. Pt known to LCSW from previous admission. Pt went to [facility] from [facility] in late January. At this time, pt's wife would like to find new placement for pt. Discussed options with her. She requests referral to [facility]. Thayer Ohm from [facility] was here and pt's wife accepted LCSW offer for him to come and speak with her. She states she would like to tour the facility but that LCSW can start the clinical referral. She does not have any other referral options at this time. Will follow for support and dc planning. MD stated pt could potentially be stable for dc soon.  Employment status:  Retired Health and safety inspector:  Medicare PT Recommendations:  Not assessed at this time Information / Referral to community resources:     Patient/Family's Response to care: [name] accepting of care. Unable to assess pt.  Patient/Family's Understanding of and Emotional  Response to Diagnosis, Current Treatment, and Prognosis: [name] is supportive of pt. She is hopeful for pt to recover enough to return home from SNF.  Emotional Assessment Appearance:  Appears stated age Attitude/Demeanor/Rapport:  Unable to Assess Affect (typically observed):  Unable to Assess Orientation:  Oriented to Self, Oriented to Place, Oriented to Situation Alcohol / Substance use:  Not Applicable Psych involvement (Current and /or in the community):  No (Comment)  Discharge Needs  Concerns to be addressed:  Discharge Planning Concerns Readmission within the last 30 days:  Yes Current discharge risk:  Chronically ill Barriers to Discharge:  Other(patient's wife wants new SNF for pt)   Elliot Gault, LCSW 09/10/2017, 11:56 AM

## 2017-09-10 NOTE — Progress Notes (Signed)
Cleaned around stoma, small amount yellow secretions. Patient is cough up thick yellow secretions. Have turned oxygen to 28% aerosol.

## 2017-09-11 DIAGNOSIS — L89309 Pressure ulcer of unspecified buttock, unspecified stage: Secondary | ICD-10-CM | POA: Diagnosis not present

## 2017-09-11 DIAGNOSIS — L89150 Pressure ulcer of sacral region, unstageable: Secondary | ICD-10-CM | POA: Diagnosis not present

## 2017-09-11 DIAGNOSIS — L89153 Pressure ulcer of sacral region, stage 3: Secondary | ICD-10-CM

## 2017-09-11 DIAGNOSIS — J90 Pleural effusion, not elsewhere classified: Secondary | ICD-10-CM | POA: Diagnosis not present

## 2017-09-11 DIAGNOSIS — R5381 Other malaise: Secondary | ICD-10-CM | POA: Diagnosis not present

## 2017-09-11 DIAGNOSIS — Z515 Encounter for palliative care: Secondary | ICD-10-CM | POA: Diagnosis not present

## 2017-09-11 DIAGNOSIS — L89609 Pressure ulcer of unspecified heel, unspecified stage: Secondary | ICD-10-CM | POA: Diagnosis not present

## 2017-09-11 DIAGNOSIS — R293 Abnormal posture: Secondary | ICD-10-CM | POA: Diagnosis not present

## 2017-09-11 DIAGNOSIS — G20 Parkinson's disease: Secondary | ICD-10-CM | POA: Diagnosis not present

## 2017-09-11 DIAGNOSIS — J9600 Acute respiratory failure, unspecified whether with hypoxia or hypercapnia: Secondary | ICD-10-CM | POA: Diagnosis not present

## 2017-09-11 DIAGNOSIS — I69391 Dysphagia following cerebral infarction: Secondary | ICD-10-CM | POA: Diagnosis not present

## 2017-09-11 DIAGNOSIS — E46 Unspecified protein-calorie malnutrition: Secondary | ICD-10-CM | POA: Diagnosis not present

## 2017-09-11 DIAGNOSIS — I639 Cerebral infarction, unspecified: Secondary | ICD-10-CM | POA: Diagnosis not present

## 2017-09-11 DIAGNOSIS — J150 Pneumonia due to Klebsiella pneumoniae: Secondary | ICD-10-CM | POA: Diagnosis not present

## 2017-09-11 DIAGNOSIS — E782 Mixed hyperlipidemia: Secondary | ICD-10-CM | POA: Diagnosis not present

## 2017-09-11 DIAGNOSIS — I6789 Other cerebrovascular disease: Secondary | ICD-10-CM | POA: Diagnosis not present

## 2017-09-11 DIAGNOSIS — R6520 Severe sepsis without septic shock: Secondary | ICD-10-CM | POA: Diagnosis not present

## 2017-09-11 DIAGNOSIS — A412 Sepsis due to unspecified staphylococcus: Secondary | ICD-10-CM | POA: Diagnosis not present

## 2017-09-11 DIAGNOSIS — E559 Vitamin D deficiency, unspecified: Secondary | ICD-10-CM | POA: Diagnosis not present

## 2017-09-11 DIAGNOSIS — R2689 Other abnormalities of gait and mobility: Secondary | ICD-10-CM | POA: Diagnosis not present

## 2017-09-11 DIAGNOSIS — L89159 Pressure ulcer of sacral region, unspecified stage: Secondary | ICD-10-CM | POA: Diagnosis not present

## 2017-09-11 DIAGNOSIS — Z7901 Long term (current) use of anticoagulants: Secondary | ICD-10-CM | POA: Diagnosis not present

## 2017-09-11 DIAGNOSIS — S8011XA Contusion of right lower leg, initial encounter: Secondary | ICD-10-CM | POA: Diagnosis present

## 2017-09-11 DIAGNOSIS — Z87891 Personal history of nicotine dependence: Secondary | ICD-10-CM | POA: Diagnosis not present

## 2017-09-11 DIAGNOSIS — M6281 Muscle weakness (generalized): Secondary | ICD-10-CM | POA: Diagnosis not present

## 2017-09-11 DIAGNOSIS — Z6822 Body mass index (BMI) 22.0-22.9, adult: Secondary | ICD-10-CM | POA: Diagnosis not present

## 2017-09-11 DIAGNOSIS — Z930 Tracheostomy status: Secondary | ICD-10-CM | POA: Diagnosis not present

## 2017-09-11 DIAGNOSIS — M1990 Unspecified osteoarthritis, unspecified site: Secondary | ICD-10-CM | POA: Diagnosis not present

## 2017-09-11 DIAGNOSIS — M109 Gout, unspecified: Secondary | ICD-10-CM | POA: Diagnosis not present

## 2017-09-11 DIAGNOSIS — Z7401 Bed confinement status: Secondary | ICD-10-CM | POA: Diagnosis not present

## 2017-09-11 DIAGNOSIS — E43 Unspecified severe protein-calorie malnutrition: Secondary | ICD-10-CM | POA: Diagnosis present

## 2017-09-11 DIAGNOSIS — J189 Pneumonia, unspecified organism: Secondary | ICD-10-CM | POA: Diagnosis not present

## 2017-09-11 DIAGNOSIS — J984 Other disorders of lung: Secondary | ICD-10-CM | POA: Diagnosis not present

## 2017-09-11 DIAGNOSIS — L89102 Pressure ulcer of unspecified part of back, stage 2: Secondary | ICD-10-CM | POA: Diagnosis present

## 2017-09-11 DIAGNOSIS — F0631 Mood disorder due to known physiological condition with depressive features: Secondary | ICD-10-CM | POA: Diagnosis not present

## 2017-09-11 DIAGNOSIS — J9621 Acute and chronic respiratory failure with hypoxia: Secondary | ICD-10-CM | POA: Diagnosis not present

## 2017-09-11 DIAGNOSIS — Z9911 Dependence on respirator [ventilator] status: Secondary | ICD-10-CM | POA: Diagnosis not present

## 2017-09-11 DIAGNOSIS — Z430 Encounter for attention to tracheostomy: Secondary | ICD-10-CM | POA: Diagnosis not present

## 2017-09-11 DIAGNOSIS — J449 Chronic obstructive pulmonary disease, unspecified: Secondary | ICD-10-CM | POA: Diagnosis not present

## 2017-09-11 DIAGNOSIS — I2510 Atherosclerotic heart disease of native coronary artery without angina pectoris: Secondary | ICD-10-CM | POA: Diagnosis not present

## 2017-09-11 DIAGNOSIS — J9811 Atelectasis: Secondary | ICD-10-CM | POA: Diagnosis not present

## 2017-09-11 DIAGNOSIS — I10 Essential (primary) hypertension: Secondary | ICD-10-CM | POA: Diagnosis not present

## 2017-09-11 DIAGNOSIS — Z8673 Personal history of transient ischemic attack (TIA), and cerebral infarction without residual deficits: Secondary | ICD-10-CM | POA: Diagnosis not present

## 2017-09-11 DIAGNOSIS — Y95 Nosocomial condition: Secondary | ICD-10-CM | POA: Diagnosis not present

## 2017-09-11 DIAGNOSIS — M2560 Stiffness of unspecified joint, not elsewhere classified: Secondary | ICD-10-CM | POA: Diagnosis not present

## 2017-09-11 DIAGNOSIS — R1311 Dysphagia, oral phase: Secondary | ICD-10-CM | POA: Diagnosis not present

## 2017-09-11 DIAGNOSIS — Z86718 Personal history of other venous thrombosis and embolism: Secondary | ICD-10-CM | POA: Diagnosis not present

## 2017-09-11 DIAGNOSIS — E785 Hyperlipidemia, unspecified: Secondary | ICD-10-CM | POA: Diagnosis not present

## 2017-09-11 DIAGNOSIS — A419 Sepsis, unspecified organism: Secondary | ICD-10-CM | POA: Diagnosis present

## 2017-09-11 DIAGNOSIS — L89154 Pressure ulcer of sacral region, stage 4: Secondary | ICD-10-CM | POA: Diagnosis present

## 2017-09-11 DIAGNOSIS — J440 Chronic obstructive pulmonary disease with acute lower respiratory infection: Secondary | ICD-10-CM | POA: Diagnosis present

## 2017-09-11 DIAGNOSIS — F419 Anxiety disorder, unspecified: Secondary | ICD-10-CM | POA: Diagnosis present

## 2017-09-11 DIAGNOSIS — E870 Hyperosmolality and hypernatremia: Secondary | ICD-10-CM | POA: Diagnosis present

## 2017-09-11 DIAGNOSIS — L89122 Pressure ulcer of left upper back, stage 2: Secondary | ICD-10-CM | POA: Diagnosis not present

## 2017-09-11 DIAGNOSIS — Z431 Encounter for attention to gastrostomy: Secondary | ICD-10-CM | POA: Diagnosis not present

## 2017-09-11 DIAGNOSIS — N400 Enlarged prostate without lower urinary tract symptoms: Secondary | ICD-10-CM | POA: Diagnosis not present

## 2017-09-11 DIAGNOSIS — E876 Hypokalemia: Secondary | ICD-10-CM | POA: Diagnosis present

## 2017-09-11 DIAGNOSIS — J441 Chronic obstructive pulmonary disease with (acute) exacerbation: Secondary | ICD-10-CM | POA: Diagnosis not present

## 2017-09-11 DIAGNOSIS — R279 Unspecified lack of coordination: Secondary | ICD-10-CM | POA: Diagnosis not present

## 2017-09-11 DIAGNOSIS — J181 Lobar pneumonia, unspecified organism: Secondary | ICD-10-CM | POA: Diagnosis not present

## 2017-09-11 DIAGNOSIS — R1312 Dysphagia, oropharyngeal phase: Secondary | ICD-10-CM | POA: Diagnosis present

## 2017-09-11 DIAGNOSIS — F329 Major depressive disorder, single episode, unspecified: Secondary | ICD-10-CM | POA: Diagnosis present

## 2017-09-11 DIAGNOSIS — J151 Pneumonia due to Pseudomonas: Secondary | ICD-10-CM | POA: Diagnosis not present

## 2017-09-11 LAB — CULTURE, BLOOD (ROUTINE X 2)
CULTURE: NO GROWTH
Culture: NO GROWTH
SPECIAL REQUESTS: ADEQUATE
Special Requests: ADEQUATE

## 2017-09-11 LAB — BASIC METABOLIC PANEL
Anion gap: 7 (ref 5–15)
BUN: 30 mg/dL — AB (ref 6–20)
CHLORIDE: 108 mmol/L (ref 101–111)
CO2: 24 mmol/L (ref 22–32)
Calcium: 8.4 mg/dL — ABNORMAL LOW (ref 8.9–10.3)
Glucose, Bld: 124 mg/dL — ABNORMAL HIGH (ref 65–99)
Potassium: 3.1 mmol/L — ABNORMAL LOW (ref 3.5–5.1)
Sodium: 139 mmol/L (ref 135–145)

## 2017-09-11 LAB — GLUCOSE, CAPILLARY
GLUCOSE-CAPILLARY: 127 mg/dL — AB (ref 65–99)
Glucose-Capillary: 109 mg/dL — ABNORMAL HIGH (ref 65–99)
Glucose-Capillary: 107 mg/dL — ABNORMAL HIGH (ref 65–99)
Glucose-Capillary: 126 mg/dL — ABNORMAL HIGH (ref 65–99)

## 2017-09-11 MED ORDER — POTASSIUM CHLORIDE 20 MEQ/15ML (10%) PO SOLN: 20.0000 meq | Freq: Every day | ORAL | Status: DC

## 2017-09-11 MED ORDER — AMOXICILLIN-POT CLAVULANATE 250-62.5 MG/5ML PO SUSR: 500.0000 mg | Freq: Three times a day (TID) | ORAL | Status: AC

## 2017-09-11 MED ORDER — FREE WATER: 100.0000 mL | Freq: Three times a day (TID) | Status: DC

## 2017-09-11 MED ORDER — JUVEN PO PACK: 1.0000 | PACK | Freq: Two times a day (BID) | ORAL | Status: DC

## 2017-09-11 MED ORDER — VITAL AF 1.2 CAL PO LIQD: 1000.0000 mL | ORAL | Status: DC

## 2017-09-11 MED ORDER — IPRATROPIUM-ALBUTEROL 0.5-2.5 (3) MG/3ML IN SOLN: 3.0000 mL | Freq: Four times a day (QID) | RESPIRATORY_TRACT | Status: AC

## 2017-09-11 MED ORDER — COLLAGENASE 250 UNIT/GM EX OINT: 1.0000 "application " | TOPICAL_OINTMENT | Freq: Every day | CUTANEOUS | Status: DC

## 2017-09-11 NOTE — Progress Notes (Signed)
1712 Called and spoke to receiving nurse at [facility] and report on patient given. Patient will be transporting to [facility] room# 205 today. [facility] notified and made aware of request for transport needed.

## 2017-09-11 NOTE — Care Management Important Message (Signed)
Important Message  Patient Details  Name: Richard Mcpherson MRN: [id number] Date of Birth: August 10, 1940   Medicare Important Message Given:  Yes    Oniya Mandarino, Chrystine Oiler, RN 09/11/2017, 4:19 PM

## 2017-09-11 NOTE — Discharge Summary (Signed)
Physician Discharge Summary  Richard Mcpherson JXB:[id number] DOB: 1941-01-01 DOA: 09/06/2017  PCP: [name], MD  Admit date: 09/06/2017 Discharge date: 09/11/2017  Time spent: 35 minutes  Recommendations for Outpatient Follow-up:  Repeat BMET in 5 days to follow electrolytes  Repeat CBC to follow Hgb trend  Close follow up and care of pressure injury wounds in his body. See hospital course section for further recommendations  Discharge Diagnoses:  Principal Problem:   HCAP (healthcare-associated pneumonia) Active Problems:   Essential hypertension   Stroke (cerebrum) ([place])   Sepsis ([place])   Acute on chronic respiratory failure with hypoxia ([place])   Tracheostomy status ([place])   Decubitus ulcer of sacral region, stage 3 ([place])   Severe protein-calorie malnutrition ([place])   Palliative care by specialist   Counseling regarding advanced care planning and goals of care   Discharge Condition: stable and with sepsis features resolved. Will discharge to [facility] for further care and rehab.  Diet recommendation: continue tube feeding   Filed Weights   09/09/17 0500 09/10/17 0500 09/11/17 0400  Weight: 73.5 kg (162 lb 0.6 oz) 73.5 kg (162 lb 0.6 oz) 76.3 kg (168 lb 3.4 oz)    History of present illness:  77 year old male with a past medical history significant for prior CVA currently on Eliquis, subsequent tracheostomy and PEG tube placement, hypertension, dyslipidemia, gout and severe protein malnutrition; who presented to the ED from nursing home secondary to increase tachypnea, oxygen desaturation, increased secretions out of his tracheostomy and elevated fever.  Note patient was recently admitted over 10 days ago with HCAP and bacteremia.  There has not been any mentions of signs of aspiration, chest pain, abdominal pain, abdominal distention, blood in his urine, nausea, vomiting, melena, hematemesis or any other complaints.  ED Course: patient met sepsis criteria on presentation (Temp of  104, tachycardic, elevated RR, elevated WBX's and CXR findings suggesting PNA); received IVF's, blood cx's taken and broad spectrum antibiotics started. CXR demonstrating left basilar opacity consistent with PNA.  Hospital Course:  1-sepsis secondary toHCAP (healthcare-associated pneumonia) -no fever over 36 hours. -normal WBC's -Sepsis features resolved.   -continue oxygen supplementation and continue to wean further as tolerated -Continue antibiotics by mouth  -Continue DuoNeb -cx's final results pending; so far no growth. -Will discharge to [facility] for further care and rehab.  2-Essential hypertension -Continue current antihypertensive regimen. -BP stable.  3-hx ofStroke (cerebrum) (HCC)/cervical fusion: -essentially non-mobile, non-verbal and bedridden  -will discharge LTAC -CM and SW aware and will help with discharge to [facility]  -Patient's wife declined going back to prior SNF; in agreement with LTAC discharge. -Patient able to track with his eyes and answering yes or no questions by blinking. Also was trying to articulate some words (even words were non-understandable)  4-Acute on chronic respiratory failure with hypoxia ([place]) -most likely due to HCAP  -patient trach dependent, coming from SNF and with recent hospital admission.  -continue oxygen supplementation and continue to wean as tolerated -Now after 36 hours without any fevers and his WBCs WNL; will complete antibiotic therapy using Augmentin.  5-multipleDecubitus ulcer of sacral region, unstageableand also affecting his upper back and Right heel. -Continue to follow recommendations as part of preventive measures and care instructions by wound care services. -ABI (especially on RLE), demonstrating no significant arterial occlusive disease -Status post debridement on his sacrum unstageable ulcer at bedside; will continue the use of Santyl.  Appreciate general surgery assistance and I recommendations.  6-hx of RUE  DVT: -continue eliquis  -  No signs of acute bleeding -Repeat CBC   7-Severe protein-calorie malnutrition ([place]) -Continue tube feedings and continue pro-stat -Appreciate recommendations by nutritional service. -continue free water for adequate hydration  8-hx of GOUT -Continue home regimen of allopurinol (3 times a week) -No signs of acute flare.  9-prior hx of parkinsonism  -Continue amantadine   10-depression -Continue zoloft   11-hx of BPH -continue flomax  12-hypernatremia -Continue free water and D5 half-normal saline -repeat BMET in am   13-hypokalemia -Repleted and started on daily maintenance.   Procedures:  See below for x-ray reports  Arterial duplex for ABI: No evidence of hemodynamically significant lower extremity arterial occlusive disease at rest.    Consultations:  Palliative care   General surgery   Wound care Consult    Discharge Exam: Vitals:   09/11/17 1200 09/11/17 1448  BP: 117/64 116/64  Pulse: 76 76  Resp: (!) 33 (!) 32  Temp:    SpO2: 97% 97%    General: afebrile for over 36 hours, no CP, no SOB, no nausea, no vomiting. Tolerating TF's fine and in major distress. Remains on trach collar requiring 20-30% FIO2 supplementation.  Cardiovascular: regular rate and rhythm, no rubs, no gallops, no JVD  Respiratory: no wheezing, no crackles, improved air movement, positive rhonchi.  Abdomen: soft, NT, ND, positive BS, PEG tube in place.   Musculoskeletal: no edema, no cyanosis, positive deep injury lesion and stage 2 pressure injury on RLE unchanged. Both legs feels warm.   Skin:  All the wounds are now cover with Tegaderm and the patient is using floating boots to prevent breakdown on his heels.  Stage 2 pressure injury in his upper back remained unchanged. unstageable decubitus ulcer and positive deep tissue injury in his RLE. (all wounds were present Prior to admission). Unstageable Sacrum wound was debrided at bedside by  general surgery on 2/24.   Discharge Instructions   Discharge Instructions    Discharge instructions   Complete by:  As directed    Take medications as prescribed  Assure good hydration  Complete antibiotics as prescribed Repeat BMET in 5 days to follow electrolytes  Repeat CBC to follow Hgb trend  Close follow up and care of pressure injury wounds in his body.     Allergies as of 09/11/2017      Reactions   Codeine Nausea Only, Other (See Comments)   Crestor [rosuvastatin Calcium] Other (See Comments)   bodyache   Lipitor [atorvastatin] Other (See Comments)   Body aches   Statins    Allopurinol Rash      Medication List    TAKE these medications   allopurinol 100 MG tablet Commonly known as:  ZYLOPRIM Place 100 mg into feeding tube 3 (three) times a week. Every Tuesday, Thursday, Sunday   amantadine 100 MG capsule Commonly known as:  SYMMETREL Place 100 mg into feeding tube 2 (two) times daily.   amoxicillin-clavulanate 250-62.5 MG/5ML suspension Commonly known as:  AUGMENTIN Take 10 mLs (500 mg total) by mouth every 8 (eight) hours for 6 days.   antiseptic oral rinse Liqd 5 mLs by Mouth Rinse route as needed for dry mouth.   collagenase ointment Commonly known as:  SANTYL Apply 1 application topically daily. Applied to mid back along the vertebra with the slot is on the wound bed.  Cover with saline moistened gauze, then foam dressing.  Change daily and as needed for soilage.  Apply also twice a day to sacral wound and apply them because  an ABD pad over the area. What changed:  additional instructions   ELIQUIS 2.5 MG Tabs tablet Generic drug:  apixaban Place 2.5 mg into feeding tube 2 (two) times daily.   famotidine 20 MG tablet Commonly known as:  PEPCID Place 20 mg into feeding tube 2 (two) times daily.   feeding supplement (PRO-STAT SUGAR FREE 64) Liqd Take 30 mLs by mouth 2 (two) times daily with a meal. For wound healing.   feeding supplement  (VITAL AF 1.2 CAL) Liqd Place 1,000 mLs into feeding tube continuous.   nutrition supplement (JUVEN) Pack Place 1 packet into feeding tube 2 (two) times daily between meals. Start taking on:  09/12/2017   Fish Oil 1000 MG Caps Place 1 capsule into feeding tube daily.   free water Soln Place 100 mLs into feeding tube every 8 (eight) hours.   ipratropium-albuterol 0.5-2.5 (3) MG/3ML Soln Commonly known as:  DUONEB Take 3 mLs by nebulization every 6 (six) hours.   Melatonin 3 MG Tabs Take 1 tablet by mouth at bedtime.   Metoprolol Tartrate 37.5 MG Tabs Place 1 tablet into feeding tube daily.   modafinil 100 MG tablet Commonly known as:  PROVIGIL Take 100 mg by mouth daily. Via G-tube for sleep   polyethylene glycol powder powder Commonly known as:  GLYCOLAX/MIRALAX Take 17 g by mouth daily.   potassium chloride 20 MEQ/15ML (10%) Soln Take 15 mLs (20 mEq total) by mouth daily.   sertraline 50 MG tablet Commonly known as:  ZOLOFT Place 50 mg into feeding tube daily.   tamsulosin 0.4 MG Caps capsule Commonly known as:  FLOMAX 0.4 mg daily. Per G-Tube      Allergies  Allergen Reactions  . Codeine Nausea Only and Other (See Comments)  . Crestor [Rosuvastatin Calcium] Other (See Comments)    bodyache  . Lipitor [Atorvastatin] Other (See Comments)    Body aches  . Statins   . Allopurinol Rash    The results of significant diagnostics from this hospitalization (including imaging, microbiology, ancillary and laboratory) are listed below for reference.    Significant Diagnostic Studies: Ct Chest Wo Contrast  Result Date: 08/22/2017 CLINICAL DATA:  Respiratory failure.  Former smoker. EXAM: CT CHEST WITHOUT CONTRAST TECHNIQUE: Multidetector CT imaging of the chest was performed following the standard protocol without IV contrast. COMPARISON:  None. FINDINGS: Cardiovascular: Heart size appears normal. Aortic atherosclerosis noted. Calcifications within the left circumflex,  lad coronary arteries noted. Mediastinum/Nodes: The trachea appears patent and midline. Tracheostomy tube tip is above the carina. Normal appearance of the esophagus. Subcarinal lymph node is enlarged measuring 1.4 cm. No axillary or supraclavicular adenopathy. Evaluation of the hilar nodes limited due to lack of IV contrast material. Lungs/Pleura: Trace left pleural effusion. Moderate change of emphysema. There is dense airspace consolidation with air bronchograms involving much of the left lower lobe. Mild subsegmental atelectasis and subpleural consolidation noted in the posterior right lower lobe. Scattered small ground-glass and tree-in-bud nodules identified within the left upper lobe. Calcified granuloma identified within the right upper lobe. Small noncalcified right middle lobe lung nodule measures 3 mm, image 91 of series 4. Upper Abdomen: No acute abnormality. Musculoskeletal: No chest wall mass or suspicious bone lesions identified. IMPRESSION: 1. There is dense airspace consolidation involving much of the left lower lobe. Findings are compatible with pneumonia and/or aspiration. Followup imaging following appropriate antibiotic therapy is advised to ensure resolution. In the absence of resolution there should be consideration for underlying pulmonary malignancy. 2. Small  scattered ground-glass and tree-in-bud nodules in the left upper lobe are likely inflammatory in etiology. 3. Enlarged subcarinal lymph node may be reactive in the setting of pneumonia. Attention on follow-up imaging is advised. 4. Aortic Atherosclerosis (ICD10-I70.0) and Emphysema (ICD10-J43.9). Atherosclerotic calcifications in the LAD and left circumflex coronary arteries noted. 5. Right middle lobe lung nodule measures 3 mm. Non-contrast chest CT can be considered in 12 months if patient is high-risk. This recommendation follows the consensus statement: Guidelines for Management of Incidental Pulmonary Nodules Detected on CT Images:  From the Fleischner Society 2017; Radiology 2017; 284:228-243. Electronically Signed   By: Kerby Moors M.D.   On: 08/22/2017 10:21   US Venous Img Upper Uni Right  Result Date: 08/20/2017 CLINICAL DATA:  77 year old male with a history right upper extremity pain and edema EXAM: RIGHT UPPER EXTREMITY VENOUS DOPPLER ULTRASOUND TECHNIQUE: Gray-scale sonography with graded compression, as well as color Doppler and duplex ultrasound were performed to evaluate the upper extremity deep venous system from the level of the subclavian vein and including the jugular, axillary, basilic, radial, ulnar and upper cephalic vein. Spectral Doppler was utilized to evaluate flow at rest and with distal augmentation maneuvers. COMPARISON:  None. FINDINGS: Contralateral Subclavian Vein: Respiratory phasicity is normal and symmetric with the symptomatic side. No evidence of thrombus. Normal compressibility. Internal Jugular Vein: No evidence of thrombus. Normal compressibility, respiratory phasicity and response to augmentation. Subclavian Vein: No evidence of thrombus. Normal compressibility, respiratory phasicity and response to augmentation. Axillary Vein: No evidence of thrombus. Normal compressibility, respiratory phasicity and response to augmentation. Cephalic Vein: No evidence of thrombus. Normal compressibility, respiratory phasicity and response to augmentation. Basilic Vein: No evidence of thrombus. Normal compressibility, respiratory phasicity and response to augmentation. Brachial Veins: No evidence of thrombus. Normal compressibility, respiratory phasicity and response to augmentation. Radial Veins: No evidence of thrombus. Normal compressibility, respiratory phasicity and response to augmentation. Ulnar Veins: No evidence of thrombus. Normal compressibility, respiratory phasicity and response to augmentation. Other Findings:  None visualized. IMPRESSION: Sonographic survey of the right upper extremity negative for  DVT. Signed, Dulcy Fanny. Earleen Newport, DO [facility] Radiology Electronically Signed   By: Corrie Mckusick D.O.   On: 08/20/2017 12:42   US Arterial Abi (screening Lower Extremity)  Result Date: 09/07/2017 CLINICAL DATA:  Bilateral lower extremity ulcerations, previous tobacco abuse, hypertension, hyperlipidemia EXAM: NONINVASIVE PHYSIOLOGIC VASCULAR STUDY OF BILATERAL LOWER EXTREMITIES TECHNIQUE: Evaluation of both lower extremities were performed at rest, including calculation of ankle-brachial indices with single level Doppler, pressure recording. COMPARISON:  None. FINDINGS: Right ABI:  1.0 Left ABI:  1.21 Right Lower Extremity: Monophasic arterial waveforms are recorded distally. Left Lower Extremity:  Monophasic waveforms recorded distally IMPRESSION: No evidence of hemodynamically significant lower extremity arterial occlusive disease at rest. Electronically Signed   By: Lucrezia Europe M.D.   On: 09/07/2017 16:57   Dg Chest Port 1 View  Result Date: 09/06/2017 CLINICAL DATA:  Shortness of breath, fever. EXAM: PORTABLE CHEST 1 VIEW COMPARISON:  Radiograph and CT scan of August 21, 2017. FINDINGS: Stable cardiomediastinal silhouette. Tracheostomy tube is in grossly good position. Atherosclerosis of thoracic aorta is noted. No pneumothorax is noted. Right lung is clear. Left basilar opacity is noted consistent with pneumonia. Bony thorax is unremarkable. IMPRESSION: Continued left basilar opacity is noted consistent with pneumonia. Associated pleural effusion cannot be excluded. Aortic Atherosclerosis (ICD10-I70.0). Electronically Signed   By: Marijo Conception, M.D.   On: 09/06/2017 08:59   Dg  Chest Port 1 View  Result Date: 08/21/2017 CLINICAL DATA:  Respiratory failure, CVA, sepsis EXAM: PORTABLE CHEST 1 VIEW COMPARISON:  Portable chest x-ray of August 20, 2017 FINDINGS: The lungs are adequately inflated. There is dense infiltrate in the left lower lobe. There  remains obscuration of the left hemidiaphragm. The heart is normal in size. The pulmonary vascularity is not engorged. The endotracheal tube tip projects at the inferior margin of the clavicular heads. There calcification in the wall of the aortic arch. IMPRESSION: Left lower lobe atelectasis and/or pneumonia more conspicuous today. Small left pleural effusion. Thoracic aortic atherosclerosis. Electronically Signed   By: Nickey  Martinique M.D.   On: 08/21/2017 06:54   Dg Chest Port 1 View  Result Date: 08/20/2017 CLINICAL DATA:  Acute respiratory failure EXAM: PORTABLE CHEST 1 VIEW COMPARISON:  08/19/2017 FINDINGS: Cardiac shadow is stable. Aortic calcifications are again seen. Tracheostomy tube is again seen and stable. Lungs are well aerated with the exception of persistent left retrocardiac density. The overall appearance however has improved when compare with the prior exam. IMPRESSION: Persistent but significantly improved left retrocardiac density. Electronically Signed   By: Inez Catalina M.D.   On: 08/20/2017 07:09   Dg Chest Port 1 View  Result Date: 08/19/2017 CLINICAL DATA:  Follow-up healthcare associated pneumonia, sepsis EXAM: PORTABLE CHEST 1 VIEW COMPARISON:  08/18/2017 FINDINGS: Moderate left pleural effusion, mildly increased. Associated left lower lobe opacity, favoring compressive atelectasis, pneumonia not excluded. Mild left suprahilar opacity, new, favoring asymmetric interstitial edema given the rapid change. Right lung is essentially clear. No pneumothorax. Tracheostomy in satisfactory position. The heart is top-normal in size. IMPRESSION: Mild left suprahilar opacity, new, favoring asymmetric interstitial edema given the rapid change. Moderate left pleural effusion, increased. Associated left lower lobe opacity, favoring compressive atelectasis, pneumonia not excluded. Electronically Signed   By: Julian Hy M.D.   On: 08/19/2017 07:14   Dg Chest Portable 1 View  Result Date:  08/18/2017 CLINICAL DATA:  Chronic ventilator dependent respiratory failure, presenting with fever, tachycardia, tachypnea and hypoxia. Personal history of stroke. EXAM: PORTABLE CHEST 1 VIEW COMPARISON:  07/22/2017, 11/23/2016 and earlier. FINDINGS: Tracheostomy tube tip in satisfactory position below the thoracic inlet. Cardiac silhouette upper normal in size for AP portable technique, unchanged. Dense airspace consolidation involving the left lower lobe. Lungs otherwise clear. 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 IMPRESSION: 1. Dense left lower lobe pneumonia and/or atelectasis. 2. 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 Electronically Signed   By: Evangeline Dakin M.D.   On: 08/18/2017 22:15   US Abdomen Limited Ruq  Result Date: 08/19/2017 CLINICAL DATA:  Elevated LFTs EXAM: ULTRASOUND ABDOMEN LIMITED RIGHT UPPER QUADRANT COMPARISON:  CT abdomen/pelvis dated 08/02/2017 FINDINGS: Gallbladder: Layering gallbladder sludge. No gallbladder wall thickening or pericholecystic fluid. Common bile duct: Diameter: 3 mm Liver: No focal lesion identified. Within normal limits in parenchymal echogenicity. Portal vein is patent on color Doppler imaging with normal direction of blood flow towards the liver. IMPRESSION: Layering gallbladder sludge, without associated inflammatory changes. Electronically Signed   By: Julian Hy M.D.   On: 08/19/2017 11:27    Microbiology: Recent Results (from the past 240 hour(s))  Blood Culture (routine x 2)     Status: None   Collection Time: 09/06/17  8:42 AM  Result Value Ref Range Status   Specimen Description BLOOD BLOOD LEFT ARM DRAWN BY RN  Final   Special Requests   Final    BOTTLES DRAWN AEROBIC AND ANAEROBIC Blood Culture  adequate volume   Culture   Final    NO GROWTH 5 DAYS Performed at [facility], [address] [place], [place] [address]    Report Status 09/11/2017 FINAL  Final  Blood Culture (routine x 2)     Status:  None   Collection Time: 09/06/17  8:43 AM  Result Value Ref Range Status   Specimen Description BLOOD LEFT ANTECUBITAL DRAWN BY RN  Final   Special Requests   Final    BOTTLES DRAWN AEROBIC AND ANAEROBIC Blood Culture adequate volume   Culture   Final    NO GROWTH 5 DAYS Performed at [facility], [address] [place], [place] [address]    Report Status 09/11/2017 FINAL  Final  Culture, sputum-assessment     Status: None   Collection Time: 09/06/17  6:00 PM  Result Value Ref Range Status   Specimen Description EXPECTORATED SPUTUM  Final   Special Requests NONE  Final   Sputum evaluation   Final    THIS SPECIMEN IS ACCEPTABLE FOR SPUTUM CULTURE Performed at [facility], [address] [place], [place] [address]    Report Status 09/07/2017 FINAL  Final  Culture, respiratory (NON-Expectorated)     Status: None   Collection Time: 09/06/17  6:00 PM  Result Value Ref Range Status   Specimen Description   Final    EXPECTORATED SPUTUM Performed at [facility], [address] [place], [place] [address]    Special Requests   Final    NONE Reflexed from [id number] Performed at [facility], [address] [place], [place] [address]    Gram Stain   Final    ABUNDANT WBC PRESENT, PREDOMINANTLY PMN ABUNDANT GRAM POSITIVE RODS    Culture   Final    Consistent with normal respiratory flora. Performed at [facility], [place] [address], [place],  [address]    Report Status 09/09/2017 FINAL  Final     Labs: Basic Metabolic Panel: Recent Labs  Lab 09/06/17 0840 09/07/17 1202 09/07/17 1750 09/08/17 0442 09/08/17 1719 09/09/17 0414 09/11/17 0452  NA 149*  --   --  150*  --  149* 139  K 3.6  --   --  2.7*  --  3.8 3.1*  CL 113*  --   --  115*  --  117* 108  CO2 25  --   --  25  --  23 24  GLUCOSE 118*  --   --  114*  --  117* 124*  BUN 37*  --   --  42*  --  39* 30*  CREATININE 0.51*  --   --  0.30*  --  0.33* <0.30*  CALCIUM 8.7*  --   --  8.3*  --  8.2* 8.4*  MG   --  1.9 1.9 2.0 1.8  --   --   PHOS  --  3.6 2.5 2.7 2.4*  --   --    Liver Function Tests: Recent Labs  Lab 09/06/17 0840  AST 37  ALT 57  ALKPHOS 301*  BILITOT 1.0  PROT 7.4  ALBUMIN 2.2*   CBC: Recent Labs  Lab 09/06/17 0840 09/08/17 0442  WBC 15.5* 10.0  NEUTROABS 13.2  --   HGB 10.9* 9.1*  HCT 37.9* 32.3*  MCV 86.5 87.5  PLT 321 235   Cardiac Enzymes: Recent Labs  Lab 09/06/17 0840  TROPONINI 0.04*   BNP: BNP (last 3 results) Recent Labs    08/20/17  0405 09/06/17 0840  BNP 66.0 70.0   CBG: Recent Labs  Lab 09/10/17 1937 09/11/17 0000 09/11/17 0452 09/11/17 0755 09/11/17 1118  GLUCAP 98 105* 109* 107* 127*    Signed:  Barton Dubois MD.  [facility] 09/11/2017, 3:05 PM

## 2017-09-11 NOTE — Care Management (Addendum)
Patient's room number at [facility] will be 205.  Accepting MD Is Dr. Julio Sicks.  RN to call update to [phone]. MD notified to complete EMTALA. Mrs. Ballester updated.

## 2017-09-11 NOTE — Care Management Note (Signed)
Case Management Note  Patient Details  Name: Richard Mcpherson MRN: [id number] Date of Birth: 09/29/1940  If discussed at Long Length of Stay Meetings, dates discussed:   09/11/2017  Additional Comments:  Fairy Ashlock, Chrystine Oiler, RN 09/11/2017, 4:18 PM

## 2017-09-11 NOTE — Clinical Social Work Note (Signed)
Following for dc planning. Spoke with pt's wife today to further discuss dc options. At this time, she requests referral to LTAC due to pt's wound care needs. Discussed with RN CM, Aviva Signs, who will work on the referral. Will follow up tomorrow to further assist with dc planning if needed.

## 2017-09-11 NOTE — Progress Notes (Signed)
[facility] arrived to transport Pt to [facility]. VS all stable. Report has already been called to [facility]. Pt belongings sent with [facility]. Pt wife following behind [facility].

## 2017-09-11 NOTE — Care Management Note (Signed)
Case Management Note  Patient Details  Name: SHERWIN ECKRICH MRN: [id number] Date of Birth: 1940/10/02    Expected Discharge Date:     09/11/2017             Expected Discharge Plan:  Long Term Acute Care (LTAC)  In-House Referral:  Clinical Social Work, Hospice / Palliative Care  Discharge planning Services  CM Consult  Post Acute Care Choice:    Choice offered to:  [facility] POA / Guardian  DME Arranged:    DME Agency:     HH Arranged:    HH Agency:     Status of Service:  Completed, signed off  If discussed at [employer], dates discussed:    Additional Comments: CSW has been working with patient on return to [facility]/new SNF placement. Wife extemely unhappy with progression of wound at current SNF. She was interested in [facility] referral for wound care. Referral sent to [facility]. They have made a bed offer. Patient will need to transfer today. Wife Agrees. RN and MD notified.   Loi Rennaker, Chrystine Oiler, RN 09/11/2017, 2:37 PM

## 2017-10-15 DIAGNOSIS — Z8673 Personal history of transient ischemic attack (TIA), and cerebral infarction without residual deficits: Secondary | ICD-10-CM | POA: Diagnosis not present

## 2017-10-15 DIAGNOSIS — J181 Lobar pneumonia, unspecified organism: Secondary | ICD-10-CM | POA: Diagnosis not present

## 2017-10-15 DIAGNOSIS — A412 Sepsis due to unspecified staphylococcus: Secondary | ICD-10-CM | POA: Diagnosis not present

## 2017-10-15 DIAGNOSIS — M6281 Muscle weakness (generalized): Secondary | ICD-10-CM | POA: Diagnosis not present

## 2017-10-15 DIAGNOSIS — J398 Other specified diseases of upper respiratory tract: Secondary | ICD-10-CM | POA: Diagnosis not present

## 2017-10-15 DIAGNOSIS — E43 Unspecified severe protein-calorie malnutrition: Secondary | ICD-10-CM | POA: Diagnosis not present

## 2017-10-15 DIAGNOSIS — L97929 Non-pressure chronic ulcer of unspecified part of left lower leg with unspecified severity: Secondary | ICD-10-CM | POA: Diagnosis not present

## 2017-10-15 DIAGNOSIS — J156 Pneumonia due to other aerobic Gram-negative bacteria: Secondary | ICD-10-CM | POA: Diagnosis not present

## 2017-10-15 DIAGNOSIS — L89154 Pressure ulcer of sacral region, stage 4: Secondary | ICD-10-CM | POA: Diagnosis not present

## 2017-10-15 DIAGNOSIS — G20 Parkinson's disease: Secondary | ICD-10-CM | POA: Diagnosis not present

## 2017-10-15 DIAGNOSIS — J449 Chronic obstructive pulmonary disease, unspecified: Secondary | ICD-10-CM | POA: Diagnosis not present

## 2017-10-15 DIAGNOSIS — R509 Fever, unspecified: Secondary | ICD-10-CM | POA: Diagnosis not present

## 2017-10-15 DIAGNOSIS — M1990 Unspecified osteoarthritis, unspecified site: Secondary | ICD-10-CM | POA: Diagnosis not present

## 2017-10-15 DIAGNOSIS — J189 Pneumonia, unspecified organism: Secondary | ICD-10-CM | POA: Diagnosis not present

## 2017-10-15 DIAGNOSIS — L89150 Pressure ulcer of sacral region, unstageable: Secondary | ICD-10-CM | POA: Diagnosis not present

## 2017-10-15 DIAGNOSIS — L89309 Pressure ulcer of unspecified buttock, unspecified stage: Secondary | ICD-10-CM | POA: Diagnosis not present

## 2017-10-15 DIAGNOSIS — J441 Chronic obstructive pulmonary disease with (acute) exacerbation: Secondary | ICD-10-CM | POA: Diagnosis not present

## 2017-10-15 DIAGNOSIS — L97519 Non-pressure chronic ulcer of other part of right foot with unspecified severity: Secondary | ICD-10-CM | POA: Diagnosis not present

## 2017-10-15 DIAGNOSIS — A419 Sepsis, unspecified organism: Secondary | ICD-10-CM | POA: Diagnosis not present

## 2017-10-15 DIAGNOSIS — I639 Cerebral infarction, unspecified: Secondary | ICD-10-CM | POA: Diagnosis not present

## 2017-10-15 DIAGNOSIS — R918 Other nonspecific abnormal finding of lung field: Secondary | ICD-10-CM | POA: Diagnosis not present

## 2017-10-15 DIAGNOSIS — R0989 Other specified symptoms and signs involving the circulatory and respiratory systems: Secondary | ICD-10-CM | POA: Diagnosis not present

## 2017-10-15 DIAGNOSIS — D72829 Elevated white blood cell count, unspecified: Secondary | ICD-10-CM | POA: Diagnosis not present

## 2017-10-15 DIAGNOSIS — R1312 Dysphagia, oropharyngeal phase: Secondary | ICD-10-CM | POA: Diagnosis not present

## 2017-10-15 DIAGNOSIS — R600 Localized edema: Secondary | ICD-10-CM | POA: Diagnosis not present

## 2017-10-15 DIAGNOSIS — E785 Hyperlipidemia, unspecified: Secondary | ICD-10-CM | POA: Diagnosis not present

## 2017-10-15 DIAGNOSIS — J9621 Acute and chronic respiratory failure with hypoxia: Secondary | ICD-10-CM | POA: Diagnosis not present

## 2017-10-15 DIAGNOSIS — E559 Vitamin D deficiency, unspecified: Secondary | ICD-10-CM | POA: Diagnosis not present

## 2017-10-15 DIAGNOSIS — R5381 Other malaise: Secondary | ICD-10-CM | POA: Diagnosis not present

## 2017-10-15 DIAGNOSIS — I10 Essential (primary) hypertension: Secondary | ICD-10-CM | POA: Diagnosis not present

## 2017-10-15 DIAGNOSIS — Z431 Encounter for attention to gastrostomy: Secondary | ICD-10-CM | POA: Diagnosis not present

## 2017-10-15 DIAGNOSIS — Z86718 Personal history of other venous thrombosis and embolism: Secondary | ICD-10-CM | POA: Diagnosis not present

## 2017-10-15 DIAGNOSIS — N400 Enlarged prostate without lower urinary tract symptoms: Secondary | ICD-10-CM | POA: Diagnosis not present

## 2017-10-15 DIAGNOSIS — J9600 Acute respiratory failure, unspecified whether with hypoxia or hypercapnia: Secondary | ICD-10-CM | POA: Diagnosis not present

## 2017-10-15 DIAGNOSIS — R062 Wheezing: Secondary | ICD-10-CM | POA: Diagnosis not present

## 2017-10-15 DIAGNOSIS — R279 Unspecified lack of coordination: Secondary | ICD-10-CM | POA: Diagnosis not present

## 2017-10-15 DIAGNOSIS — R6520 Severe sepsis without septic shock: Secondary | ICD-10-CM | POA: Diagnosis not present

## 2017-10-15 DIAGNOSIS — J9611 Chronic respiratory failure with hypoxia: Secondary | ICD-10-CM | POA: Diagnosis not present

## 2017-10-15 DIAGNOSIS — R293 Abnormal posture: Secondary | ICD-10-CM | POA: Diagnosis not present

## 2017-10-15 DIAGNOSIS — E46 Unspecified protein-calorie malnutrition: Secondary | ICD-10-CM | POA: Diagnosis not present

## 2017-10-15 DIAGNOSIS — J984 Other disorders of lung: Secondary | ICD-10-CM | POA: Diagnosis not present

## 2017-10-15 DIAGNOSIS — I69391 Dysphagia following cerebral infarction: Secondary | ICD-10-CM | POA: Diagnosis not present

## 2017-10-15 DIAGNOSIS — Z430 Encounter for attention to tracheostomy: Secondary | ICD-10-CM | POA: Diagnosis not present

## 2017-10-15 DIAGNOSIS — M2560 Stiffness of unspecified joint, not elsewhere classified: Secondary | ICD-10-CM | POA: Diagnosis not present

## 2017-10-15 DIAGNOSIS — F329 Major depressive disorder, single episode, unspecified: Secondary | ICD-10-CM | POA: Diagnosis not present

## 2017-10-16 DIAGNOSIS — J189 Pneumonia, unspecified organism: Secondary | ICD-10-CM | POA: Diagnosis not present

## 2017-10-16 DIAGNOSIS — F329 Major depressive disorder, single episode, unspecified: Secondary | ICD-10-CM | POA: Diagnosis not present

## 2017-10-16 DIAGNOSIS — J449 Chronic obstructive pulmonary disease, unspecified: Secondary | ICD-10-CM | POA: Diagnosis not present

## 2017-10-16 DIAGNOSIS — J9611 Chronic respiratory failure with hypoxia: Secondary | ICD-10-CM | POA: Diagnosis not present

## 2017-10-19 DIAGNOSIS — E559 Vitamin D deficiency, unspecified: Secondary | ICD-10-CM | POA: Diagnosis not present

## 2017-10-24 DIAGNOSIS — J9611 Chronic respiratory failure with hypoxia: Secondary | ICD-10-CM | POA: Diagnosis not present

## 2017-10-24 DIAGNOSIS — F329 Major depressive disorder, single episode, unspecified: Secondary | ICD-10-CM | POA: Diagnosis not present

## 2017-10-24 DIAGNOSIS — J449 Chronic obstructive pulmonary disease, unspecified: Secondary | ICD-10-CM | POA: Diagnosis not present

## 2017-10-24 DIAGNOSIS — J189 Pneumonia, unspecified organism: Secondary | ICD-10-CM | POA: Diagnosis not present

## 2017-10-29 DIAGNOSIS — J449 Chronic obstructive pulmonary disease, unspecified: Secondary | ICD-10-CM | POA: Diagnosis not present

## 2017-10-29 DIAGNOSIS — E46 Unspecified protein-calorie malnutrition: Secondary | ICD-10-CM | POA: Diagnosis not present

## 2017-10-29 DIAGNOSIS — F329 Major depressive disorder, single episode, unspecified: Secondary | ICD-10-CM | POA: Diagnosis not present

## 2017-10-29 DIAGNOSIS — J9611 Chronic respiratory failure with hypoxia: Secondary | ICD-10-CM | POA: Diagnosis not present

## 2017-10-31 DIAGNOSIS — D72829 Elevated white blood cell count, unspecified: Secondary | ICD-10-CM | POA: Diagnosis not present

## 2017-11-01 DIAGNOSIS — J9611 Chronic respiratory failure with hypoxia: Secondary | ICD-10-CM | POA: Diagnosis not present

## 2017-11-01 DIAGNOSIS — J156 Pneumonia due to other aerobic Gram-negative bacteria: Secondary | ICD-10-CM | POA: Diagnosis not present

## 2017-11-01 DIAGNOSIS — D72829 Elevated white blood cell count, unspecified: Secondary | ICD-10-CM | POA: Diagnosis not present

## 2017-11-05 DIAGNOSIS — L89154 Pressure ulcer of sacral region, stage 4: Secondary | ICD-10-CM | POA: Diagnosis not present

## 2017-11-09 DIAGNOSIS — J9611 Chronic respiratory failure with hypoxia: Secondary | ICD-10-CM | POA: Diagnosis not present

## 2017-11-09 DIAGNOSIS — J449 Chronic obstructive pulmonary disease, unspecified: Secondary | ICD-10-CM | POA: Diagnosis not present

## 2017-11-09 DIAGNOSIS — L89154 Pressure ulcer of sacral region, stage 4: Secondary | ICD-10-CM | POA: Diagnosis not present

## 2017-11-09 DIAGNOSIS — L97929 Non-pressure chronic ulcer of unspecified part of left lower leg with unspecified severity: Secondary | ICD-10-CM | POA: Diagnosis not present

## 2017-11-12 DIAGNOSIS — F329 Major depressive disorder, single episode, unspecified: Secondary | ICD-10-CM | POA: Diagnosis not present

## 2017-11-12 DIAGNOSIS — J449 Chronic obstructive pulmonary disease, unspecified: Secondary | ICD-10-CM | POA: Diagnosis not present

## 2017-11-12 DIAGNOSIS — E46 Unspecified protein-calorie malnutrition: Secondary | ICD-10-CM | POA: Diagnosis not present

## 2017-11-12 DIAGNOSIS — J9611 Chronic respiratory failure with hypoxia: Secondary | ICD-10-CM | POA: Diagnosis not present

## 2017-11-14 DIAGNOSIS — E46 Unspecified protein-calorie malnutrition: Secondary | ICD-10-CM | POA: Diagnosis not present

## 2017-11-14 DIAGNOSIS — J449 Chronic obstructive pulmonary disease, unspecified: Secondary | ICD-10-CM | POA: Diagnosis not present

## 2017-11-14 DIAGNOSIS — L89154 Pressure ulcer of sacral region, stage 4: Secondary | ICD-10-CM | POA: Diagnosis not present

## 2017-11-14 DIAGNOSIS — J9611 Chronic respiratory failure with hypoxia: Secondary | ICD-10-CM | POA: Diagnosis not present

## 2017-11-20 DIAGNOSIS — L89154 Pressure ulcer of sacral region, stage 4: Secondary | ICD-10-CM | POA: Diagnosis not present

## 2017-11-20 DIAGNOSIS — E46 Unspecified protein-calorie malnutrition: Secondary | ICD-10-CM | POA: Diagnosis not present

## 2017-11-20 DIAGNOSIS — E559 Vitamin D deficiency, unspecified: Secondary | ICD-10-CM | POA: Diagnosis not present

## 2017-11-20 DIAGNOSIS — J449 Chronic obstructive pulmonary disease, unspecified: Secondary | ICD-10-CM | POA: Diagnosis not present

## 2017-12-12 DIAGNOSIS — L89154 Pressure ulcer of sacral region, stage 4: Secondary | ICD-10-CM | POA: Diagnosis not present

## 2017-12-12 DIAGNOSIS — J449 Chronic obstructive pulmonary disease, unspecified: Secondary | ICD-10-CM | POA: Diagnosis not present

## 2017-12-12 DIAGNOSIS — E46 Unspecified protein-calorie malnutrition: Secondary | ICD-10-CM | POA: Diagnosis not present

## 2017-12-12 DIAGNOSIS — F329 Major depressive disorder, single episode, unspecified: Secondary | ICD-10-CM | POA: Diagnosis not present

## 2017-12-18 DIAGNOSIS — L97929 Non-pressure chronic ulcer of unspecified part of left lower leg with unspecified severity: Secondary | ICD-10-CM | POA: Diagnosis not present

## 2017-12-18 DIAGNOSIS — L97519 Non-pressure chronic ulcer of other part of right foot with unspecified severity: Secondary | ICD-10-CM | POA: Diagnosis not present

## 2017-12-18 DIAGNOSIS — L89154 Pressure ulcer of sacral region, stage 4: Secondary | ICD-10-CM | POA: Diagnosis not present

## 2017-12-18 DIAGNOSIS — J449 Chronic obstructive pulmonary disease, unspecified: Secondary | ICD-10-CM | POA: Diagnosis not present

## 2017-12-24 DIAGNOSIS — J9611 Chronic respiratory failure with hypoxia: Secondary | ICD-10-CM | POA: Diagnosis not present

## 2017-12-24 DIAGNOSIS — J398 Other specified diseases of upper respiratory tract: Secondary | ICD-10-CM | POA: Diagnosis not present

## 2017-12-24 DIAGNOSIS — R509 Fever, unspecified: Secondary | ICD-10-CM | POA: Diagnosis not present

## 2017-12-24 DIAGNOSIS — J449 Chronic obstructive pulmonary disease, unspecified: Secondary | ICD-10-CM | POA: Diagnosis not present

## 2017-12-31 ENCOUNTER — Non-Acute Institutional Stay: Payer: Medicare Other | Admitting: Hospice and Palliative Medicine

## 2017-12-31 DIAGNOSIS — Z515 Encounter for palliative care: Secondary | ICD-10-CM

## 2017-12-31 NOTE — Progress Notes (Signed)
Patient is no longer at [facility]. Will DC from services.

## 2018-01-01 DIAGNOSIS — J449 Chronic obstructive pulmonary disease, unspecified: Secondary | ICD-10-CM | POA: Diagnosis not present

## 2018-01-01 DIAGNOSIS — E46 Unspecified protein-calorie malnutrition: Secondary | ICD-10-CM | POA: Diagnosis not present

## 2018-01-01 DIAGNOSIS — J189 Pneumonia, unspecified organism: Secondary | ICD-10-CM | POA: Diagnosis not present

## 2018-01-01 DIAGNOSIS — E559 Vitamin D deficiency, unspecified: Secondary | ICD-10-CM | POA: Diagnosis not present

## 2018-01-08 DIAGNOSIS — J449 Chronic obstructive pulmonary disease, unspecified: Secondary | ICD-10-CM | POA: Diagnosis not present

## 2018-01-08 DIAGNOSIS — E559 Vitamin D deficiency, unspecified: Secondary | ICD-10-CM | POA: Diagnosis not present

## 2018-01-08 DIAGNOSIS — E46 Unspecified protein-calorie malnutrition: Secondary | ICD-10-CM | POA: Diagnosis not present

## 2018-01-08 DIAGNOSIS — J9611 Chronic respiratory failure with hypoxia: Secondary | ICD-10-CM | POA: Diagnosis not present

## 2018-01-11 DIAGNOSIS — J9611 Chronic respiratory failure with hypoxia: Secondary | ICD-10-CM | POA: Diagnosis not present

## 2018-01-11 DIAGNOSIS — J449 Chronic obstructive pulmonary disease, unspecified: Secondary | ICD-10-CM | POA: Diagnosis not present

## 2018-01-11 DIAGNOSIS — L89154 Pressure ulcer of sacral region, stage 4: Secondary | ICD-10-CM | POA: Diagnosis not present

## 2018-01-11 DIAGNOSIS — E46 Unspecified protein-calorie malnutrition: Secondary | ICD-10-CM | POA: Diagnosis not present

## 2018-01-18 DIAGNOSIS — J9611 Chronic respiratory failure with hypoxia: Secondary | ICD-10-CM | POA: Diagnosis not present

## 2018-01-18 DIAGNOSIS — J449 Chronic obstructive pulmonary disease, unspecified: Secondary | ICD-10-CM | POA: Diagnosis not present

## 2018-01-18 DIAGNOSIS — E46 Unspecified protein-calorie malnutrition: Secondary | ICD-10-CM | POA: Diagnosis not present

## 2018-01-18 DIAGNOSIS — L89154 Pressure ulcer of sacral region, stage 4: Secondary | ICD-10-CM | POA: Diagnosis not present

## 2018-01-21 DIAGNOSIS — Z7401 Bed confinement status: Secondary | ICD-10-CM | POA: Diagnosis not present

## 2018-01-21 DIAGNOSIS — R279 Unspecified lack of coordination: Secondary | ICD-10-CM | POA: Diagnosis not present

## 2018-01-28 ENCOUNTER — Emergency Department (HOSPITAL_COMMUNITY): Payer: Medicare Other

## 2018-01-28 ENCOUNTER — Encounter (HOSPITAL_COMMUNITY): Payer: Self-pay | Admitting: Emergency Medicine

## 2018-01-28 ENCOUNTER — Other Ambulatory Visit: Payer: Self-pay

## 2018-01-28 ENCOUNTER — Emergency Department (HOSPITAL_COMMUNITY)
Admission: EM | Admit: 2018-01-28 | Discharge: 2018-01-28 | Disposition: A | Discharge status: Home / Self Care | Payer: Medicare Other | Attending: Emergency Medicine | Admitting: Emergency Medicine

## 2018-01-28 DIAGNOSIS — R000 Tachycardia, unspecified: Secondary | ICD-10-CM | POA: Insufficient documentation

## 2018-01-28 DIAGNOSIS — R197 Diarrhea, unspecified: Secondary | ICD-10-CM

## 2018-01-28 DIAGNOSIS — I10 Essential (primary) hypertension: Secondary | ICD-10-CM | POA: Diagnosis not present

## 2018-01-28 DIAGNOSIS — L98493 Non-pressure chronic ulcer of skin of other sites with necrosis of muscle: Secondary | ICD-10-CM | POA: Diagnosis not present

## 2018-01-28 DIAGNOSIS — I2510 Atherosclerotic heart disease of native coronary artery without angina pectoris: Secondary | ICD-10-CM | POA: Insufficient documentation

## 2018-01-28 DIAGNOSIS — F17220 Nicotine dependence, chewing tobacco, uncomplicated: Secondary | ICD-10-CM | POA: Diagnosis not present

## 2018-01-28 DIAGNOSIS — G20 Parkinson's disease: Secondary | ICD-10-CM | POA: Diagnosis not present

## 2018-01-28 DIAGNOSIS — R0902 Hypoxemia: Secondary | ICD-10-CM | POA: Diagnosis not present

## 2018-01-28 DIAGNOSIS — Z8673 Personal history of transient ischemic attack (TIA), and cerebral infarction without residual deficits: Secondary | ICD-10-CM | POA: Diagnosis not present

## 2018-01-28 DIAGNOSIS — Z930 Tracheostomy status: Secondary | ICD-10-CM | POA: Insufficient documentation

## 2018-01-28 DIAGNOSIS — R05 Cough: Secondary | ICD-10-CM | POA: Diagnosis not present

## 2018-01-28 DIAGNOSIS — R509 Fever, unspecified: Secondary | ICD-10-CM | POA: Diagnosis not present

## 2018-01-28 DIAGNOSIS — R059 Cough, unspecified: Secondary | ICD-10-CM

## 2018-01-28 DIAGNOSIS — L98423 Non-pressure chronic ulcer of back with necrosis of muscle: Secondary | ICD-10-CM | POA: Insufficient documentation

## 2018-01-28 DIAGNOSIS — Z743 Need for continuous supervision: Secondary | ICD-10-CM | POA: Diagnosis not present

## 2018-01-28 DIAGNOSIS — R402441 Other coma, without documented Glasgow coma scale score, or with partial score reported, in the field [EMT or ambulance]: Secondary | ICD-10-CM | POA: Diagnosis not present

## 2018-01-28 DIAGNOSIS — R279 Unspecified lack of coordination: Secondary | ICD-10-CM | POA: Diagnosis not present

## 2018-01-28 LAB — COMPREHENSIVE METABOLIC PANEL
ALT: 24 U/L (ref 0–44)
ANION GAP: 6 (ref 5–15)
AST: 32 U/L (ref 15–41)
Albumin: 2.5 g/dL — ABNORMAL LOW (ref 3.5–5.0)
Alkaline Phosphatase: 224 U/L — ABNORMAL HIGH (ref 38–126)
BILIRUBIN TOTAL: 0.5 mg/dL (ref 0.3–1.2)
BUN: 23 mg/dL (ref 8–23)
CHLORIDE: 98 mmol/L (ref 98–111)
CO2: 27 mmol/L (ref 22–32)
Calcium: 8.5 mg/dL — ABNORMAL LOW (ref 8.9–10.3)
Creatinine, Ser: 0.48 mg/dL — ABNORMAL LOW (ref 0.61–1.24)
GFR calc Af Amer: >60 mL/min (ref >60)
Glucose, Bld: 102 mg/dL — ABNORMAL HIGH (ref 70–99)
POTASSIUM: 3.6 mmol/L (ref 3.5–5.1)
Sodium: 131 mmol/L — ABNORMAL LOW (ref 135–145)
TOTAL PROTEIN: 7.7 g/dL (ref 6.5–8.1)

## 2018-01-28 LAB — URINALYSIS, ROUTINE W REFLEX MICROSCOPIC
Bilirubin Urine: NEGATIVE
Glucose, UA: NEGATIVE mg/dL
Ketones, ur: 5 mg/dL — AB
Leukocytes, UA: NEGATIVE
NITRITE: NEGATIVE
PH: 6 (ref 5.0–8.0)
Protein, ur: 30 mg/dL — AB
Specific Gravity, Urine: 1.018 (ref 1.005–1.030)

## 2018-01-28 LAB — POC OCCULT BLOOD, ED: Fecal Occult Bld: POSITIVE — AB

## 2018-01-28 LAB — CBC WITH DIFFERENTIAL/PLATELET
BASOS ABS: 0 10*3/uL (ref 0.0–0.1)
Basophils Relative: 0 %
Eosinophils Absolute: 0.2 10*3/uL (ref 0.0–0.7)
Eosinophils Relative: 1 %
HEMATOCRIT: 34.2 % — AB (ref 39.0–52.0)
HEMOGLOBIN: 10.3 g/dL — AB (ref 13.0–17.0)
LYMPHS PCT: 6 %
Lymphs Abs: 1.1 10*3/uL (ref 0.7–4.0)
MCH: 23.8 pg — ABNORMAL LOW (ref 26.0–34.0)
MCHC: 30.1 g/dL (ref 30.0–36.0)
MCV: 79 fL (ref 78.0–100.0)
Monocytes Absolute: 1 10*3/uL (ref 0.1–1.0)
Monocytes Relative: 5 %
NEUTROS ABS: 16.7 10*3/uL — AB (ref 1.7–7.7)
NEUTROS PCT: 88 %
PLATELETS: 314 10*3/uL (ref 150–400)
RBC: 4.33 MIL/uL (ref 4.22–5.81)
RDW: 18.6 % — ABNORMAL HIGH (ref 11.5–15.5)
WBC: 19 10*3/uL — AB (ref 4.0–10.5)

## 2018-01-28 LAB — LACTIC ACID, PLASMA
LACTIC ACID, VENOUS: 1.5 mmol/L (ref 0.5–1.9)
LACTIC ACID, VENOUS: 1 mmol/L (ref 0.5–1.9)

## 2018-01-28 LAB — C DIFFICILE QUICK SCREEN W PCR REFLEX
C Diff antigen: NEGATIVE
C Diff interpretation: NOT DETECTED
C Diff toxin: NEGATIVE

## 2018-01-28 MED ORDER — PSYLLIUM 58.6 % PO POWD: 1.0000 | Freq: Three times a day (TID) | ORAL | 12 refills | Status: DC

## 2018-01-28 MED ORDER — LACTATED RINGERS IV BOLUS
1000.0000 mL | Freq: Once | INTRAVENOUS | Status: AC
  Administered 2018-01-28: 1000 mL via INTRAVENOUS

## 2018-01-28 NOTE — ED Provider Notes (Signed)
Emergency Department Provider Note   I have reviewed the triage vital signs and the nursing notes.   HISTORY  Chief Complaint Diarrhea   HPI Richard Mcpherson is a 77 y.o. male with multiple medical problems and very complex recent medical history best summarized as having had a fall with multiple neck fractures back in November and subsequently being admitted to hospital multiple times and skilled nursing facilities multiple times for pneumonia, infected ulcers and dehydration most recently been discharged home approximately 6 days ago.  Since being home his wife is been taking care of him and has been doing well.  The patient's wounds are all improving.  Patient has been afebrile with an improving cough.  No changes in his respiratory status.  However this morning the patient started having diarrhea and he has multiple bedsores so wife is concerned about how to best keep a diary out of the bedsores.  She is been trying to get help at home which is been difficult to do she is been trying to follow with her doctor to try to get hospice or some other to help of health care but this is not worked so far. No other associated or modifying symptoms.    Past Medical History:  Diagnosis Date  . Atrial premature beats   . Blood in stool   . BPH (benign prostatic hyperplasia)   . Cerebral aneurysm   . Coronary atherosclerosis of native coronary artery   . Depression   . Embolism (HCC)    History of right lower extremity distal embolism   . Gout   . Hyperglycemia   . Hyperlipidemia   . Hyperlipidemia, mixed   . Osteoarthritis   . Parkinson disease (HCC)   . Plantar fasciitis   . Pneumonia   . Pressure ulcer of sacral region   . Protein calorie malnutrition (HCC)   . Sepsis (HCC)   . Sinusitis   . Stroke (HCC)   . Subarachnoid hemorrhage (HCC)   . Tracheostomy dependent (HCC)   . Unspecified essential hypertension     Patient Active Problem List   Diagnosis Date Noted  .  Palliative care by specialist   . Counseling regarding advanced care planning and goals of care   . HCAP (healthcare-associated pneumonia) 09/06/2017  . Acute respiratory failure with hypoxemia (HCC)   . Palliative care encounter   . Severe protein-calorie malnutrition (HCC) 08/21/2017  . Decubitus ulcer of sacral region, stage 3 (HCC) 08/20/2017  . Staphylococcal sepsis (HCC) 08/20/2017  . Healthcare-associated pneumonia   . Acute on chronic respiratory failure with hypoxia (HCC) 08/19/2017  . Sepsis due to undetermined organism (HCC) 08/19/2017  . Aspiration pneumonia of both lower lobes due to gastric secretions (HCC) 08/19/2017  . Tracheostomy status (HCC) 08/19/2017  . Deep vein thrombosis (DVT) of right upper extremity (HCC) 08/19/2017  . Lobar pneumonia (HCC) 08/19/2017  . Transaminasemia   . Sepsis (HCC) 08/18/2017  . Pneumonia 08/18/2017  . Acute hypoxemic respiratory failure (HCC) 08/18/2017  . 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 Stroke (cerebrum) (HCC) 04/18/2017  . SAH (subarachnoid hemorrhage) (HCC) 04/18/2017  . 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 Hyperlipidemia 03/25/2009  . Essential hypertension 03/25/2009  . CAD, NATIVE VESSEL 03/25/2009  . PALPITATIONS 03/25/2009    Past Surgical History:  Procedure Laterality Date  . APPENDECTOMY    . cardiac catherization    . CATARACT EXTRACTION W/PHACO Right 02/19/2014   Procedure: CATARACT EXTRACTION PHACO  AND INTRAOCULAR LENS PLACEMENT (IOC);  Surgeon: Gemma Payor, MD;  Location: [facility];  Service: Ophthalmology;  Laterality: Right;  CDE 12.71  . CATARACT EXTRACTION W/PHACO Left 12/28/2014   Procedure: CATARACT EXTRACTION PHACO AND INTRAOCULAR LENS PLACEMENT (IOC);  Surgeon: Gemma Payor, MD;  Location: [facility];  Service: Ophthalmology;  Laterality: Left;  CDE:8.05  . embolectomy 2000    . IR GASTROSTOMY TUBE MOD SED  08/06/2017    Current Outpatient Rx    . Order #: [id number] Class: Historical Med  . Order #: [id number] Class: Historical Med  . Order #: [id number] Class: Historical Med  . Order #: [id number] Class: Historical Med  . Order #: [id number] Class: Historical Med  . Order #: [id number] Class: Historical Med  . Order #: [id number] Class: No Print  . Order #: [id number] Class: Historical Med  . Order #: [id number] Class: Historical Med  . Order #: [id number] Class: No Print  . Order #: [id number] Class: Historical Med  . Order #: [id number] Class: Historical Med  . Order #: [id number] Class: Historical Med  . Order #: [id number] Class: Historical Med  . Order #: [id number] Class: Historical Med  . Order #: [id number] Class: Historical Med  . Order #: [id number] Class: Historical Med  . Order #: [id number] Class: No Print  . Order #: [id number] Class: Print    Allergies Codeine; Crestor [rosuvastatin calcium]; Lipitor [atorvastatin]; Statins; and Allopurinol  Family History  Problem Relation Age of Onset  . Cancer Mother     Social History Social History   Tobacco Use  . Smoking status: Former Smoker    Packs/day: 1.00    Years: 40.00    Pack years: 40.00  . Smokeless tobacco: Current User    Types: Snuff  Substance Use Topics  . Alcohol use: Yes    Comment: 1-2 beers every other day   . Drug use: No    Review of Systems  All other systems negative except as documented in the HPI. All pertinent positives and negatives as reviewed in the HPI. ____________________________________________   PHYSICAL EXAM:  VITAL SIGNS: ED Triage Vitals  Enc Vitals Group     BP 01/28/18 1225 116/67     Pulse Rate 01/28/18 1225 94     Resp 01/28/18 1225 (!) 22     Temp 01/28/18 1225 98.7 F (37.1 C)     Temp Source 01/28/18 1225 Oral     SpO2 01/28/18 1225 94 %     Weight 01/28/18 1226 168 lb (76.2 kg)    Constitutional: Alert but not conversant. Cachectic but overall is well appearing and in no acute distress. Eyes: Conjunctivae are normal. PERRL.  EOMI. Slightly sunken. Head: Atraumatic. Nose: No congestion/rhinnorhea. Mouth/Throat: Mucous membranes are dry.  Oropharynx non-erythematous. Neck: No stridor.  No meningeal signs.   Cardiovascular: mild tachycardia, regular rhythm. Good peripheral circulation. Grossly normal heart sounds.   Respiratory: tachypneic respiratory effort.  No retractions. Lungs diminished but clear from what I can hear. Gastrointestinal: Soft and nontender. No distention.  Rectal: grossly positive for blood but appears to be coming from open wounds around rectum rather than internally. Musculoskeletal: No lower extremity tenderness nor edema. No gross deformities of extremities. Neurologic:  Normal speech and language. No gross focal neurologic deficits are appreciated.  Skin:  Multiple bed sores: bilateral heals, bilateral calvs, large sacral ulcer. All are wrapped and well taken care of. Wound on ulcer is open but is much better than the picture she shows me on her phone without significant surrounding erythema. Has some mild erythema to bilateral lateral thighs. Also warm to  touch. ____________________________________________   LABS (all labs ordered are listed, but only abnormal results are displayed)  Labs Reviewed  COMPREHENSIVE METABOLIC PANEL - Abnormal; Notable for the following components:      Result Value   Sodium 131 (*)    Glucose, Bld 102 (*)    Creatinine, Ser 0.48 (*)    Calcium 8.5 (*)    Albumin 2.5 (*)    Alkaline Phosphatase 224 (*)    All other components within normal limits  CBC WITH DIFFERENTIAL/PLATELET - Abnormal; Notable for the following components:   WBC 19.0 (*)    Hemoglobin 10.3 (*)    HCT 34.2 (*)    MCH 23.8 (*)    RDW 18.6 (*)    Neutro Abs 16.7 (*)    All other components within normal limits  URINALYSIS, ROUTINE W REFLEX MICROSCOPIC - Abnormal; Notable for the following components:   Color, Urine AMBER (*)    APPearance CLOUDY (*)    Hgb urine dipstick MODERATE  (*)    Ketones, ur 5 (*)    Protein, ur 30 (*)    WBC, UA >50 (*)    Bacteria, UA RARE (*)    All other components within normal limits  POC OCCULT BLOOD, ED - Abnormal; Notable for the following components:   Fecal Occult Bld POSITIVE (*)    All other components within normal limits  C DIFFICILE QUICK SCREEN W PCR REFLEX  GASTROINTESTINAL PANEL BY PCR, STOOL (REPLACES STOOL CULTURE)  LACTIC ACID, PLASMA  LACTIC ACID, PLASMA   ____________________________________________  EKG   EKG Interpretation  Date/Time:    Ventricular Rate:    PR Interval:    QRS Duration:   QT Interval:    QTC Calculation:   R Axis:     Text Interpretation:         ____________________________________________  RADIOLOGY  Dg Chest Port 1 View  Result Date: 01/28/2018 CLINICAL DATA:  Cough, history coronary artery disease, essential hypertension, smoker former smoker EXAM: PORTABLE CHEST 1 VIEW COMPARISON:  Portable exam 1450 hours compared to 09/06/2017 FINDINGS: Normal heart size, mediastinal contours, and pulmonary vascularity. Subsegmental atelectasis at minor fissure. Mild chronic LEFT basilar atelectasis. Tracheostomy tube stable. Atherosclerotic calcification aorta. No definite acute infiltrate, pleural effusion or pneumothorax. Prior cervical spine fusion. IMPRESSION: Bibasilar atelectasis, new at minor fissure. Electronically Signed   By: Ulyses Southward M.D.   On: 01/28/2018 14:57    ____________________________________________   PROCEDURES  Procedure(s) performed:   Procedures   ____________________________________________   INITIAL IMPRESSION / ASSESSMENT AND PLAN / ED COURSE  No emergent causes for his diarrhea at this time.  Patient's wife is struggling to take care of him at home but does seem to be doing the job at this point.  She is willing to continue doing his own she can get some help.  Case management social work will be consulted but likely will build to contact her  till tomorrow.  Her primary doctors also help and work on some help at home versus more long-term placement.  Patient does not seem to have infection or other medical reason for admission at this time.  Will suggest increasing fiber and follow-up with primary doctor with case management help for physical therapy and home health aide and nursing for wound care as well.       Pertinent labs & imaging results that were available during my care of the patient were reviewed by me and considered in my medical decision making (  see chart for details).  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 Ensure you flush afterwards    Note:  This note was prepared with assistance of Dragon voice recognition software. Occasional wrong-word or sound-a-like substitutions may have occurred due to the inherent limitations of voice recognition software.   Marily Memos, MD 01/28/18 2201

## 2018-01-28 NOTE — ED Notes (Signed)
Family arrived and is at bedside.  

## 2018-01-28 NOTE — ED Notes (Signed)
Pt noted to be leaking stool around impaction. Bright red blood in stool. Pt has large sacral ulcer with redness on both buttocks.

## 2018-01-28 NOTE — ED Notes (Signed)
Ambulance called for transportation  This nurse and NA cleaned stool off of patient and replaced sacrum patch.

## 2018-01-28 NOTE — ED Notes (Signed)
EDP at bedside updating patient and family. 

## 2018-01-28 NOTE — ED Notes (Signed)
RT came down to evaluate pt's trach. In need of more disposable inner cannulas. AC notified.

## 2018-01-28 NOTE — ED Triage Notes (Signed)
Per EMS, family called out for multiple episodes of diarrhea and possible fever that began this morning. Pt has PEG tube and trach. Pt from home. Wife is primary caretaker.

## 2018-01-29 LAB — GASTROINTESTINAL PANEL BY PCR, STOOL (REPLACES STOOL CULTURE)
Adenovirus F40/41: NOT DETECTED
Astrovirus: NOT DETECTED
CRYPTOSPORIDIUM: NOT DETECTED
CYCLOSPORA CAYETANENSIS: NOT DETECTED
Campylobacter species: NOT DETECTED
ENTAMOEBA HISTOLYTICA: NOT DETECTED
Enteroaggregative E coli (EAEC): NOT DETECTED
Enteropathogenic E coli (EPEC): NOT DETECTED
Enterotoxigenic E coli (ETEC): NOT DETECTED
Giardia lamblia: NOT DETECTED
Norovirus GI/GII: NOT DETECTED
Plesimonas shigelloides: NOT DETECTED
Rotavirus A: NOT DETECTED
SALMONELLA SPECIES: NOT DETECTED
SAPOVIRUS (I, II, IV, AND V): NOT DETECTED
SHIGELLA/ENTEROINVASIVE E COLI (EIEC): NOT DETECTED
Shiga like toxin producing E coli (STEC): NOT DETECTED
VIBRIO CHOLERAE: NOT DETECTED
VIBRIO SPECIES: NOT DETECTED
YERSINIA ENTEROCOLITICA: NOT DETECTED

## 2018-01-29 NOTE — Care Management (Addendum)
CM consult received for [facility] care. CM contacted [name]) who says [facility] was supposed to come work with pt at home but are unwilling to do so, she does not know why. Wife is interested in Hospice services in the home and has contacted [facility]. CM left contact info for CM dept at [facility] and encouraged wife to contact us if Hospice does not work or if she needs assistance with getting HH referral to another company.   Addendum: Per [facility] they are unwilling to see pt in home d/t safety of pt being in the home. Per nursing notes. Wife is not administering or caring for patient in a safe manner (not administering tube feeds, not having medications in the home, pt unable to communicate needs or concerns, etc.) [facility] has made [facility] referral.

## 2018-02-04 DIAGNOSIS — L89154 Pressure ulcer of sacral region, stage 4: Secondary | ICD-10-CM | POA: Diagnosis present

## 2018-02-04 DIAGNOSIS — B9689 Other specified bacterial agents as the cause of diseases classified elsewhere: Secondary | ICD-10-CM | POA: Diagnosis present

## 2018-02-04 DIAGNOSIS — L89609 Pressure ulcer of unspecified heel, unspecified stage: Secondary | ICD-10-CM | POA: Diagnosis not present

## 2018-02-04 DIAGNOSIS — Z87891 Personal history of nicotine dependence: Secondary | ICD-10-CM | POA: Diagnosis not present

## 2018-02-04 DIAGNOSIS — Z931 Gastrostomy status: Secondary | ICD-10-CM | POA: Diagnosis not present

## 2018-02-04 DIAGNOSIS — R0902 Hypoxemia: Secondary | ICD-10-CM | POA: Diagnosis not present

## 2018-02-04 DIAGNOSIS — Z8673 Personal history of transient ischemic attack (TIA), and cerebral infarction without residual deficits: Secondary | ICD-10-CM | POA: Diagnosis not present

## 2018-02-04 DIAGNOSIS — B9562 Methicillin resistant Staphylococcus aureus infection as the cause of diseases classified elsewhere: Secondary | ICD-10-CM | POA: Diagnosis present

## 2018-02-04 DIAGNOSIS — R05 Cough: Secondary | ICD-10-CM | POA: Diagnosis not present

## 2018-02-04 DIAGNOSIS — B965 Pseudomonas (aeruginosa) (mallei) (pseudomallei) as the cause of diseases classified elsewhere: Secondary | ICD-10-CM | POA: Diagnosis present

## 2018-02-04 DIAGNOSIS — R4182 Altered mental status, unspecified: Secondary | ICD-10-CM | POA: Diagnosis not present

## 2018-02-04 DIAGNOSIS — Z7401 Bed confinement status: Secondary | ICD-10-CM | POA: Diagnosis not present

## 2018-02-04 DIAGNOSIS — Z7901 Long term (current) use of anticoagulants: Secondary | ICD-10-CM | POA: Diagnosis not present

## 2018-02-04 DIAGNOSIS — Z888 Allergy status to other drugs, medicaments and biological substances status: Secondary | ICD-10-CM | POA: Diagnosis not present

## 2018-02-04 DIAGNOSIS — L89610 Pressure ulcer of right heel, unstageable: Secondary | ICD-10-CM | POA: Diagnosis present

## 2018-02-04 DIAGNOSIS — R279 Unspecified lack of coordination: Secondary | ICD-10-CM | POA: Diagnosis not present

## 2018-02-04 DIAGNOSIS — G20 Parkinson's disease: Secondary | ICD-10-CM | POA: Diagnosis present

## 2018-02-04 DIAGNOSIS — N390 Urinary tract infection, site not specified: Secondary | ICD-10-CM | POA: Diagnosis present

## 2018-02-04 DIAGNOSIS — R532 Functional quadriplegia: Secondary | ICD-10-CM | POA: Diagnosis present

## 2018-02-04 DIAGNOSIS — Z885 Allergy status to narcotic agent status: Secondary | ICD-10-CM | POA: Diagnosis not present

## 2018-02-04 DIAGNOSIS — A419 Sepsis, unspecified organism: Secondary | ICD-10-CM | POA: Diagnosis present

## 2018-02-04 DIAGNOSIS — R531 Weakness: Secondary | ICD-10-CM | POA: Diagnosis not present

## 2018-02-04 DIAGNOSIS — I10 Essential (primary) hypertension: Secondary | ICD-10-CM | POA: Diagnosis present

## 2018-02-04 DIAGNOSIS — T83511A Infection and inflammatory reaction due to indwelling urethral catheter, initial encounter: Secondary | ICD-10-CM | POA: Diagnosis present

## 2018-02-04 DIAGNOSIS — L89620 Pressure ulcer of left heel, unstageable: Secondary | ICD-10-CM | POA: Diagnosis present

## 2018-02-04 DIAGNOSIS — Z930 Tracheostomy status: Secondary | ICD-10-CM | POA: Diagnosis not present

## 2018-02-04 DIAGNOSIS — Z79899 Other long term (current) drug therapy: Secondary | ICD-10-CM | POA: Diagnosis not present

## 2018-02-04 DIAGNOSIS — I959 Hypotension, unspecified: Secondary | ICD-10-CM | POA: Diagnosis not present

## 2018-02-04 DIAGNOSIS — R918 Other nonspecific abnormal finding of lung field: Secondary | ICD-10-CM | POA: Diagnosis not present

## 2018-02-04 DIAGNOSIS — B961 Klebsiella pneumoniae [K. pneumoniae] as the cause of diseases classified elsewhere: Secondary | ICD-10-CM | POA: Diagnosis present

## 2018-02-04 DIAGNOSIS — M109 Gout, unspecified: Secondary | ICD-10-CM | POA: Diagnosis present

## 2018-02-16 DIAGNOSIS — L89154 Pressure ulcer of sacral region, stage 4: Secondary | ICD-10-CM | POA: Diagnosis not present

## 2018-02-16 DIAGNOSIS — R1312 Dysphagia, oropharyngeal phase: Secondary | ICD-10-CM | POA: Diagnosis not present

## 2018-02-16 DIAGNOSIS — L89620 Pressure ulcer of left heel, unstageable: Secondary | ICD-10-CM | POA: Diagnosis not present

## 2018-02-16 DIAGNOSIS — I69391 Dysphagia following cerebral infarction: Secondary | ICD-10-CM | POA: Diagnosis not present

## 2018-02-16 DIAGNOSIS — L89892 Pressure ulcer of other site, stage 2: Secondary | ICD-10-CM | POA: Diagnosis not present

## 2018-02-16 DIAGNOSIS — L89610 Pressure ulcer of right heel, unstageable: Secondary | ICD-10-CM | POA: Diagnosis not present

## 2018-02-18 DIAGNOSIS — L89154 Pressure ulcer of sacral region, stage 4: Secondary | ICD-10-CM | POA: Diagnosis not present

## 2018-02-18 DIAGNOSIS — L89892 Pressure ulcer of other site, stage 2: Secondary | ICD-10-CM | POA: Diagnosis not present

## 2018-02-18 DIAGNOSIS — R1312 Dysphagia, oropharyngeal phase: Secondary | ICD-10-CM | POA: Diagnosis not present

## 2018-02-18 DIAGNOSIS — L89620 Pressure ulcer of left heel, unstageable: Secondary | ICD-10-CM | POA: Diagnosis not present

## 2018-02-18 DIAGNOSIS — I69391 Dysphagia following cerebral infarction: Secondary | ICD-10-CM | POA: Diagnosis not present

## 2018-02-18 DIAGNOSIS — L89610 Pressure ulcer of right heel, unstageable: Secondary | ICD-10-CM | POA: Diagnosis not present

## 2018-02-20 DIAGNOSIS — L89892 Pressure ulcer of other site, stage 2: Secondary | ICD-10-CM | POA: Diagnosis not present

## 2018-02-20 DIAGNOSIS — Z7401 Bed confinement status: Secondary | ICD-10-CM | POA: Diagnosis not present

## 2018-02-20 DIAGNOSIS — Z6827 Body mass index (BMI) 27.0-27.9, adult: Secondary | ICD-10-CM | POA: Diagnosis not present

## 2018-02-20 DIAGNOSIS — I10 Essential (primary) hypertension: Secondary | ICD-10-CM | POA: Diagnosis not present

## 2018-02-20 DIAGNOSIS — L89610 Pressure ulcer of right heel, unstageable: Secondary | ICD-10-CM | POA: Diagnosis not present

## 2018-02-20 DIAGNOSIS — L89154 Pressure ulcer of sacral region, stage 4: Secondary | ICD-10-CM | POA: Diagnosis not present

## 2018-02-20 DIAGNOSIS — E559 Vitamin D deficiency, unspecified: Secondary | ICD-10-CM | POA: Diagnosis not present

## 2018-02-20 DIAGNOSIS — I69391 Dysphagia following cerebral infarction: Secondary | ICD-10-CM | POA: Diagnosis not present

## 2018-02-20 DIAGNOSIS — R0902 Hypoxemia: Secondary | ICD-10-CM | POA: Diagnosis not present

## 2018-02-20 DIAGNOSIS — R404 Transient alteration of awareness: Secondary | ICD-10-CM | POA: Diagnosis not present

## 2018-02-20 DIAGNOSIS — G459 Transient cerebral ischemic attack, unspecified: Secondary | ICD-10-CM | POA: Diagnosis not present

## 2018-02-20 DIAGNOSIS — L89309 Pressure ulcer of unspecified buttock, unspecified stage: Secondary | ICD-10-CM | POA: Diagnosis not present

## 2018-02-20 DIAGNOSIS — Z299 Encounter for prophylactic measures, unspecified: Secondary | ICD-10-CM | POA: Diagnosis not present

## 2018-02-20 DIAGNOSIS — L89620 Pressure ulcer of left heel, unstageable: Secondary | ICD-10-CM | POA: Diagnosis not present

## 2018-02-20 DIAGNOSIS — R1312 Dysphagia, oropharyngeal phase: Secondary | ICD-10-CM | POA: Diagnosis not present

## 2018-02-21 DIAGNOSIS — L89154 Pressure ulcer of sacral region, stage 4: Secondary | ICD-10-CM | POA: Diagnosis not present

## 2018-02-21 DIAGNOSIS — L89610 Pressure ulcer of right heel, unstageable: Secondary | ICD-10-CM | POA: Diagnosis not present

## 2018-02-21 DIAGNOSIS — R1312 Dysphagia, oropharyngeal phase: Secondary | ICD-10-CM | POA: Diagnosis not present

## 2018-02-21 DIAGNOSIS — L89892 Pressure ulcer of other site, stage 2: Secondary | ICD-10-CM | POA: Diagnosis not present

## 2018-02-21 DIAGNOSIS — L89620 Pressure ulcer of left heel, unstageable: Secondary | ICD-10-CM | POA: Diagnosis not present

## 2018-02-21 DIAGNOSIS — I69391 Dysphagia following cerebral infarction: Secondary | ICD-10-CM | POA: Diagnosis not present

## 2018-02-22 DIAGNOSIS — L89610 Pressure ulcer of right heel, unstageable: Secondary | ICD-10-CM | POA: Diagnosis not present

## 2018-02-22 DIAGNOSIS — R1312 Dysphagia, oropharyngeal phase: Secondary | ICD-10-CM | POA: Diagnosis not present

## 2018-02-22 DIAGNOSIS — I69391 Dysphagia following cerebral infarction: Secondary | ICD-10-CM | POA: Diagnosis not present

## 2018-02-22 DIAGNOSIS — L89892 Pressure ulcer of other site, stage 2: Secondary | ICD-10-CM | POA: Diagnosis not present

## 2018-02-22 DIAGNOSIS — L89154 Pressure ulcer of sacral region, stage 4: Secondary | ICD-10-CM | POA: Diagnosis not present

## 2018-02-22 DIAGNOSIS — L89620 Pressure ulcer of left heel, unstageable: Secondary | ICD-10-CM | POA: Diagnosis not present

## 2018-02-23 DIAGNOSIS — L89620 Pressure ulcer of left heel, unstageable: Secondary | ICD-10-CM | POA: Diagnosis not present

## 2018-02-23 DIAGNOSIS — L89610 Pressure ulcer of right heel, unstageable: Secondary | ICD-10-CM | POA: Diagnosis not present

## 2018-02-23 DIAGNOSIS — L89892 Pressure ulcer of other site, stage 2: Secondary | ICD-10-CM | POA: Diagnosis not present

## 2018-02-23 DIAGNOSIS — L89154 Pressure ulcer of sacral region, stage 4: Secondary | ICD-10-CM | POA: Diagnosis not present

## 2018-02-23 DIAGNOSIS — I69391 Dysphagia following cerebral infarction: Secondary | ICD-10-CM | POA: Diagnosis not present

## 2018-02-23 DIAGNOSIS — R1312 Dysphagia, oropharyngeal phase: Secondary | ICD-10-CM | POA: Diagnosis not present

## 2018-02-25 DIAGNOSIS — L89620 Pressure ulcer of left heel, unstageable: Secondary | ICD-10-CM | POA: Diagnosis not present

## 2018-02-25 DIAGNOSIS — I69391 Dysphagia following cerebral infarction: Secondary | ICD-10-CM | POA: Diagnosis not present

## 2018-02-25 DIAGNOSIS — L89610 Pressure ulcer of right heel, unstageable: Secondary | ICD-10-CM | POA: Diagnosis not present

## 2018-02-25 DIAGNOSIS — L89892 Pressure ulcer of other site, stage 2: Secondary | ICD-10-CM | POA: Diagnosis not present

## 2018-02-25 DIAGNOSIS — R1312 Dysphagia, oropharyngeal phase: Secondary | ICD-10-CM | POA: Diagnosis not present

## 2018-02-25 DIAGNOSIS — L89154 Pressure ulcer of sacral region, stage 4: Secondary | ICD-10-CM | POA: Diagnosis not present

## 2018-02-26 DIAGNOSIS — I69391 Dysphagia following cerebral infarction: Secondary | ICD-10-CM | POA: Diagnosis not present

## 2018-02-26 DIAGNOSIS — L89610 Pressure ulcer of right heel, unstageable: Secondary | ICD-10-CM | POA: Diagnosis not present

## 2018-02-26 DIAGNOSIS — L89892 Pressure ulcer of other site, stage 2: Secondary | ICD-10-CM | POA: Diagnosis not present

## 2018-02-26 DIAGNOSIS — R1312 Dysphagia, oropharyngeal phase: Secondary | ICD-10-CM | POA: Diagnosis not present

## 2018-02-26 DIAGNOSIS — L89154 Pressure ulcer of sacral region, stage 4: Secondary | ICD-10-CM | POA: Diagnosis not present

## 2018-02-26 DIAGNOSIS — L89620 Pressure ulcer of left heel, unstageable: Secondary | ICD-10-CM | POA: Diagnosis not present

## 2018-02-27 DIAGNOSIS — L89610 Pressure ulcer of right heel, unstageable: Secondary | ICD-10-CM | POA: Diagnosis not present

## 2018-02-27 DIAGNOSIS — L89620 Pressure ulcer of left heel, unstageable: Secondary | ICD-10-CM | POA: Diagnosis not present

## 2018-02-27 DIAGNOSIS — R1312 Dysphagia, oropharyngeal phase: Secondary | ICD-10-CM | POA: Diagnosis not present

## 2018-02-27 DIAGNOSIS — L89892 Pressure ulcer of other site, stage 2: Secondary | ICD-10-CM | POA: Diagnosis not present

## 2018-02-27 DIAGNOSIS — I69391 Dysphagia following cerebral infarction: Secondary | ICD-10-CM | POA: Diagnosis not present

## 2018-02-27 DIAGNOSIS — L89154 Pressure ulcer of sacral region, stage 4: Secondary | ICD-10-CM | POA: Diagnosis not present

## 2018-02-28 DIAGNOSIS — L89620 Pressure ulcer of left heel, unstageable: Secondary | ICD-10-CM | POA: Diagnosis not present

## 2018-02-28 DIAGNOSIS — L89610 Pressure ulcer of right heel, unstageable: Secondary | ICD-10-CM | POA: Diagnosis not present

## 2018-02-28 DIAGNOSIS — I69391 Dysphagia following cerebral infarction: Secondary | ICD-10-CM | POA: Diagnosis not present

## 2018-02-28 DIAGNOSIS — L89154 Pressure ulcer of sacral region, stage 4: Secondary | ICD-10-CM | POA: Diagnosis not present

## 2018-02-28 DIAGNOSIS — L89892 Pressure ulcer of other site, stage 2: Secondary | ICD-10-CM | POA: Diagnosis not present

## 2018-02-28 DIAGNOSIS — R1312 Dysphagia, oropharyngeal phase: Secondary | ICD-10-CM | POA: Diagnosis not present

## 2018-03-01 DIAGNOSIS — N400 Enlarged prostate without lower urinary tract symptoms: Secondary | ICD-10-CM | POA: Diagnosis not present

## 2018-03-01 DIAGNOSIS — R404 Transient alteration of awareness: Secondary | ICD-10-CM | POA: Diagnosis not present

## 2018-03-01 DIAGNOSIS — Z8701 Personal history of pneumonia (recurrent): Secondary | ICD-10-CM | POA: Diagnosis not present

## 2018-03-01 DIAGNOSIS — L89613 Pressure ulcer of right heel, stage 3: Secondary | ICD-10-CM | POA: Diagnosis not present

## 2018-03-01 DIAGNOSIS — Z931 Gastrostomy status: Secondary | ICD-10-CM | POA: Diagnosis not present

## 2018-03-01 DIAGNOSIS — L89892 Pressure ulcer of other site, stage 2: Secondary | ICD-10-CM | POA: Diagnosis not present

## 2018-03-01 DIAGNOSIS — M109 Gout, unspecified: Secondary | ICD-10-CM | POA: Diagnosis not present

## 2018-03-01 DIAGNOSIS — L89620 Pressure ulcer of left heel, unstageable: Secondary | ICD-10-CM | POA: Diagnosis not present

## 2018-03-01 DIAGNOSIS — G20 Parkinson's disease: Secondary | ICD-10-CM | POA: Diagnosis not present

## 2018-03-01 DIAGNOSIS — R0902 Hypoxemia: Secondary | ICD-10-CM | POA: Diagnosis not present

## 2018-03-01 DIAGNOSIS — E785 Hyperlipidemia, unspecified: Secondary | ICD-10-CM | POA: Diagnosis not present

## 2018-03-01 DIAGNOSIS — Z86718 Personal history of other venous thrombosis and embolism: Secondary | ICD-10-CM | POA: Diagnosis not present

## 2018-03-01 DIAGNOSIS — I10 Essential (primary) hypertension: Secondary | ICD-10-CM | POA: Diagnosis not present

## 2018-03-01 DIAGNOSIS — I2510 Atherosclerotic heart disease of native coronary artery without angina pectoris: Secondary | ICD-10-CM | POA: Diagnosis not present

## 2018-03-01 DIAGNOSIS — L89899 Pressure ulcer of other site, unspecified stage: Secondary | ICD-10-CM | POA: Diagnosis not present

## 2018-03-01 DIAGNOSIS — F329 Major depressive disorder, single episode, unspecified: Secondary | ICD-10-CM | POA: Diagnosis not present

## 2018-03-01 DIAGNOSIS — M1990 Unspecified osteoarthritis, unspecified site: Secondary | ICD-10-CM | POA: Diagnosis not present

## 2018-03-01 DIAGNOSIS — L89159 Pressure ulcer of sacral region, unspecified stage: Secondary | ICD-10-CM | POA: Diagnosis not present

## 2018-03-01 DIAGNOSIS — Z930 Tracheostomy status: Secondary | ICD-10-CM | POA: Diagnosis not present

## 2018-03-01 DIAGNOSIS — L89610 Pressure ulcer of right heel, unstageable: Secondary | ICD-10-CM | POA: Diagnosis not present

## 2018-03-01 DIAGNOSIS — L89154 Pressure ulcer of sacral region, stage 4: Secondary | ICD-10-CM | POA: Diagnosis not present

## 2018-03-01 DIAGNOSIS — R1312 Dysphagia, oropharyngeal phase: Secondary | ICD-10-CM | POA: Diagnosis not present

## 2018-03-01 DIAGNOSIS — Z8673 Personal history of transient ischemic attack (TIA), and cerebral infarction without residual deficits: Secondary | ICD-10-CM | POA: Diagnosis not present

## 2018-03-01 DIAGNOSIS — I69391 Dysphagia following cerebral infarction: Secondary | ICD-10-CM | POA: Diagnosis not present

## 2018-03-01 DIAGNOSIS — Z7401 Bed confinement status: Secondary | ICD-10-CM | POA: Diagnosis not present

## 2018-03-02 DIAGNOSIS — L89610 Pressure ulcer of right heel, unstageable: Secondary | ICD-10-CM | POA: Diagnosis not present

## 2018-03-02 DIAGNOSIS — L89154 Pressure ulcer of sacral region, stage 4: Secondary | ICD-10-CM | POA: Diagnosis not present

## 2018-03-02 DIAGNOSIS — L89892 Pressure ulcer of other site, stage 2: Secondary | ICD-10-CM | POA: Diagnosis not present

## 2018-03-02 DIAGNOSIS — L89620 Pressure ulcer of left heel, unstageable: Secondary | ICD-10-CM | POA: Diagnosis not present

## 2018-03-02 DIAGNOSIS — R1312 Dysphagia, oropharyngeal phase: Secondary | ICD-10-CM | POA: Diagnosis not present

## 2018-03-02 DIAGNOSIS — I69391 Dysphagia following cerebral infarction: Secondary | ICD-10-CM | POA: Diagnosis not present

## 2018-03-04 DIAGNOSIS — R1312 Dysphagia, oropharyngeal phase: Secondary | ICD-10-CM | POA: Diagnosis not present

## 2018-03-04 DIAGNOSIS — L89154 Pressure ulcer of sacral region, stage 4: Secondary | ICD-10-CM | POA: Diagnosis not present

## 2018-03-04 DIAGNOSIS — L89610 Pressure ulcer of right heel, unstageable: Secondary | ICD-10-CM | POA: Diagnosis not present

## 2018-03-04 DIAGNOSIS — L89620 Pressure ulcer of left heel, unstageable: Secondary | ICD-10-CM | POA: Diagnosis not present

## 2018-03-04 DIAGNOSIS — I69391 Dysphagia following cerebral infarction: Secondary | ICD-10-CM | POA: Diagnosis not present

## 2018-03-04 DIAGNOSIS — L89892 Pressure ulcer of other site, stage 2: Secondary | ICD-10-CM | POA: Diagnosis not present

## 2018-03-05 DIAGNOSIS — L89620 Pressure ulcer of left heel, unstageable: Secondary | ICD-10-CM | POA: Diagnosis not present

## 2018-03-05 DIAGNOSIS — I69391 Dysphagia following cerebral infarction: Secondary | ICD-10-CM | POA: Diagnosis not present

## 2018-03-05 DIAGNOSIS — L89892 Pressure ulcer of other site, stage 2: Secondary | ICD-10-CM | POA: Diagnosis not present

## 2018-03-05 DIAGNOSIS — R1312 Dysphagia, oropharyngeal phase: Secondary | ICD-10-CM | POA: Diagnosis not present

## 2018-03-05 DIAGNOSIS — L89610 Pressure ulcer of right heel, unstageable: Secondary | ICD-10-CM | POA: Diagnosis not present

## 2018-03-05 DIAGNOSIS — L89154 Pressure ulcer of sacral region, stage 4: Secondary | ICD-10-CM | POA: Diagnosis not present

## 2018-03-06 DIAGNOSIS — R1312 Dysphagia, oropharyngeal phase: Secondary | ICD-10-CM | POA: Diagnosis not present

## 2018-03-06 DIAGNOSIS — L89154 Pressure ulcer of sacral region, stage 4: Secondary | ICD-10-CM | POA: Diagnosis not present

## 2018-03-06 DIAGNOSIS — L89892 Pressure ulcer of other site, stage 2: Secondary | ICD-10-CM | POA: Diagnosis not present

## 2018-03-06 DIAGNOSIS — L89620 Pressure ulcer of left heel, unstageable: Secondary | ICD-10-CM | POA: Diagnosis not present

## 2018-03-06 DIAGNOSIS — I69391 Dysphagia following cerebral infarction: Secondary | ICD-10-CM | POA: Diagnosis not present

## 2018-03-06 DIAGNOSIS — L89610 Pressure ulcer of right heel, unstageable: Secondary | ICD-10-CM | POA: Diagnosis not present

## 2018-03-07 DIAGNOSIS — I69391 Dysphagia following cerebral infarction: Secondary | ICD-10-CM | POA: Diagnosis not present

## 2018-03-07 DIAGNOSIS — L89610 Pressure ulcer of right heel, unstageable: Secondary | ICD-10-CM | POA: Diagnosis not present

## 2018-03-07 DIAGNOSIS — R1312 Dysphagia, oropharyngeal phase: Secondary | ICD-10-CM | POA: Diagnosis not present

## 2018-03-07 DIAGNOSIS — L89154 Pressure ulcer of sacral region, stage 4: Secondary | ICD-10-CM | POA: Diagnosis not present

## 2018-03-07 DIAGNOSIS — L89892 Pressure ulcer of other site, stage 2: Secondary | ICD-10-CM | POA: Diagnosis not present

## 2018-03-07 DIAGNOSIS — L89620 Pressure ulcer of left heel, unstageable: Secondary | ICD-10-CM | POA: Diagnosis not present

## 2018-03-08 DIAGNOSIS — L89620 Pressure ulcer of left heel, unstageable: Secondary | ICD-10-CM | POA: Diagnosis not present

## 2018-03-08 DIAGNOSIS — L89154 Pressure ulcer of sacral region, stage 4: Secondary | ICD-10-CM | POA: Diagnosis not present

## 2018-03-08 DIAGNOSIS — L89610 Pressure ulcer of right heel, unstageable: Secondary | ICD-10-CM | POA: Diagnosis not present

## 2018-03-08 DIAGNOSIS — L89892 Pressure ulcer of other site, stage 2: Secondary | ICD-10-CM | POA: Diagnosis not present

## 2018-03-08 DIAGNOSIS — I69391 Dysphagia following cerebral infarction: Secondary | ICD-10-CM | POA: Diagnosis not present

## 2018-03-08 DIAGNOSIS — R1312 Dysphagia, oropharyngeal phase: Secondary | ICD-10-CM | POA: Diagnosis not present

## 2018-03-11 DIAGNOSIS — L89154 Pressure ulcer of sacral region, stage 4: Secondary | ICD-10-CM | POA: Diagnosis not present

## 2018-03-11 DIAGNOSIS — R1312 Dysphagia, oropharyngeal phase: Secondary | ICD-10-CM | POA: Diagnosis not present

## 2018-03-11 DIAGNOSIS — L89620 Pressure ulcer of left heel, unstageable: Secondary | ICD-10-CM | POA: Diagnosis not present

## 2018-03-11 DIAGNOSIS — L89892 Pressure ulcer of other site, stage 2: Secondary | ICD-10-CM | POA: Diagnosis not present

## 2018-03-11 DIAGNOSIS — L89610 Pressure ulcer of right heel, unstageable: Secondary | ICD-10-CM | POA: Diagnosis not present

## 2018-03-11 DIAGNOSIS — I69391 Dysphagia following cerebral infarction: Secondary | ICD-10-CM | POA: Diagnosis not present

## 2018-03-12 DIAGNOSIS — L89610 Pressure ulcer of right heel, unstageable: Secondary | ICD-10-CM | POA: Diagnosis not present

## 2018-03-12 DIAGNOSIS — L89154 Pressure ulcer of sacral region, stage 4: Secondary | ICD-10-CM | POA: Diagnosis not present

## 2018-03-12 DIAGNOSIS — L89892 Pressure ulcer of other site, stage 2: Secondary | ICD-10-CM | POA: Diagnosis not present

## 2018-03-12 DIAGNOSIS — L89620 Pressure ulcer of left heel, unstageable: Secondary | ICD-10-CM | POA: Diagnosis not present

## 2018-03-12 DIAGNOSIS — R1312 Dysphagia, oropharyngeal phase: Secondary | ICD-10-CM | POA: Diagnosis not present

## 2018-03-12 DIAGNOSIS — I69391 Dysphagia following cerebral infarction: Secondary | ICD-10-CM | POA: Diagnosis not present

## 2018-03-13 DIAGNOSIS — L89154 Pressure ulcer of sacral region, stage 4: Secondary | ICD-10-CM | POA: Diagnosis not present

## 2018-03-13 DIAGNOSIS — R1312 Dysphagia, oropharyngeal phase: Secondary | ICD-10-CM | POA: Diagnosis not present

## 2018-03-13 DIAGNOSIS — L89610 Pressure ulcer of right heel, unstageable: Secondary | ICD-10-CM | POA: Diagnosis not present

## 2018-03-13 DIAGNOSIS — L89892 Pressure ulcer of other site, stage 2: Secondary | ICD-10-CM | POA: Diagnosis not present

## 2018-03-13 DIAGNOSIS — I69391 Dysphagia following cerebral infarction: Secondary | ICD-10-CM | POA: Diagnosis not present

## 2018-03-13 DIAGNOSIS — L89620 Pressure ulcer of left heel, unstageable: Secondary | ICD-10-CM | POA: Diagnosis not present

## 2018-03-14 DIAGNOSIS — L89892 Pressure ulcer of other site, stage 2: Secondary | ICD-10-CM | POA: Diagnosis not present

## 2018-03-14 DIAGNOSIS — I69391 Dysphagia following cerebral infarction: Secondary | ICD-10-CM | POA: Diagnosis not present

## 2018-03-14 DIAGNOSIS — L89154 Pressure ulcer of sacral region, stage 4: Secondary | ICD-10-CM | POA: Diagnosis not present

## 2018-03-14 DIAGNOSIS — L89610 Pressure ulcer of right heel, unstageable: Secondary | ICD-10-CM | POA: Diagnosis not present

## 2018-03-14 DIAGNOSIS — L89620 Pressure ulcer of left heel, unstageable: Secondary | ICD-10-CM | POA: Diagnosis not present

## 2018-03-14 DIAGNOSIS — R1312 Dysphagia, oropharyngeal phase: Secondary | ICD-10-CM | POA: Diagnosis not present

## 2018-03-15 DIAGNOSIS — L89892 Pressure ulcer of other site, stage 2: Secondary | ICD-10-CM | POA: Diagnosis not present

## 2018-03-15 DIAGNOSIS — R1312 Dysphagia, oropharyngeal phase: Secondary | ICD-10-CM | POA: Diagnosis not present

## 2018-03-15 DIAGNOSIS — I69391 Dysphagia following cerebral infarction: Secondary | ICD-10-CM | POA: Diagnosis not present

## 2018-03-15 DIAGNOSIS — L89620 Pressure ulcer of left heel, unstageable: Secondary | ICD-10-CM | POA: Diagnosis not present

## 2018-03-15 DIAGNOSIS — L89154 Pressure ulcer of sacral region, stage 4: Secondary | ICD-10-CM | POA: Diagnosis not present

## 2018-03-15 DIAGNOSIS — L89610 Pressure ulcer of right heel, unstageable: Secondary | ICD-10-CM | POA: Diagnosis not present

## 2018-03-16 ENCOUNTER — Encounter (HOSPITAL_COMMUNITY): Payer: Self-pay | Admitting: Emergency Medicine

## 2018-03-16 ENCOUNTER — Emergency Department (HOSPITAL_COMMUNITY)
Admission: EM | Admit: 2018-03-16 | Discharge: 2018-03-16 | Disposition: A | Discharge status: Home / Self Care | Payer: Medicare Other | Attending: Emergency Medicine | Admitting: Emergency Medicine

## 2018-03-16 ENCOUNTER — Other Ambulatory Visit: Payer: Self-pay

## 2018-03-16 ENCOUNTER — Emergency Department (HOSPITAL_COMMUNITY): Payer: Medicare Other

## 2018-03-16 DIAGNOSIS — R404 Transient alteration of awareness: Secondary | ICD-10-CM | POA: Diagnosis not present

## 2018-03-16 DIAGNOSIS — Z7401 Bed confinement status: Secondary | ICD-10-CM | POA: Diagnosis not present

## 2018-03-16 DIAGNOSIS — G20 Parkinson's disease: Secondary | ICD-10-CM | POA: Diagnosis not present

## 2018-03-16 DIAGNOSIS — I2510 Atherosclerotic heart disease of native coronary artery without angina pectoris: Secondary | ICD-10-CM | POA: Diagnosis not present

## 2018-03-16 DIAGNOSIS — R0602 Shortness of breath: Secondary | ICD-10-CM | POA: Diagnosis not present

## 2018-03-16 DIAGNOSIS — R279 Unspecified lack of coordination: Secondary | ICD-10-CM | POA: Diagnosis not present

## 2018-03-16 DIAGNOSIS — F17220 Nicotine dependence, chewing tobacco, uncomplicated: Secondary | ICD-10-CM | POA: Insufficient documentation

## 2018-03-16 DIAGNOSIS — Z430 Encounter for attention to tracheostomy: Secondary | ICD-10-CM | POA: Insufficient documentation

## 2018-03-16 DIAGNOSIS — R0902 Hypoxemia: Secondary | ICD-10-CM | POA: Diagnosis not present

## 2018-03-16 DIAGNOSIS — Z8673 Personal history of transient ischemic attack (TIA), and cerebral infarction without residual deficits: Secondary | ICD-10-CM | POA: Insufficient documentation

## 2018-03-16 NOTE — ED Triage Notes (Signed)
Pt was sent from home for a possible trach issue.  Wife states pt's trach was "not right" along with a leak in his wound vac on left hip.  Pt nonverbal.

## 2018-03-16 NOTE — ED Notes (Signed)
Wound vac was off when arrived to ed alarming "leak".  Followed on screen instructions to turn back on and suction maintained with no leak noted.

## 2018-03-16 NOTE — ED Provider Notes (Signed)
Emergency Department Provider Note   I have reviewed the triage vital signs and the nursing notes.   HISTORY  Chief Complaint Tracheostomy Tube Change   HPI Richard Mcpherson is a 77 y.o. male who is nonverbal secondary to a stroke this year because the wife thinks that his trach tube is malpositioned.  No respiratory distress but has had some increased secretions recently.  Has also has some dark and malodorous urine so started on clindamycin and cefuroxime recently. No other associated or modifying symptoms.   Level V CAVEAT Secondary to Nonverbal Past Medical History:  Diagnosis Date  . Atrial premature beats   . Blood in stool   . BPH (benign prostatic hyperplasia)   . Cerebral aneurysm   . Coronary atherosclerosis of native coronary artery   . Depression   . Embolism (HCC)    History of right lower extremity distal embolism   . Gout   . Hyperglycemia   . Hyperlipidemia   . Hyperlipidemia, mixed   . Osteoarthritis   . Parkinson disease (HCC)   . Plantar fasciitis   . Pneumonia   . Pressure ulcer of sacral region   . Protein calorie malnutrition (HCC)   . Sepsis (HCC)   . Sinusitis   . Stroke (HCC)   . Subarachnoid hemorrhage (HCC)   . Tracheostomy dependent (HCC)   . Unspecified essential hypertension     Patient Active Problem List   Diagnosis Date Noted  . Palliative care by specialist   . Counseling regarding advanced care planning and goals of care   . HCAP (healthcare-associated pneumonia) 09/06/2017  . Acute respiratory failure with hypoxemia (HCC)   . Palliative care encounter   . Severe protein-calorie malnutrition (HCC) 08/21/2017  . Decubitus ulcer of sacral region, stage 3 (HCC) 08/20/2017  . Staphylococcal sepsis (HCC) 08/20/2017  . Healthcare-associated pneumonia   . Acute on chronic respiratory failure with hypoxia (HCC) 08/19/2017  . Sepsis due to undetermined organism (HCC) 08/19/2017  . Aspiration pneumonia of both lower lobes due to  gastric secretions (HCC) 08/19/2017  . Tracheostomy status (HCC) 08/19/2017  . Deep vein thrombosis (DVT) of right upper extremity (HCC) 08/19/2017  . Lobar pneumonia (HCC) 08/19/2017  . Transaminasemia   . Sepsis (HCC) 08/18/2017  . Pneumonia 08/18/2017  . Acute hypoxemic respiratory failure (HCC) 08/18/2017  . 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 Stroke (cerebrum) (HCC) 04/18/2017  . SAH (subarachnoid hemorrhage) (HCC) 04/18/2017  . 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 Hyperlipidemia 03/25/2009  . Essential hypertension 03/25/2009  . CAD, NATIVE VESSEL 03/25/2009  . PALPITATIONS 03/25/2009    Past Surgical History:  Procedure Laterality Date  . APPENDECTOMY    . cardiac catherization    . CATARACT EXTRACTION W/PHACO Right 02/19/2014   Procedure: CATARACT EXTRACTION PHACO AND INTRAOCULAR LENS PLACEMENT (IOC);  Surgeon: Gemma Payor, MD;  Location: [facility];  Service: Ophthalmology;  Laterality: Right;  CDE 12.71  . CATARACT EXTRACTION W/PHACO Left 12/28/2014   Procedure: CATARACT EXTRACTION PHACO AND INTRAOCULAR LENS PLACEMENT (IOC);  Surgeon: Gemma Payor, MD;  Location: [facility];  Service: Ophthalmology;  Laterality: Left;  CDE:8.05  . embolectomy 2000    . IR GASTROSTOMY TUBE MOD SED  08/06/2017    Current Outpatient Rx  . Order #: [id number] Class: Historical Med  . Order #: [id number] Class: Historical Med  . Order #: [id number] Class: Historical Med  . Order #: [id number] Class: Historical Med  . Order #: [id number] Class: Historical Med  .  Order #: [id number] Class: Historical Med  . Order #: [id number] Class: No Print  . Order #: [id number] Class: Historical Med  . Order #: [id number] Class: Historical Med  . Order #: [id number] Class: No Print  . Order #: [id number] Class: Historical Med  . Order #: [id number] Class: Historical Med  . Order #: [id number] Class: Historical Med  . Order #: [id number] Class: Historical Med  . Order #:  [id number] Class: Print  . Order #: [id number] Class: Historical Med  . Order #: [id number] Class: Historical Med  . Order #: [id number] Class: Historical Med  . Order #: [id number] Class: No Print    Allergies Codeine; Crestor [rosuvastatin calcium]; Lipitor [atorvastatin]; Statins; and Allopurinol  Family History  Problem Relation Age of Onset  . Cancer Mother     Social History Social History   Tobacco Use  . Smoking status: Former Smoker    Packs/day: 1.00    Years: 40.00    Pack years: 40.00  . Smokeless tobacco: Current User    Types: Snuff  Substance Use Topics  . Alcohol use: Yes    Comment: 1-2 beers every other day   . Drug use: No    Review of Systems  Level V CAVEAT Secondary to Nonverbal ____________________________________________   PHYSICAL EXAM:  VITAL SIGNS: ED Triage Vitals  Enc Vitals Group     BP 03/16/18 1418 131/72     Pulse Rate 03/16/18 1418 94     Resp 03/16/18 1418 16     Temp 03/16/18 1418 98.9 F (37.2 C)     Temp Source 03/16/18 1418 Oral     SpO2 03/16/18 1417 100 %     Weight 03/16/18 1419 168 lb (76.2 kg)    Constitutional: Alert. Well appearing and in no acute distress. Eyes: Conjunctivae are normal. PERRL. EOMI. Head: Atraumatic. Nose: No congestion/rhinnorhea. Mouth/Throat: Mucous membranes are moist.  Oropharynx non-erythematous. Neck: No stridor.  No meningeal signs.  Tracheostomy tube in good position. Has some yellowish crusting around it near tracheostomy hole. No erythema.  Cardiovascular: Normal rate, regular rhythm. Good peripheral circulation. Grossly normal heart sounds.   Respiratory: Normal respiratory effort.  No retractions. Lungs mild wheezing. Gastrointestinal: Soft and nontender. No distention.  Musculoskeletal: No lower extremity tenderness nor edema. No gross deformities of extremities. Neurologic:  No speech and language. No movement of extremities, baseline  Skin:  Skin is warm, dry and intact. No rash  noted.  ____________________________________________  RADIOLOGY  Dg Chest Portable 1 View  Result Date: 03/16/2018 CLINICAL DATA:  Increased shortness of breath. Secretions from trach EXAM: PORTABLE CHEST 1 VIEW COMPARISON:  02/12/2018 FINDINGS: Normal heart size. Aortic tortuosity. Interstitial coarsening that is chronic. Atelectasis in the right middle lobe. No edema, effusion, or pneumothorax. IMPRESSION: Chronic bronchitic markings with mild atelectasis on the right. No definite pneumonia. Electronically Signed   By: Marnee Spring M.D.   On: 03/16/2018 15:27    ____________________________________________    INITIAL IMPRESSION / ASSESSMENT AND PLAN / ED COURSE  Already on appropriate antibiotics for any underlying infection.  Wound VAC seems to be working.  Suction without much difficulty.  Stable for discharge     Pertinent labs & imaging results that were available during my care of the patient were reviewed by me and considered in my medical decision making (see chart for details).  ____________________________________________  FINAL CLINICAL IMPRESSION(S) / ED DIAGNOSES  Final diagnoses:  Tracheostomy care (HCC)     MEDICATIONS GIVEN DURING THIS VISIT:  Medications - No data to display   NEW OUTPATIENT MEDICATIONS STARTED  DURING THIS VISIT:  New Prescriptions   No medications on file    Note:  This note was prepared with assistance of Dragon voice recognition software. Occasional wrong-word or sound-a-like substitutions may have occurred due to the inherent limitations of voice recognition software.   Marily Memos, MD 03/16/18 [phone]

## 2018-03-16 NOTE — ED Notes (Signed)
[facility]ms here to transport pt to residence,

## 2018-03-18 DIAGNOSIS — L89610 Pressure ulcer of right heel, unstageable: Secondary | ICD-10-CM | POA: Diagnosis not present

## 2018-03-18 DIAGNOSIS — L89620 Pressure ulcer of left heel, unstageable: Secondary | ICD-10-CM | POA: Diagnosis not present

## 2018-03-18 DIAGNOSIS — I69391 Dysphagia following cerebral infarction: Secondary | ICD-10-CM | POA: Diagnosis not present

## 2018-03-18 DIAGNOSIS — R1312 Dysphagia, oropharyngeal phase: Secondary | ICD-10-CM | POA: Diagnosis not present

## 2018-03-18 DIAGNOSIS — L89892 Pressure ulcer of other site, stage 2: Secondary | ICD-10-CM | POA: Diagnosis not present

## 2018-03-18 DIAGNOSIS — L89154 Pressure ulcer of sacral region, stage 4: Secondary | ICD-10-CM | POA: Diagnosis not present

## 2018-03-19 DIAGNOSIS — L89620 Pressure ulcer of left heel, unstageable: Secondary | ICD-10-CM | POA: Diagnosis not present

## 2018-03-19 DIAGNOSIS — L89892 Pressure ulcer of other site, stage 2: Secondary | ICD-10-CM | POA: Diagnosis not present

## 2018-03-19 DIAGNOSIS — R1312 Dysphagia, oropharyngeal phase: Secondary | ICD-10-CM | POA: Diagnosis not present

## 2018-03-19 DIAGNOSIS — L89154 Pressure ulcer of sacral region, stage 4: Secondary | ICD-10-CM | POA: Diagnosis not present

## 2018-03-19 DIAGNOSIS — L89610 Pressure ulcer of right heel, unstageable: Secondary | ICD-10-CM | POA: Diagnosis not present

## 2018-03-19 DIAGNOSIS — I69391 Dysphagia following cerebral infarction: Secondary | ICD-10-CM | POA: Diagnosis not present

## 2018-03-20 ENCOUNTER — Encounter (HOSPITAL_COMMUNITY): Payer: Self-pay

## 2018-03-20 ENCOUNTER — Other Ambulatory Visit: Payer: Self-pay

## 2018-03-20 ENCOUNTER — Emergency Department (HOSPITAL_COMMUNITY): Payer: Medicare Other

## 2018-03-20 ENCOUNTER — Emergency Department (HOSPITAL_COMMUNITY)
Admission: EM | Admit: 2018-03-20 | Discharge: 2018-03-20 | Disposition: A | Discharge status: Home / Self Care | Payer: Medicare Other | Attending: Emergency Medicine | Admitting: Emergency Medicine

## 2018-03-20 DIAGNOSIS — R1312 Dysphagia, oropharyngeal phase: Secondary | ICD-10-CM | POA: Diagnosis not present

## 2018-03-20 DIAGNOSIS — I2510 Atherosclerotic heart disease of native coronary artery without angina pectoris: Secondary | ICD-10-CM | POA: Diagnosis not present

## 2018-03-20 DIAGNOSIS — L89154 Pressure ulcer of sacral region, stage 4: Secondary | ICD-10-CM | POA: Diagnosis not present

## 2018-03-20 DIAGNOSIS — Z8673 Personal history of transient ischemic attack (TIA), and cerebral infarction without residual deficits: Secondary | ICD-10-CM | POA: Diagnosis not present

## 2018-03-20 DIAGNOSIS — G20 Parkinson's disease: Secondary | ICD-10-CM | POA: Insufficient documentation

## 2018-03-20 DIAGNOSIS — R197 Diarrhea, unspecified: Secondary | ICD-10-CM | POA: Insufficient documentation

## 2018-03-20 DIAGNOSIS — R402421 Glasgow coma scale score 9-12, in the field [EMT or ambulance]: Secondary | ICD-10-CM | POA: Diagnosis not present

## 2018-03-20 DIAGNOSIS — L89610 Pressure ulcer of right heel, unstageable: Secondary | ICD-10-CM | POA: Diagnosis not present

## 2018-03-20 DIAGNOSIS — R29898 Other symptoms and signs involving the musculoskeletal system: Secondary | ICD-10-CM | POA: Diagnosis not present

## 2018-03-20 DIAGNOSIS — Z79899 Other long term (current) drug therapy: Secondary | ICD-10-CM | POA: Diagnosis not present

## 2018-03-20 DIAGNOSIS — Z86718 Personal history of other venous thrombosis and embolism: Secondary | ICD-10-CM | POA: Diagnosis not present

## 2018-03-20 DIAGNOSIS — I4891 Unspecified atrial fibrillation: Secondary | ICD-10-CM | POA: Diagnosis not present

## 2018-03-20 DIAGNOSIS — R404 Transient alteration of awareness: Secondary | ICD-10-CM | POA: Diagnosis not present

## 2018-03-20 DIAGNOSIS — L89620 Pressure ulcer of left heel, unstageable: Secondary | ICD-10-CM | POA: Diagnosis not present

## 2018-03-20 DIAGNOSIS — L89892 Pressure ulcer of other site, stage 2: Secondary | ICD-10-CM | POA: Diagnosis not present

## 2018-03-20 DIAGNOSIS — Z7401 Bed confinement status: Secondary | ICD-10-CM | POA: Diagnosis not present

## 2018-03-20 DIAGNOSIS — Z87891 Personal history of nicotine dependence: Secondary | ICD-10-CM | POA: Insufficient documentation

## 2018-03-20 DIAGNOSIS — E782 Mixed hyperlipidemia: Secondary | ICD-10-CM | POA: Insufficient documentation

## 2018-03-20 DIAGNOSIS — R0902 Hypoxemia: Secondary | ICD-10-CM | POA: Diagnosis not present

## 2018-03-20 DIAGNOSIS — I69391 Dysphagia following cerebral infarction: Secondary | ICD-10-CM | POA: Diagnosis not present

## 2018-03-20 DIAGNOSIS — R0689 Other abnormalities of breathing: Secondary | ICD-10-CM | POA: Diagnosis not present

## 2018-03-20 DIAGNOSIS — I10 Essential (primary) hypertension: Secondary | ICD-10-CM | POA: Insufficient documentation

## 2018-03-20 DIAGNOSIS — E860 Dehydration: Secondary | ICD-10-CM | POA: Diagnosis not present

## 2018-03-20 LAB — CBC WITH DIFFERENTIAL/PLATELET
BASOS PCT: 0 %
Basophils Absolute: 0 10*3/uL (ref 0.0–0.1)
EOS ABS: 0.5 10*3/uL (ref 0.0–0.7)
EOS PCT: 5 %
HCT: 30.2 % — ABNORMAL LOW (ref 39.0–52.0)
HEMOGLOBIN: 9.3 g/dL — AB (ref 13.0–17.0)
LYMPHS ABS: 1.3 10*3/uL (ref 0.7–4.0)
Lymphocytes Relative: 14 %
MCH: 23.7 pg — AB (ref 26.0–34.0)
MCHC: 30.8 g/dL (ref 30.0–36.0)
MCV: 77 fL — ABNORMAL LOW (ref 78.0–100.0)
MONOS PCT: 10 %
Monocytes Absolute: 0.9 10*3/uL (ref 0.1–1.0)
NEUTROS PCT: 71 %
Neutro Abs: 6.8 10*3/uL (ref 1.7–7.7)
PLATELETS: 370 10*3/uL (ref 150–400)
RBC: 3.92 MIL/uL — AB (ref 4.22–5.81)
RDW: 17.2 % — ABNORMAL HIGH (ref 11.5–15.5)
WBC: 9.4 10*3/uL (ref 4.0–10.5)

## 2018-03-20 LAB — COMPREHENSIVE METABOLIC PANEL
ALBUMIN: 2.2 g/dL — AB (ref 3.5–5.0)
ALK PHOS: 358 U/L — AB (ref 38–126)
ALT: 66 U/L — ABNORMAL HIGH (ref 0–44)
ANION GAP: 9 (ref 5–15)
AST: 40 U/L (ref 15–41)
BUN: 17 mg/dL (ref 8–23)
CHLORIDE: 98 mmol/L (ref 98–111)
CO2: 25 mmol/L (ref 22–32)
Calcium: 8.6 mg/dL — ABNORMAL LOW (ref 8.9–10.3)
Creatinine, Ser: 0.33 mg/dL — ABNORMAL LOW (ref 0.61–1.24)
GFR calc non Af Amer: >60 mL/min (ref >60)
GLUCOSE: 105 mg/dL — AB (ref 70–99)
Potassium: 4.1 mmol/L (ref 3.5–5.1)
SODIUM: 132 mmol/L — AB (ref 135–145)
Total Bilirubin: 0.5 mg/dL (ref 0.3–1.2)
Total Protein: 7.7 g/dL (ref 6.5–8.1)

## 2018-03-20 LAB — URINALYSIS, ROUTINE W REFLEX MICROSCOPIC
BILIRUBIN URINE: NEGATIVE
Bacteria, UA: NONE SEEN
Glucose, UA: NEGATIVE mg/dL
Ketones, ur: NEGATIVE mg/dL
Nitrite: NEGATIVE
Protein, ur: 30 mg/dL — AB
RBC / HPF: >50 RBC/hpf — ABNORMAL HIGH (ref 0–5)
SPECIFIC GRAVITY, URINE: 1.012 (ref 1.005–1.030)
pH: 8 (ref 5.0–8.0)

## 2018-03-20 MED ORDER — SODIUM CHLORIDE 0.9 % IV BOLUS
500.0000 mL | Freq: Once | INTRAVENOUS | Status: AC
  Administered 2018-03-20: 500 mL via INTRAVENOUS

## 2018-03-20 NOTE — ED Notes (Signed)
Have called EMS for transport

## 2018-03-20 NOTE — ED Notes (Signed)
MD at bedside. 

## 2018-03-20 NOTE — Discharge Instructions (Addendum)
As discussed, your evaluation today has been generally reassuring.  But, it is important that you monitor your condition carefully, and do not hesitate to return to the ED if you develop new, or concerning changes in your condition.  Otherwise, please follow-up with your physician for appropriate ongoing care.    You should also contact our Pulmonology Clinic for tracheostomy follow-up questions.

## 2018-03-20 NOTE — ED Notes (Signed)
Have paged respiratory to come and look at [facility]

## 2018-03-20 NOTE — ED Notes (Signed)
Pt had a BM that was watery diarrhea. Has an unstageable pressure ulcer to sacrum. Have cleaned around area applied wet gauze. Am consulting MD as well as getting a sacral dressing from materials.

## 2018-03-20 NOTE — ED Notes (Signed)
Pt now in care of EMS

## 2018-03-20 NOTE — ED Provider Notes (Addendum)
[facility] EMERGENCY DEPARTMENT Provider Note   CSN: [id number] Arrival date & time: 03/20/18  1107     History   Chief Complaint Chief Complaint  Patient presents with  . Diarrhea    HPI Richard Mcpherson is a 77 y.o. male.  According to the wife the patient has had diarrhea for couple weeks going about twice a day.  Patient is a bedbound patient with a history of a stroke and he has a trach.  Patient also has a Foley.  The history is provided by a relative. No language interpreter was used.  Diarrhea   This is a new problem. The current episode started more than 1 week ago. The problem occurs 2 to 4 times per day. The problem has not changed since onset.The stool consistency is described as watery. There has been no fever. Pertinent negatives include no abdominal pain, no vomiting, no chills, no headaches and no cough. He has tried nothing for the symptoms. Risk factors: Bedbound. His past medical history does not include irritable bowel syndrome.    Past Medical History:  Diagnosis Date  . Atrial premature beats   . Blood in stool   . BPH (benign prostatic hyperplasia)   . Cerebral aneurysm   . Coronary atherosclerosis of native coronary artery   . Depression   . Embolism (HCC)    History of right lower extremity distal embolism   . Gout   . Hyperglycemia   . Hyperlipidemia   . Hyperlipidemia, mixed   . Osteoarthritis   . Parkinson disease (HCC)   . Plantar fasciitis   . Pneumonia   . Pressure ulcer of sacral region   . Protein calorie malnutrition (HCC)   . Sepsis (HCC)   . Sinusitis   . Stroke (HCC)   . Subarachnoid hemorrhage (HCC)   . Tracheostomy dependent (HCC)   . Unspecified essential hypertension     Patient Active Problem List   Diagnosis Date Noted  . Palliative care by specialist   . Counseling regarding advanced care planning and goals of care   . HCAP (healthcare-associated pneumonia) 09/06/2017  . Acute respiratory failure with hypoxemia (HCC)    . Palliative care encounter   . Severe protein-calorie malnutrition (HCC) 08/21/2017  . Decubitus ulcer of sacral region, stage 3 (HCC) 08/20/2017  . Staphylococcal sepsis (HCC) 08/20/2017  . Healthcare-associated pneumonia   . Acute on chronic respiratory failure with hypoxia (HCC) 08/19/2017  . Sepsis due to undetermined organism (HCC) 08/19/2017  . Aspiration pneumonia of both lower lobes due to gastric secretions (HCC) 08/19/2017  . Tracheostomy status (HCC) 08/19/2017  . Deep vein thrombosis (DVT) of right upper extremity (HCC) 08/19/2017  . Lobar pneumonia (HCC) 08/19/2017  . Transaminasemia   . Sepsis (HCC) 08/18/2017  . Pneumonia 08/18/2017  . Acute hypoxemic respiratory failure (HCC) 08/18/2017  . 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 Stroke (cerebrum) (HCC) 04/18/2017  . SAH (subarachnoid hemorrhage) (HCC) 04/18/2017  . 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 Hyperlipidemia 03/25/2009  . Essential hypertension 03/25/2009  . CAD, NATIVE VESSEL 03/25/2009  . PALPITATIONS 03/25/2009    Past Surgical History:  Procedure Laterality Date  . APPENDECTOMY    . cardiac catherization    . CATARACT EXTRACTION W/PHACO Right 02/19/2014   Procedure: CATARACT EXTRACTION PHACO AND INTRAOCULAR LENS PLACEMENT (IOC);  Surgeon: Gemma Payor, MD;  Location: [facility];  Service: Ophthalmology;  Laterality: Right;  CDE 12.71  . CATARACT EXTRACTION W/PHACO  Left 12/28/2014   Procedure: CATARACT EXTRACTION PHACO AND INTRAOCULAR LENS PLACEMENT (IOC);  Surgeon: Gemma Payor, MD;  Location: [facility];  Service: Ophthalmology;  Laterality: Left;  CDE:8.05  . embolectomy 2000    . IR GASTROSTOMY TUBE MOD SED  08/06/2017        Home Medications    Prior to Admission medications   Medication Sig Start Date End Date Taking? Authorizing Provider  acetaminophen (TYLENOL) 500 MG tablet Place 500 mg into feeding tube every 6 (six) hours as needed  for mild pain or moderate pain.   Yes [provider]  Amino Acids-Protein Hydrolys (FEEDING SUPPLEMENT, PRO-STAT SUGAR FREE 64,) LIQD Take 30 mLs by mouth 2 (two) times daily with a meal. For wound healing.   Yes [provider]  antiseptic oral rinse (BIOTENE) LIQD 5 mLs by Mouth Rinse route as needed for dry mouth.   Yes [provider]  Ascorbic Acid (VITAMIN C) 100 MG tablet Take 100 mg by mouth daily.   Yes [provider]  baclofen (LIORESAL) 10 MG tablet 5 mg 2 (two) times daily as needed for muscle spasms.    Yes [provider]  cefUROXime (CEFTIN) 500 MG tablet Take 500 mg by mouth 2 (two) times daily with a meal.   Yes [provider]  chlorhexidine (PERIDEX) 0.12 % solution Use as directed 15 mLs in the mouth or throat 2 (two) times daily. For oral care of tongue and the roof of mouth   Yes [provider]  clindamycin (CLEOCIN) 300 MG capsule Take 300 mg by mouth 3 (three) times daily.   Yes [provider]  collagenase (SANTYL) ointment Apply 1 application topically daily. Applied to mid back along the vertebra with the slot is on the wound bed.  Cover with saline moistened gauze, then foam dressing.  Change daily and as needed for soilage.  Apply also twice a day to sacral wound and apply them because an ABD pad over the area. 09/11/17  Yes Vassie Loll, MD  magnesium oxide (MAG-OX) 400 MG tablet Take 400 mg by mouth daily.   Yes [provider]  Multiple Vitamin (MULTIVITAMIN WITH MINERALS) TABS tablet Place 1 tablet into feeding tube daily.   Yes [provider]  vitamin B-12 (CYANOCOBALAMIN) 1000 MCG tablet Take 1,000 mcg by mouth daily.   Yes [provider]  Vitamin D, Ergocalciferol, (DRISDOL) 50000 units CAPS capsule Take 50,000 Units by mouth every Saturday.   Yes [provider]  Zinc 50 MG TABS Take 1 tablet by mouth daily.   Yes [provider]    ipratropium-albuterol (DUONEB) 0.5-2.5 (3) MG/3ML SOLN Take 3 mLs by nebulization every 6 (six) hours. Patient not taking: Reported on 03/20/2018 09/11/17   Vassie Loll, MD  Metoprolol Tartrate 37.5 MG TABS Place 1 tablet into feeding tube daily.    [provider]    Family History Family History  Problem Relation Age of Onset  . Cancer Mother     Social History Social History   Tobacco Use  . Smoking status: Former Smoker    Packs/day: 1.00    Years: 40.00    Pack years: 40.00  . Smokeless tobacco: Current User    Types: Snuff  Substance Use Topics  . Alcohol use: Yes    Comment: 1-2 beers every other day   . Drug use: No     Allergies   Codeine; Crestor [rosuvastatin calcium]; Lipitor [atorvastatin]; Statins; and Allopurinol  Review of Systems Review of Systems  Constitutional: Negative for appetite change, chills and fatigue.  HENT: Negative for congestion, ear discharge and sinus pressure.   Eyes: Negative for discharge.  Respiratory: Negative for cough.   Cardiovascular: Negative for chest pain.  Gastrointestinal: Positive for diarrhea. Negative for abdominal pain and vomiting.  Genitourinary: Negative for frequency and hematuria.  Musculoskeletal: Negative for back pain.  Skin: Negative for rash.  Neurological: Negative for seizures and headaches.  Psychiatric/Behavioral: Negative for hallucinations.     Physical Exam Updated Vital Signs BP 128/65   Pulse 82   Temp 97.7 F (36.5 C) (Oral)   Resp (!) 25   SpO2 96%   Physical Exam  Constitutional:  Cachectic  HENT:  Head: Normocephalic.  Patient has a trach  Eyes: Conjunctivae and EOM are normal. No scleral icterus.  Neck: Neck supple. No thyromegaly present.  Cardiovascular: Normal rate and regular rhythm. Exam reveals no gallop and no friction rub.  No murmur heard. Pulmonary/Chest: No stridor. He has no wheezes. He has no rales. He exhibits no tenderness.  Abdominal: He exhibits  no distension. There is no tenderness. There is no rebound.  Musculoskeletal: He exhibits no edema.  Patient has contractures in all extremities  Lymphadenopathy:    He has no cervical adenopathy.  Neurological: He is alert. He exhibits normal muscle tone. Coordination normal.  Patient cannot speak  Skin: No rash noted. No erythema.     ED Treatments / Results  Labs (all labs ordered are listed, but only abnormal results are displayed) Labs Reviewed  CBC WITH DIFFERENTIAL/PLATELET - Abnormal; Notable for the following components:      Result Value   RBC 3.92 (*)    Hemoglobin 9.3 (*)    HCT 30.2 (*)    MCV 77.0 (*)    MCH 23.7 (*)    RDW 17.2 (*)    All other components within normal limits  COMPREHENSIVE METABOLIC PANEL - Abnormal; Notable for the following components:   Sodium 132 (*)    Glucose, Bld 105 (*)    Creatinine, Ser 0.33 (*)    Calcium 8.6 (*)    Albumin 2.2 (*)    ALT 66 (*)    Alkaline Phosphatase 358 (*)    All other components within normal limits  C DIFFICILE QUICK SCREEN W PCR REFLEX  URINALYSIS, ROUTINE W REFLEX MICROSCOPIC    EKG None  Radiology Dg Abd Acute W/chest  Result Date: 03/20/2018 CLINICAL DATA:  Pain, diarrhea for several days EXAM: DG ABDOMEN ACUTE W/ 1V CHEST COMPARISON:  Chest radiograph 03/16/2018, abdominal radiograph 07/20/2017 FINDINGS: Tracheostomy tube stable. Normal heart size, mediastinal contours and pulmonary vascularity for technique. Low lung volumes with minimal bibasilar atelectasis. No infiltrate, pleural effusion or pneumothorax. Bones demineralized. Gastrostomy tube noted. Nonobstructive bowel gas pattern. No bowel dilatation or bowel wall thickening. No urinary tract calcification. IMPRESSION: Low lung volumes with minimal bibasilar atelectasis. No acute abdominal findings. Electronically Signed   By: Ulyses Southward M.D.   On: 03/20/2018 14:24    Procedures Procedures (including critical care time)  Medications  Ordered in ED Medications  sodium chloride 0.9 % bolus 500 mL (0 mLs Intravenous Stopped 03/20/18 1226)  sodium chloride 0.9 % bolus 500 mL (0 mLs Intravenous Stopped 03/20/18 1434)     Initial Impression / Assessment and Plan / ED Course  I have reviewed the triage vital signs and the nursing notes.  Pertinent labs & imaging results that were available during my  care of the patient were reviewed by me and considered in my medical decision making (see chart for details).    Patient's mother states that her primary care doctor wanted the patient taken to [facility] and be admitted at [facility].  The wife could not tell me why her primary care doctor felt that Mr. Lackey needed to be admitted at [facility].  I called her primary care doctor and he stated he never told her that he felt like her husband should be admitted.  I allowed her to speak to her primary care doctor on the phone for quite some time and then I spoke with her primary care doctor again.  He still stated that the patient did not need to go to [facility] to be admitted.  I talk with the patient's wife again and she wanted [place] to check his trach which seemed to be functioning properly.  And also to check his urine..  I feel like if his trach is doing well and he has a urine that is not infected then he can be discharged home.  His primary care doctor has arranged all sorts of home health and physical therapy at home and agrees with this plan  Final Clinical Impressions(s) / ED Diagnoses   Final diagnoses:  None    ED Discharge Orders    None       Bethann Berkshire, MD 03/20/18 Lisabeth Register    Bethann Berkshire, MD 03/20/18 [phone]

## 2018-03-20 NOTE — ED Triage Notes (Signed)
Pt brought from home from EMS.  Reports diarrhea x 1 week.  CBG 118.  EMS says pt's wife concerned about dehydration.  Wife called pcp and was told to send pt to er.

## 2018-03-20 NOTE — ED Notes (Signed)
Respiratory in room discussing reasons to not change trach in the ED. MD notified

## 2018-03-20 NOTE — Progress Notes (Signed)
Asked to assess trach by MD. Dr. Jeraldine Loots took over for Dr. Estell Harpin and Dr. Jeraldine Loots stated Dr. Estell Harpin did not feel trach needed to be emergently replaced. Wife states she thinks trach needs to be changed and I told her I could not just change it without a doctors order. She is very frustrated and thinks it should be changed and I asked had she discussed with her primary doctor and she said she has not. Dr. Jeraldine Loots stated per report from Dr. Estell Harpin, he does not feel it needs to be changed emergently and I suggested outpatient trach clinic to MD. MD stated when finished with current procedure, he would go in and assess himself. RN is aware.

## 2018-03-22 DIAGNOSIS — L89610 Pressure ulcer of right heel, unstageable: Secondary | ICD-10-CM | POA: Diagnosis not present

## 2018-03-22 DIAGNOSIS — L89154 Pressure ulcer of sacral region, stage 4: Secondary | ICD-10-CM | POA: Diagnosis not present

## 2018-03-22 DIAGNOSIS — R1312 Dysphagia, oropharyngeal phase: Secondary | ICD-10-CM | POA: Diagnosis not present

## 2018-03-22 DIAGNOSIS — L89892 Pressure ulcer of other site, stage 2: Secondary | ICD-10-CM | POA: Diagnosis not present

## 2018-03-22 DIAGNOSIS — I69391 Dysphagia following cerebral infarction: Secondary | ICD-10-CM | POA: Diagnosis not present

## 2018-03-22 DIAGNOSIS — L89620 Pressure ulcer of left heel, unstageable: Secondary | ICD-10-CM | POA: Diagnosis not present

## 2018-03-25 DIAGNOSIS — I69391 Dysphagia following cerebral infarction: Secondary | ICD-10-CM | POA: Diagnosis not present

## 2018-03-25 DIAGNOSIS — L89610 Pressure ulcer of right heel, unstageable: Secondary | ICD-10-CM | POA: Diagnosis not present

## 2018-03-25 DIAGNOSIS — L89892 Pressure ulcer of other site, stage 2: Secondary | ICD-10-CM | POA: Diagnosis not present

## 2018-03-25 DIAGNOSIS — L89620 Pressure ulcer of left heel, unstageable: Secondary | ICD-10-CM | POA: Diagnosis not present

## 2018-03-25 DIAGNOSIS — R1312 Dysphagia, oropharyngeal phase: Secondary | ICD-10-CM | POA: Diagnosis not present

## 2018-03-25 DIAGNOSIS — L89154 Pressure ulcer of sacral region, stage 4: Secondary | ICD-10-CM | POA: Diagnosis not present

## 2018-03-26 DIAGNOSIS — R1312 Dysphagia, oropharyngeal phase: Secondary | ICD-10-CM | POA: Diagnosis not present

## 2018-03-26 DIAGNOSIS — L89154 Pressure ulcer of sacral region, stage 4: Secondary | ICD-10-CM | POA: Diagnosis not present

## 2018-03-26 DIAGNOSIS — L89610 Pressure ulcer of right heel, unstageable: Secondary | ICD-10-CM | POA: Diagnosis not present

## 2018-03-26 DIAGNOSIS — L89892 Pressure ulcer of other site, stage 2: Secondary | ICD-10-CM | POA: Diagnosis not present

## 2018-03-26 DIAGNOSIS — I69391 Dysphagia following cerebral infarction: Secondary | ICD-10-CM | POA: Diagnosis not present

## 2018-03-26 DIAGNOSIS — L89620 Pressure ulcer of left heel, unstageable: Secondary | ICD-10-CM | POA: Diagnosis not present

## 2018-03-27 ENCOUNTER — Ambulatory Visit (HOSPITAL_COMMUNITY)
Admission: RE | Admit: 2018-03-27 | Discharge: 2018-03-27 | Disposition: A | Discharge status: Home / Self Care | Payer: Medicare Other | Source: Ambulatory Visit | Attending: Acute Care | Admitting: Acute Care

## 2018-03-27 ENCOUNTER — Other Ambulatory Visit: Payer: Self-pay | Admitting: Acute Care

## 2018-03-27 DIAGNOSIS — R1310 Dysphagia, unspecified: Secondary | ICD-10-CM | POA: Diagnosis not present

## 2018-03-27 DIAGNOSIS — F329 Major depressive disorder, single episode, unspecified: Secondary | ICD-10-CM | POA: Insufficient documentation

## 2018-03-27 DIAGNOSIS — R627 Adult failure to thrive: Secondary | ICD-10-CM | POA: Diagnosis not present

## 2018-03-27 DIAGNOSIS — E43 Unspecified severe protein-calorie malnutrition: Secondary | ICD-10-CM | POA: Insufficient documentation

## 2018-03-27 DIAGNOSIS — M109 Gout, unspecified: Secondary | ICD-10-CM | POA: Diagnosis not present

## 2018-03-27 DIAGNOSIS — I2510 Atherosclerotic heart disease of native coronary artery without angina pectoris: Secondary | ICD-10-CM | POA: Insufficient documentation

## 2018-03-27 DIAGNOSIS — E782 Mixed hyperlipidemia: Secondary | ICD-10-CM | POA: Insufficient documentation

## 2018-03-27 DIAGNOSIS — R0902 Hypoxemia: Secondary | ICD-10-CM | POA: Diagnosis not present

## 2018-03-27 DIAGNOSIS — Z7401 Bed confinement status: Secondary | ICD-10-CM | POA: Diagnosis not present

## 2018-03-27 DIAGNOSIS — Z888 Allergy status to other drugs, medicaments and biological substances status: Secondary | ICD-10-CM | POA: Insufficient documentation

## 2018-03-27 DIAGNOSIS — I69320 Aphasia following cerebral infarction: Secondary | ICD-10-CM | POA: Diagnosis not present

## 2018-03-27 DIAGNOSIS — Z9889 Other specified postprocedural states: Secondary | ICD-10-CM | POA: Diagnosis not present

## 2018-03-27 DIAGNOSIS — I69351 Hemiplegia and hemiparesis following cerebral infarction affecting right dominant side: Secondary | ICD-10-CM | POA: Diagnosis not present

## 2018-03-27 DIAGNOSIS — Z930 Tracheostomy status: Secondary | ICD-10-CM | POA: Diagnosis not present

## 2018-03-27 DIAGNOSIS — I10 Essential (primary) hypertension: Secondary | ICD-10-CM | POA: Insufficient documentation

## 2018-03-27 DIAGNOSIS — R404 Transient alteration of awareness: Secondary | ICD-10-CM | POA: Diagnosis not present

## 2018-03-27 DIAGNOSIS — R1312 Dysphagia, oropharyngeal phase: Secondary | ICD-10-CM | POA: Diagnosis not present

## 2018-03-27 DIAGNOSIS — Z885 Allergy status to narcotic agent status: Secondary | ICD-10-CM | POA: Diagnosis not present

## 2018-03-27 DIAGNOSIS — L89892 Pressure ulcer of other site, stage 2: Secondary | ICD-10-CM | POA: Diagnosis not present

## 2018-03-27 DIAGNOSIS — I69391 Dysphagia following cerebral infarction: Secondary | ICD-10-CM | POA: Diagnosis not present

## 2018-03-27 DIAGNOSIS — J3800 Paralysis of vocal cords and larynx, unspecified: Secondary | ICD-10-CM | POA: Diagnosis not present

## 2018-03-27 DIAGNOSIS — Z430 Encounter for attention to tracheostomy: Secondary | ICD-10-CM | POA: Diagnosis not present

## 2018-03-27 DIAGNOSIS — Z809 Family history of malignant neoplasm, unspecified: Secondary | ICD-10-CM | POA: Diagnosis not present

## 2018-03-27 DIAGNOSIS — L89620 Pressure ulcer of left heel, unstageable: Secondary | ICD-10-CM | POA: Diagnosis not present

## 2018-03-27 DIAGNOSIS — G20 Parkinson's disease: Secondary | ICD-10-CM | POA: Diagnosis not present

## 2018-03-27 DIAGNOSIS — Z87891 Personal history of nicotine dependence: Secondary | ICD-10-CM | POA: Diagnosis not present

## 2018-03-27 DIAGNOSIS — Z8674 Personal history of sudden cardiac arrest: Secondary | ICD-10-CM | POA: Insufficient documentation

## 2018-03-27 DIAGNOSIS — N400 Enlarged prostate without lower urinary tract symptoms: Secondary | ICD-10-CM | POA: Insufficient documentation

## 2018-03-27 DIAGNOSIS — M1990 Unspecified osteoarthritis, unspecified site: Secondary | ICD-10-CM | POA: Insufficient documentation

## 2018-03-27 DIAGNOSIS — Z79899 Other long term (current) drug therapy: Secondary | ICD-10-CM | POA: Insufficient documentation

## 2018-03-27 DIAGNOSIS — L89610 Pressure ulcer of right heel, unstageable: Secondary | ICD-10-CM | POA: Diagnosis not present

## 2018-03-27 DIAGNOSIS — L89154 Pressure ulcer of sacral region, stage 4: Secondary | ICD-10-CM | POA: Diagnosis not present

## 2018-03-27 DIAGNOSIS — I6389 Other cerebral infarction: Secondary | ICD-10-CM

## 2018-03-27 NOTE — Progress Notes (Signed)
Richard Martinet, NP  P [facility] Triage Pool        Please refer patient to [facility] Neurology   Reason for referral: 1) previously seen by Dr Roda Shutters. Wife would like to get re-established with new MD if Xu no longer there  2) progressive neurological decline.   Thanks

## 2018-03-27 NOTE — Consult Note (Signed)
Richard Mcpherson  MDY:[id number] DOB: 07-21-1940 DOA: 03/27/2018 PCP: Kirstie Peri, MD    LOS: 0 days   Reason for Consult / Chief Complaint:  Tracheostomy management  Consulting MD and date:  Self-referral  HPI/Summary of hospital stay:  77 year old male with a very complicated medical history which includes Prior subarachnoid hemorrhage from ruptured aneurysm back in June 2018, then subsequent stroke in December 2018 with right-sided hemiparesis after a fall.  He apparently received IV TPA and subsequently developed large intraventricular hemorrhage as well as subdural hematoma.  Course was complicated by cardiac arrest with brief return to spontaneous circulation, also cervical cord contusion requiring fusion.  He had a prolonged course of ventilator dependence ultimately requiring tracheostomy, course also complicated by multiple healthcare associated pneumonias in DVT.  He was ultimately discharged to [facility] where he was weaned from the ventilator, then briefly to a nursing home, and now to home.  Since January 2019 he has been essentially bedbound, he is tracheostomy dependent, nonverbal, contracted in his lower extremities, and review of hospital records demonstrate at least 6 emergency room visits since discharge.  He was seen by the respiratory care department at the [facility] nursing home, who recommended referral to the tracheostomy clinic which is why he is here today.  Subjective:  Appears comfortable  Objective   There were no vitals taken for this visit.       No intake or output data in the 24 hours ending 03/27/18 1525 There were no vitals filed for this visit.  Examination: General: Chronically ill-appearing white male resting in transport stretcher HENT: Mucous membranes are dry, he has a large tracheostomy stoma which is well matured.  No significant drainage or discharge.  He has a size 6 cuffless tracheostomy in place he is unable to phonate. Lungs:  Rhonchorous breath sounds Cardiovascular: Regular rate and rhythm Abdomen: Soft, no tenderness.  PEG tubes unremarkable Extremities: Poor muscle tone, lower extremities are contracted Neuro: Awake, blinks possibly to stimulation.  Otherwise not able to follow commands GU: Urine  Procedures: Size 6 tracheostomy tube was removed, tracheostomy stoma inspected, new tracheostomy tube size 6 was placed without difficulty.  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  Failure to thrive decubitus ulcer Foley catheter dependent  Discussion This is an unfortunate debilitated bedbound 77 year old male who is tracheostomy dependent after a prolonged illness status post intracerebral and intraventricular hemorrhage which occurred after TPA for acute stroke.  He has had significant deconditioning, approximately 50 pound weight loss, and now is contracted in his lower extremities.  He is tracheostomy dependent due to poor airway management.  He presents today to tracheostomy clinic to get established for tracheostomy care.  His wife is curious about tracheostomy removal.  I have some very big concerns about removing his tracheostomy especially as his course over the last 9 months has been warned of persistent decline.  He has had weight loss, has temporal wasting, has a decubitus ulcer, and has had several emergency room visits this all supports clinical failure to thrive.  I do not think it would be safe to  decannulate him, unless we are pursuing more palliative focus of  care.  His wife is having a hard time coming to terms with this, and is frustrated because the patient's prior neurologist is no longer with [facility] neurology.  She tells me her primary care provider has told that there is nothing more that can be done, and that he will most likely be bedbound, continue to decline, and not improve.  I took the wife outside the room, and shared with her my concern that this is indeed probably the case given his course over the last 9 months.  She tells me in the back of her mind she knows this is likely the case, however she keeps asking for "tests" or at least a neurologist to look at him once more and weigh in with their opinion.  I think that is reasonable given he had a previous relationship with [facility] and neurology and therefore I have asked my office to send a request to get him reestablished at least to be seen once.  From my standpoint I am very unlikely to decannulate the man unless the goal is to transition to palliative  Plan Continue routine tracheostomy care We will see him again in 12 weeks I have placed a referral to [facility] neurology through our office  Review of Systems:   Unable Past medical history  He,  has a past medical history of Atrial premature beats, Blood in stool, BPH (benign prostatic hyperplasia), Cerebral aneurysm, Coronary atherosclerosis of native coronary artery, Depression, Embolism (HCC), Gout, Hyperglycemia, Hyperlipidemia, Hyperlipidemia, mixed, Osteoarthritis, Parkinson disease (HCC), Plantar fasciitis, Pneumonia, Pressure ulcer of sacral region, Protein calorie malnutrition (HCC), Sepsis (HCC), Sinusitis, Stroke (HCC), Subarachnoid hemorrhage (HCC), Tracheostomy dependent (HCC), and Unspecified essential hypertension.   Surgical History    Past Surgical History:  Procedure Laterality Date  . APPENDECTOMY    . cardiac catherization    . CATARACT EXTRACTION W/PHACO Right 02/19/2014   Procedure: CATARACT EXTRACTION PHACO AND  INTRAOCULAR LENS PLACEMENT (IOC);  Surgeon: Gemma Payor, MD;  Location: [facility];  Service: Ophthalmology;  Laterality: Right;  CDE 12.71  . CATARACT EXTRACTION W/PHACO Left 12/28/2014   Procedure: CATARACT EXTRACTION PHACO AND INTRAOCULAR LENS PLACEMENT (IOC);  Surgeon: Gemma Payor, MD;  Location: [facility];  Service: Ophthalmology;  Laterality: Left;  CDE:8.05  . embolectomy 2000    . IR GASTROSTOMY TUBE MOD SED  08/06/2017     Social History   Social History   Socioeconomic History  . Marital status: Married    Spouse name: Not on file  . Number of children: Not on file  . Years of education: Not on file  . Highest education level: Not on file  Occupational History  . Occupation: retired  Engineer, production  . Financial resource strain: Not on file  . Food insecurity:    Worry: Not on file    Inability: Not on file  . Transportation needs:    Medical: Not on file    Non-medical: Not on file  Tobacco Use  . Smoking status: Former Smoker    Packs/day: 1.00    Years: 40.00    Pack years: 40.00  . Smokeless tobacco: Current User    Types: Snuff  Substance and Sexual Activity  . Alcohol use: Yes    Comment: 1-2 beers every other day   . Drug use: No  . Sexual activity: Not on file  Lifestyle  . Physical activity:    Days per week: Not on file  Minutes per session: Not on file  . Stress: Not on file  Relationships  . Social connections:    Talks on phone: Not on file    Gets together: Not on file    Attends religious service: Not on file    Active member of club or organization: Not on file    Attends meetings of clubs or organizations: Not on file    Relationship status: Not on file  . Intimate partner violence:    Fear of current or ex partner: Not on file    Emotionally abused: Not on file    Physically abused: Not on file    Forced sexual activity: Not on file  Other Topics Concern  . Not on file  Social History Narrative  . Not on file  ,  reports that he has quit  smoking. He has a 40.00 pack-year smoking history. His smokeless tobacco use includes snuff. He reports that he drinks alcohol. He reports that he does not use drugs.   Family history   His family history includes Cancer in his mother.   Allergies Allergies  Allergen Reactions  . Codeine Nausea Only and Other (See Comments)  . Crestor [Rosuvastatin Calcium] Other (See Comments)    bodyache  . Lipitor [Atorvastatin] Other (See Comments)    Body aches  . Statins   . Allopurinol Rash    Home meds  Prior to Admission medications   Medication Sig Start Date End Date Taking? Authorizing Provider  acetaminophen (TYLENOL) 500 MG tablet Place 500 mg into feeding tube every 6 (six) hours as needed for mild pain or moderate pain.    [provider]  Amino Acids-Protein Hydrolys (FEEDING SUPPLEMENT, PRO-STAT SUGAR FREE 64,) LIQD Take 30 mLs by mouth 2 (two) times daily with a meal. For wound healing.    [provider]  antiseptic oral rinse (BIOTENE) LIQD 5 mLs by Mouth Rinse route as needed for dry mouth.    [provider]  Ascorbic Acid (VITAMIN C) 100 MG tablet Take 100 mg by mouth daily.    [provider]  baclofen (LIORESAL) 10 MG tablet 5 mg 2 (two) times daily as needed for muscle spasms.     [provider]  cefUROXime (CEFTIN) 500 MG tablet Take 500 mg by mouth 2 (two) times daily with a meal.    [provider]  chlorhexidine (PERIDEX) 0.12 % solution Use as directed 15 mLs in the mouth or throat 2 (two) times daily. For oral care of tongue and the roof of mouth    [provider]  clindamycin (CLEOCIN) 300 MG capsule Take 300 mg by mouth 3 (three) times daily.    [provider]  collagenase (SANTYL) ointment Apply 1 application topically daily. Applied to mid back along the vertebra with the slot is on the wound bed.  Cover with saline moistened gauze, then foam dressing.  Change daily and as needed for  soilage.  Apply also twice a day to sacral wound and apply them because an ABD pad over the area. 09/11/17   Vassie Loll, MD  ipratropium-albuterol (DUONEB) 0.5-2.5 (3) MG/3ML SOLN Take 3 mLs by nebulization every 6 (six) hours. Patient not taking: Reported on 03/20/2018 09/11/17   Vassie Loll, MD  magnesium oxide (MAG-OX) 400 MG tablet Take 400 mg by mouth daily.    [provider]  Metoprolol Tartrate 37.5 MG TABS Place 1 tablet into feeding tube daily.    [provider]  Multiple Vitamin (MULTIVITAMIN WITH MINERALS) TABS tablet Place 1 tablet into feeding tube daily.    [provider]  vitamin B-12 (CYANOCOBALAMIN) 1000 MCG tablet Take 1,000 mcg by mouth daily.    [provider]  Vitamin D, Ergocalciferol, (DRISDOL) 50000 units CAPS capsule Take 50,000 Units by mouth every Saturday.    [provider]  Zinc 50 MG TABS Take 1 tablet by mouth daily.    [provider]      Simonne Martinet ACNP-BC [facility] Pulmonary/Critical Care Pager # [phone] OR # [phone] if no answer

## 2018-03-27 NOTE — Progress Notes (Addendum)
Tracheostomy Procedure Note  Richard Mcpherson [id number] 1940-09-23  Pre Procedure Tracheostomy Information  Trach Brand: Shiley Size: 6.0 Style: Uncuffed Secured by: Velcro Vitals BP 127/61  RR 16  HR 89  Saturations 95% via trach collar at 4LPM Pt suctioned x2 small amount thin white secretions good strong cough.  Procedure:Trach change and trach cleaning    Post Procedure Tracheostomy Information  Trach Brand: Shiley Size: 6.0 Style: Uncuffed Secured by: Velcro   Post Procedure Evaluation:  ETCO2 positive color change from yellow to purple : Yes.   Vital signs:blood pressure 127/61, pulse 89, respirations 16 and pulse oximetry 97%  4lpm  Trach collar Patients current condition: stable Complications: No apparent complications Trach site exam: clean Wound care done: dry Patient did tolerate procedure well.   Education: Production manager, with new trach style, changing of trach ties, cleaning of inner cannula, changing of trach gauze Pt wife does excellent trach care and cleaning at home.  Prescription needs: none    Additional needs: x2 trachs size 6 shiley  cuffless, trach ties x2, trach cleaning kits x3

## 2018-03-28 DIAGNOSIS — I69391 Dysphagia following cerebral infarction: Secondary | ICD-10-CM | POA: Diagnosis not present

## 2018-03-28 DIAGNOSIS — L89892 Pressure ulcer of other site, stage 2: Secondary | ICD-10-CM | POA: Diagnosis not present

## 2018-03-28 DIAGNOSIS — L89620 Pressure ulcer of left heel, unstageable: Secondary | ICD-10-CM | POA: Diagnosis not present

## 2018-03-28 DIAGNOSIS — L89610 Pressure ulcer of right heel, unstageable: Secondary | ICD-10-CM | POA: Diagnosis not present

## 2018-03-28 DIAGNOSIS — R1312 Dysphagia, oropharyngeal phase: Secondary | ICD-10-CM | POA: Diagnosis not present

## 2018-03-28 DIAGNOSIS — L89154 Pressure ulcer of sacral region, stage 4: Secondary | ICD-10-CM | POA: Diagnosis not present

## 2018-03-29 DIAGNOSIS — L89610 Pressure ulcer of right heel, unstageable: Secondary | ICD-10-CM | POA: Diagnosis not present

## 2018-03-29 DIAGNOSIS — L89620 Pressure ulcer of left heel, unstageable: Secondary | ICD-10-CM | POA: Diagnosis not present

## 2018-03-29 DIAGNOSIS — L89892 Pressure ulcer of other site, stage 2: Secondary | ICD-10-CM | POA: Diagnosis not present

## 2018-03-29 DIAGNOSIS — L89154 Pressure ulcer of sacral region, stage 4: Secondary | ICD-10-CM | POA: Diagnosis not present

## 2018-03-29 DIAGNOSIS — R1312 Dysphagia, oropharyngeal phase: Secondary | ICD-10-CM | POA: Diagnosis not present

## 2018-03-29 DIAGNOSIS — I69391 Dysphagia following cerebral infarction: Secondary | ICD-10-CM | POA: Diagnosis not present

## 2018-04-01 DIAGNOSIS — L89154 Pressure ulcer of sacral region, stage 4: Secondary | ICD-10-CM | POA: Diagnosis not present

## 2018-04-01 DIAGNOSIS — R1312 Dysphagia, oropharyngeal phase: Secondary | ICD-10-CM | POA: Diagnosis not present

## 2018-04-01 DIAGNOSIS — L89892 Pressure ulcer of other site, stage 2: Secondary | ICD-10-CM | POA: Diagnosis not present

## 2018-04-01 DIAGNOSIS — L89620 Pressure ulcer of left heel, unstageable: Secondary | ICD-10-CM | POA: Diagnosis not present

## 2018-04-01 DIAGNOSIS — I69391 Dysphagia following cerebral infarction: Secondary | ICD-10-CM | POA: Diagnosis not present

## 2018-04-01 DIAGNOSIS — L89610 Pressure ulcer of right heel, unstageable: Secondary | ICD-10-CM | POA: Diagnosis not present

## 2018-04-03 ENCOUNTER — Other Ambulatory Visit: Payer: Self-pay | Admitting: Acute Care

## 2018-04-03 DIAGNOSIS — L89610 Pressure ulcer of right heel, unstageable: Secondary | ICD-10-CM | POA: Diagnosis not present

## 2018-04-03 DIAGNOSIS — R1312 Dysphagia, oropharyngeal phase: Secondary | ICD-10-CM | POA: Diagnosis not present

## 2018-04-03 DIAGNOSIS — L89620 Pressure ulcer of left heel, unstageable: Secondary | ICD-10-CM | POA: Diagnosis not present

## 2018-04-03 DIAGNOSIS — L89154 Pressure ulcer of sacral region, stage 4: Secondary | ICD-10-CM | POA: Diagnosis not present

## 2018-04-03 DIAGNOSIS — L89892 Pressure ulcer of other site, stage 2: Secondary | ICD-10-CM | POA: Diagnosis not present

## 2018-04-03 DIAGNOSIS — I69391 Dysphagia following cerebral infarction: Secondary | ICD-10-CM | POA: Diagnosis not present

## 2018-04-03 MED ORDER — AMOXICILLIN-POT CLAVULANATE 875-125 MG PO TABS: 1.0000 | ORAL_TABLET | Freq: Two times a day (BID) | ORAL | 0 refills | Status: AC

## 2018-04-03 NOTE — Progress Notes (Signed)
[name] called. Coughing up increased mucous w/ streaks of blood. Just started this am. Suspect early bronchitis  Plan 7d Augmentin If hemoptysis increases she has been instructed to go to ER  Simonne Martinet ACNP-BC [facility] Pulmonary/Critical Care Pager # [phone] OR # [phone] if no answer

## 2018-04-05 DIAGNOSIS — L89610 Pressure ulcer of right heel, unstageable: Secondary | ICD-10-CM | POA: Diagnosis not present

## 2018-04-05 DIAGNOSIS — L89159 Pressure ulcer of sacral region, unspecified stage: Secondary | ICD-10-CM | POA: Diagnosis not present

## 2018-04-05 DIAGNOSIS — R404 Transient alteration of awareness: Secondary | ICD-10-CM | POA: Diagnosis not present

## 2018-04-05 DIAGNOSIS — Z743 Need for continuous supervision: Secondary | ICD-10-CM | POA: Diagnosis not present

## 2018-04-05 DIAGNOSIS — R1312 Dysphagia, oropharyngeal phase: Secondary | ICD-10-CM | POA: Diagnosis not present

## 2018-04-05 DIAGNOSIS — R0902 Hypoxemia: Secondary | ICD-10-CM | POA: Diagnosis not present

## 2018-04-05 DIAGNOSIS — L89154 Pressure ulcer of sacral region, stage 4: Secondary | ICD-10-CM | POA: Diagnosis not present

## 2018-04-05 DIAGNOSIS — I69391 Dysphagia following cerebral infarction: Secondary | ICD-10-CM | POA: Diagnosis not present

## 2018-04-05 DIAGNOSIS — L89894 Pressure ulcer of other site, stage 4: Secondary | ICD-10-CM | POA: Diagnosis not present

## 2018-04-05 DIAGNOSIS — L89620 Pressure ulcer of left heel, unstageable: Secondary | ICD-10-CM | POA: Diagnosis not present

## 2018-04-05 DIAGNOSIS — L89892 Pressure ulcer of other site, stage 2: Secondary | ICD-10-CM | POA: Diagnosis not present

## 2018-04-08 DIAGNOSIS — L89610 Pressure ulcer of right heel, unstageable: Secondary | ICD-10-CM | POA: Diagnosis not present

## 2018-04-08 DIAGNOSIS — L89892 Pressure ulcer of other site, stage 2: Secondary | ICD-10-CM | POA: Diagnosis not present

## 2018-04-08 DIAGNOSIS — L89154 Pressure ulcer of sacral region, stage 4: Secondary | ICD-10-CM | POA: Diagnosis not present

## 2018-04-08 DIAGNOSIS — L89620 Pressure ulcer of left heel, unstageable: Secondary | ICD-10-CM | POA: Diagnosis not present

## 2018-04-08 DIAGNOSIS — R1312 Dysphagia, oropharyngeal phase: Secondary | ICD-10-CM | POA: Diagnosis not present

## 2018-04-08 DIAGNOSIS — I69391 Dysphagia following cerebral infarction: Secondary | ICD-10-CM | POA: Diagnosis not present

## 2018-04-10 DIAGNOSIS — L89892 Pressure ulcer of other site, stage 2: Secondary | ICD-10-CM | POA: Diagnosis not present

## 2018-04-10 DIAGNOSIS — L89610 Pressure ulcer of right heel, unstageable: Secondary | ICD-10-CM | POA: Diagnosis not present

## 2018-04-10 DIAGNOSIS — I69391 Dysphagia following cerebral infarction: Secondary | ICD-10-CM | POA: Diagnosis not present

## 2018-04-10 DIAGNOSIS — L89620 Pressure ulcer of left heel, unstageable: Secondary | ICD-10-CM | POA: Diagnosis not present

## 2018-04-10 DIAGNOSIS — L89154 Pressure ulcer of sacral region, stage 4: Secondary | ICD-10-CM | POA: Diagnosis not present

## 2018-04-10 DIAGNOSIS — R1312 Dysphagia, oropharyngeal phase: Secondary | ICD-10-CM | POA: Diagnosis not present

## 2018-04-12 DIAGNOSIS — I69391 Dysphagia following cerebral infarction: Secondary | ICD-10-CM | POA: Diagnosis not present

## 2018-04-12 DIAGNOSIS — R1312 Dysphagia, oropharyngeal phase: Secondary | ICD-10-CM | POA: Diagnosis not present

## 2018-04-12 DIAGNOSIS — L89620 Pressure ulcer of left heel, unstageable: Secondary | ICD-10-CM | POA: Diagnosis not present

## 2018-04-12 DIAGNOSIS — L89892 Pressure ulcer of other site, stage 2: Secondary | ICD-10-CM | POA: Diagnosis not present

## 2018-04-12 DIAGNOSIS — L89154 Pressure ulcer of sacral region, stage 4: Secondary | ICD-10-CM | POA: Diagnosis not present

## 2018-04-12 DIAGNOSIS — L89610 Pressure ulcer of right heel, unstageable: Secondary | ICD-10-CM | POA: Diagnosis not present

## 2018-04-13 DIAGNOSIS — R0602 Shortness of breath: Secondary | ICD-10-CM | POA: Diagnosis not present

## 2018-04-15 DIAGNOSIS — L89892 Pressure ulcer of other site, stage 2: Secondary | ICD-10-CM | POA: Diagnosis not present

## 2018-04-15 DIAGNOSIS — I69391 Dysphagia following cerebral infarction: Secondary | ICD-10-CM | POA: Diagnosis not present

## 2018-04-15 DIAGNOSIS — R1312 Dysphagia, oropharyngeal phase: Secondary | ICD-10-CM | POA: Diagnosis not present

## 2018-04-15 DIAGNOSIS — L89610 Pressure ulcer of right heel, unstageable: Secondary | ICD-10-CM | POA: Diagnosis not present

## 2018-04-15 DIAGNOSIS — L89154 Pressure ulcer of sacral region, stage 4: Secondary | ICD-10-CM | POA: Diagnosis not present

## 2018-04-15 DIAGNOSIS — L89620 Pressure ulcer of left heel, unstageable: Secondary | ICD-10-CM | POA: Diagnosis not present

## 2018-04-17 DIAGNOSIS — L89610 Pressure ulcer of right heel, unstageable: Secondary | ICD-10-CM | POA: Diagnosis not present

## 2018-04-17 DIAGNOSIS — L89154 Pressure ulcer of sacral region, stage 4: Secondary | ICD-10-CM | POA: Diagnosis not present

## 2018-04-17 DIAGNOSIS — G20 Parkinson's disease: Secondary | ICD-10-CM | POA: Diagnosis not present

## 2018-04-17 DIAGNOSIS — J9621 Acute and chronic respiratory failure with hypoxia: Secondary | ICD-10-CM | POA: Diagnosis not present

## 2018-04-17 DIAGNOSIS — G8250 Quadriplegia, unspecified: Secondary | ICD-10-CM | POA: Diagnosis not present

## 2018-04-17 DIAGNOSIS — L89892 Pressure ulcer of other site, stage 2: Secondary | ICD-10-CM | POA: Diagnosis not present

## 2018-04-18 DIAGNOSIS — J9621 Acute and chronic respiratory failure with hypoxia: Secondary | ICD-10-CM | POA: Diagnosis not present

## 2018-04-18 DIAGNOSIS — L89154 Pressure ulcer of sacral region, stage 4: Secondary | ICD-10-CM | POA: Diagnosis not present

## 2018-04-18 DIAGNOSIS — G20 Parkinson's disease: Secondary | ICD-10-CM | POA: Diagnosis not present

## 2018-04-18 DIAGNOSIS — G8250 Quadriplegia, unspecified: Secondary | ICD-10-CM | POA: Diagnosis not present

## 2018-04-18 DIAGNOSIS — L89892 Pressure ulcer of other site, stage 2: Secondary | ICD-10-CM | POA: Diagnosis not present

## 2018-04-18 DIAGNOSIS — L89610 Pressure ulcer of right heel, unstageable: Secondary | ICD-10-CM | POA: Diagnosis not present

## 2018-04-19 DIAGNOSIS — L89610 Pressure ulcer of right heel, unstageable: Secondary | ICD-10-CM | POA: Diagnosis not present

## 2018-04-19 DIAGNOSIS — G8250 Quadriplegia, unspecified: Secondary | ICD-10-CM | POA: Diagnosis not present

## 2018-04-19 DIAGNOSIS — L89892 Pressure ulcer of other site, stage 2: Secondary | ICD-10-CM | POA: Diagnosis not present

## 2018-04-19 DIAGNOSIS — J9621 Acute and chronic respiratory failure with hypoxia: Secondary | ICD-10-CM | POA: Diagnosis not present

## 2018-04-19 DIAGNOSIS — L89154 Pressure ulcer of sacral region, stage 4: Secondary | ICD-10-CM | POA: Diagnosis not present

## 2018-04-19 DIAGNOSIS — G20 Parkinson's disease: Secondary | ICD-10-CM | POA: Diagnosis not present

## 2018-04-22 DIAGNOSIS — J9621 Acute and chronic respiratory failure with hypoxia: Secondary | ICD-10-CM | POA: Diagnosis not present

## 2018-04-22 DIAGNOSIS — G8250 Quadriplegia, unspecified: Secondary | ICD-10-CM | POA: Diagnosis not present

## 2018-04-22 DIAGNOSIS — L89892 Pressure ulcer of other site, stage 2: Secondary | ICD-10-CM | POA: Diagnosis not present

## 2018-04-22 DIAGNOSIS — G20 Parkinson's disease: Secondary | ICD-10-CM | POA: Diagnosis not present

## 2018-04-22 DIAGNOSIS — L89154 Pressure ulcer of sacral region, stage 4: Secondary | ICD-10-CM | POA: Diagnosis not present

## 2018-04-22 DIAGNOSIS — L89610 Pressure ulcer of right heel, unstageable: Secondary | ICD-10-CM | POA: Diagnosis not present

## 2018-04-24 DIAGNOSIS — G20 Parkinson's disease: Secondary | ICD-10-CM | POA: Diagnosis not present

## 2018-04-24 DIAGNOSIS — L89892 Pressure ulcer of other site, stage 2: Secondary | ICD-10-CM | POA: Diagnosis not present

## 2018-04-24 DIAGNOSIS — J9621 Acute and chronic respiratory failure with hypoxia: Secondary | ICD-10-CM | POA: Diagnosis not present

## 2018-04-24 DIAGNOSIS — L89154 Pressure ulcer of sacral region, stage 4: Secondary | ICD-10-CM | POA: Diagnosis not present

## 2018-04-24 DIAGNOSIS — G8250 Quadriplegia, unspecified: Secondary | ICD-10-CM | POA: Diagnosis not present

## 2018-04-24 DIAGNOSIS — L89610 Pressure ulcer of right heel, unstageable: Secondary | ICD-10-CM | POA: Diagnosis not present

## 2018-04-25 ENCOUNTER — Ambulatory Visit (HOSPITAL_COMMUNITY)
Admission: RE | Admit: 2018-04-25 | Discharge: 2018-04-25 | Disposition: A | Discharge status: Home / Self Care | Payer: Medicare Other | Source: Ambulatory Visit | Attending: Acute Care | Admitting: Acute Care

## 2018-04-25 DIAGNOSIS — Z8673 Personal history of transient ischemic attack (TIA), and cerebral infarction without residual deficits: Secondary | ICD-10-CM | POA: Insufficient documentation

## 2018-04-25 DIAGNOSIS — Z930 Tracheostomy status: Secondary | ICD-10-CM | POA: Diagnosis not present

## 2018-04-25 NOTE — Progress Notes (Signed)
[facility]   Reason for visit:  Routine tracheostomy care HPI:  77 year old male patient with prior subarachnoid hemorrhage from ruptured aneurysms back in 2018 complicated further by stroke in December 2018 and then further complicated by subdural hematoma and intraventricular hemorrhage after IV TPA for stroke.  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  From a pulmonary standpoint he continues to require suctioning for airway clearance.  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  He is nonverbal.  Apparently just was treated for recent bronchitis/pneumonia. ROS  Unable Vital signs:  Heart rate 92 blood pressure 110/58 respirations 18 saturations 94% on room air Exam:  Debilitated 77 year old bedbound male, nonverbal, and does not react to verbal stimulus HEENT: Temporal wasting mucous membranes dry and caked.  Tracheostomy size 6 uncuffed was unremarkable.  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  He continues to require frequent suctioning, he remains debilitated, he is nonverbal, and remains bedbound.  His wife was inquiring about having ENT see him questioning about vocal cord paralysis.  I think this is a moot point unless we are considering decannulation and at this point I have no plans of that given his  debilitated status. Plan  Continue routine tracheostomy care Return visit 12 weeks Will await neurology input, I am doubtful Richard Mcpherson will improve.  Simonne Martinet ACNP-BC [facility] Pulmonary/Critical Care Pager # [phone] OR # [phone] if no answer     Visit time: 32 minutes.

## 2018-04-25 NOTE — Progress Notes (Signed)
Tracheostomy Procedure Note  Richard Mcpherson [id number] 17-Jul-1941  Pre Procedure Tracheostomy Information  Trach Brand: Shiley Size: 6 Style: Uncuffed Secured by: Velcro   Procedure: trach change    Post Procedure Tracheostomy Information  Trach Brand: Shiley Size: 6 Style: Uncuffed Secured by: Velcro   Post Procedure Evaluation:  ETCO2 positive color change from yellow to purple : Yes.   Vital signs:blood pressure 110/58, pulse 92, respirations 16 and pulse oximetry 94% Patients current condition: stable Complications: No apparent complications Trach site exam: clean, dry Wound care done: 4 x 4 gauze Patient did tolerate procedure well.   Education: None  Prescription needs: None   See back in trach clinic in 10 weeks

## 2018-04-26 DIAGNOSIS — G20 Parkinson's disease: Secondary | ICD-10-CM | POA: Diagnosis not present

## 2018-04-26 DIAGNOSIS — L89610 Pressure ulcer of right heel, unstageable: Secondary | ICD-10-CM | POA: Diagnosis not present

## 2018-04-26 DIAGNOSIS — J9621 Acute and chronic respiratory failure with hypoxia: Secondary | ICD-10-CM | POA: Diagnosis not present

## 2018-04-26 DIAGNOSIS — L89154 Pressure ulcer of sacral region, stage 4: Secondary | ICD-10-CM | POA: Diagnosis not present

## 2018-04-26 DIAGNOSIS — G8250 Quadriplegia, unspecified: Secondary | ICD-10-CM | POA: Diagnosis not present

## 2018-04-26 DIAGNOSIS — L89892 Pressure ulcer of other site, stage 2: Secondary | ICD-10-CM | POA: Diagnosis not present

## 2018-04-29 DIAGNOSIS — J9621 Acute and chronic respiratory failure with hypoxia: Secondary | ICD-10-CM | POA: Diagnosis not present

## 2018-04-29 DIAGNOSIS — L89892 Pressure ulcer of other site, stage 2: Secondary | ICD-10-CM | POA: Diagnosis not present

## 2018-04-29 DIAGNOSIS — G20 Parkinson's disease: Secondary | ICD-10-CM | POA: Diagnosis not present

## 2018-04-29 DIAGNOSIS — G8250 Quadriplegia, unspecified: Secondary | ICD-10-CM | POA: Diagnosis not present

## 2018-04-29 DIAGNOSIS — L89154 Pressure ulcer of sacral region, stage 4: Secondary | ICD-10-CM | POA: Diagnosis not present

## 2018-04-29 DIAGNOSIS — L89610 Pressure ulcer of right heel, unstageable: Secondary | ICD-10-CM | POA: Diagnosis not present

## 2018-04-30 DIAGNOSIS — L89892 Pressure ulcer of other site, stage 2: Secondary | ICD-10-CM | POA: Diagnosis not present

## 2018-04-30 DIAGNOSIS — J9621 Acute and chronic respiratory failure with hypoxia: Secondary | ICD-10-CM | POA: Diagnosis not present

## 2018-04-30 DIAGNOSIS — G8250 Quadriplegia, unspecified: Secondary | ICD-10-CM | POA: Diagnosis not present

## 2018-04-30 DIAGNOSIS — L89610 Pressure ulcer of right heel, unstageable: Secondary | ICD-10-CM | POA: Diagnosis not present

## 2018-04-30 DIAGNOSIS — L89154 Pressure ulcer of sacral region, stage 4: Secondary | ICD-10-CM | POA: Diagnosis not present

## 2018-04-30 DIAGNOSIS — G20 Parkinson's disease: Secondary | ICD-10-CM | POA: Diagnosis not present

## 2018-05-01 DIAGNOSIS — L89892 Pressure ulcer of other site, stage 2: Secondary | ICD-10-CM | POA: Diagnosis not present

## 2018-05-01 DIAGNOSIS — L89154 Pressure ulcer of sacral region, stage 4: Secondary | ICD-10-CM | POA: Diagnosis not present

## 2018-05-01 DIAGNOSIS — J9621 Acute and chronic respiratory failure with hypoxia: Secondary | ICD-10-CM | POA: Diagnosis not present

## 2018-05-01 DIAGNOSIS — L89610 Pressure ulcer of right heel, unstageable: Secondary | ICD-10-CM | POA: Diagnosis not present

## 2018-05-01 DIAGNOSIS — G20 Parkinson's disease: Secondary | ICD-10-CM | POA: Diagnosis not present

## 2018-05-01 DIAGNOSIS — G8250 Quadriplegia, unspecified: Secondary | ICD-10-CM | POA: Diagnosis not present

## 2018-05-06 ENCOUNTER — Encounter: Payer: Self-pay | Admitting: Neurology

## 2018-05-06 ENCOUNTER — Ambulatory Visit (INDEPENDENT_AMBULATORY_CARE_PROVIDER_SITE_OTHER): Payer: Medicare Other | Admitting: Neurology

## 2018-05-06 ENCOUNTER — Encounter

## 2018-05-06 VITALS — BP 137/74 | HR 86

## 2018-05-06 DIAGNOSIS — Z743 Need for continuous supervision: Secondary | ICD-10-CM | POA: Diagnosis not present

## 2018-05-06 DIAGNOSIS — G8250 Quadriplegia, unspecified: Secondary | ICD-10-CM | POA: Diagnosis not present

## 2018-05-06 DIAGNOSIS — R279 Unspecified lack of coordination: Secondary | ICD-10-CM | POA: Diagnosis not present

## 2018-05-06 DIAGNOSIS — I6389 Other cerebral infarction: Secondary | ICD-10-CM

## 2018-05-06 DIAGNOSIS — R404 Transient alteration of awareness: Secondary | ICD-10-CM | POA: Diagnosis not present

## 2018-05-06 DIAGNOSIS — G910 Communicating hydrocephalus: Secondary | ICD-10-CM | POA: Diagnosis not present

## 2018-05-06 DIAGNOSIS — R531 Weakness: Secondary | ICD-10-CM | POA: Diagnosis not present

## 2018-05-06 NOTE — Patient Instructions (Signed)
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  Unfortunately he has not improved and remains bedbound with contractures and tracheostomy and PEG tube.  Recommend home physical occupational therapy and 24-hour nursing care.  Check EMG nerve conduction study for critical illness polyneuropathy as well as CT scan of the head as patient apparently has worsened in recent months after leaving the nursing home.  Return for follow-up in the future only as necessary

## 2018-05-06 NOTE — Progress Notes (Signed)
[facility] [address] [place]. [place] [address] [phone]       OFFICE CONSULT NOTE  Richard Mcpherson Date of Birth:  1941-04-28 Medical Record Number:  [id number]   Referring MD:  Tenny Craw  Reason for Referral:  stroke  HPI: Richard Mcpherson is a 77 year Caucasian male seen today for initial office consultation visit following recent prolonged hospitalization.  He is accompanied by his wife who provides the history as patient is unable to speak.  I have reviewed his electronic medical records in care everywhere as well as imaging films in PACS.  He was admitted on 06/19/2017 with a fall at home initially to more hospital and then transferred to [facility].  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  His hospital course was also complicated by intraventricular hemorrhage and subdural hemorrhage.  He was also found to have paramedian left pontine infarct.  Patient required prolonged hospitalization in the ICU and subsequently required tracheostomy and PEG tube.  [facility] and then subsequently to [facility] in [place].  He has been home since 01/23/2018.  Patient's wife felt that patient was able to move his hands and feet initially with therapy but he has regressed and is not able to do so and has not developed contractures in all extremities.  He is still able to communicate his needs by nodding and moving his tongue and his eyelids.  His tracheostomy tube is now capped but is still in place.  He still has a PEG tube for feeding.  He is currently not getting much therapy but home therapy has been planned for 2 days a week.  Patient had a CT scan of the head done at [facility] on 02/04/2018 which I personally reviewed and shows a old left pontine paramedian infarct and there appears to be mild  communicating hydrocephalus out of proportion to degree of cortical atrophy noted.  Patient has also developed generalized wasting but the patient's wife feels that this is long-standing.  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  He had done well with that and had improved with only mild residual weakness.  ROS:   14 system review of systems is positive for weakness, quadriplegia, speech difficulty, stiffness, all other systems negative.  PMH:  Past Medical History:  Diagnosis Date  . Atrial premature beats   . Blood in stool   . BPH (benign prostatic hyperplasia)   . Cerebral aneurysm   . Coronary atherosclerosis of native coronary artery   . Depression   . Embolism (HCC)    History of right lower extremity distal embolism   . Gout   . Hyperglycemia   . Hyperlipidemia   . Hyperlipidemia, mixed   . Osteoarthritis   . Parkinson disease (HCC)   . Plantar fasciitis   . Pneumonia   . Pressure ulcer of sacral region   . Protein calorie malnutrition (HCC)   . Sepsis (HCC)   . Sinusitis   . Stroke (HCC)   . Subarachnoid hemorrhage (HCC)   . Tracheostomy dependent (HCC)   . Unspecified essential hypertension  Social History:  Social History   Socioeconomic History  . Marital status: Married    Spouse name: Not on file  . Number of children: Not on file  . Years of education: Not on file  . Highest education level: Not on file  Occupational History  . Occupation: retired  Engineer, production  . Financial resource strain: Not on file  . Food insecurity:    Worry: Not on file    Inability: Not on file  . Transportation needs:    Medical: Not on file    Non-medical: Not on file  Tobacco Use  . Smoking status: Former  Smoker    Packs/day: 1.00    Years: 40.00    Pack years: 40.00  . Smokeless tobacco: Current User    Types: Snuff  Substance and Sexual Activity  . Alcohol use: Yes    Comment: 1-2 beers every other day   . Drug use: No  . Sexual activity: Not on file  Lifestyle  . Physical activity:    Days per week: Not on file    Minutes per session: Not on file  . Stress: Not on file  Relationships  . Social connections:    Talks on phone: Not on file    Gets together: Not on file    Attends religious service: Not on file    Active member of club or organization: Not on file    Attends meetings of clubs or organizations: Not on file    Relationship status: Not on file  . Intimate partner violence:    Fear of current or ex partner: Not on file    Emotionally abused: Not on file    Physically abused: Not on file    Forced sexual activity: Not on file  Other Topics Concern  . Not on file  Social History Narrative  . Not on file    Medications:   Current Outpatient Medications on File Prior to Visit  Medication Sig Dispense Refill  . acetaminophen (TYLENOL) 500 MG tablet Place 500 mg into feeding tube every 6 (six) hours as needed for mild pain or moderate pain.    . Amino Acids-Protein Hydrolys (FEEDING SUPPLEMENT, PRO-STAT SUGAR FREE 64,) LIQD Take 30 mLs by mouth 2 (two) times daily with a meal. For wound healing.    Marland Kitchen antiseptic oral rinse (BIOTENE) LIQD 5 mLs by Mouth Rinse route as needed for dry mouth.    . Ascorbic Acid (VITAMIN C) 100 MG tablet Take 100 mg by mouth daily.    . chlorhexidine (PERIDEX) 0.12 % solution Use as directed 15 mLs in the mouth or throat 2 (two) times daily. For oral care of tongue and the roof of mouth    . Cholecalciferol (VITAMIN D3 PO) Take 2,000 Units by mouth.    . Cranberry 600 MG TABS Take by mouth.    Marland Kitchen ipratropium-albuterol (DUONEB) 0.5-2.5 (3) MG/3ML SOLN Take 3 mLs by nebulization every 6 (six) hours.    . magnesium oxide (MAG-OX) 400 MG  tablet Take 500 mg by mouth daily.     . Multiple Vitamin (MULTIVITAMIN WITH MINERALS) TABS tablet Place 1 tablet into feeding tube daily.    . niacin 500 MG tablet Take 500 mg by mouth at bedtime.    . vitamin B-12 (CYANOCOBALAMIN) 1000 MCG tablet Take 1,000 mcg by mouth daily.    . Zinc 50 MG TABS Take 1 tablet by mouth daily.     No current facility-administered medications on file  prior to visit.     Allergies:   Allergies  Allergen Reactions  . Codeine Nausea Only and Other (See Comments)  . Crestor [Rosuvastatin Calcium] Other (See Comments)    bodyache  . Lipitor [Atorvastatin] Other (See Comments)    Body aches  . Statins   . Allopurinol Rash    Physical Exam General: Frail cachectic severely malnourished looking Caucasian male lying on a stretcher.  He has a tracheostomy which is capped.  He also has a PEG tube and a Foley catheter., in no evident distress Head: head normocephalic and atraumatic.   Neck: supple with no carotid or supraclavicular bruits Cardiovascular: regular rate and rhythm, no murmurs Musculoskeletal: no deformity Skin:  no rash/petichiae Vascular:  Normal pulses all extremities  Neurologic Exam Mental Status: Awake and fully alert.  Follows midline commands well.  He has a tracheostomy tube.  Unable to speak Cranial Nerves: Fundoscopic exam reveals sharp disc margins. Pupils equal, briskly reactive to light. Extraocular movements full without nystagmus. Visual fields blinks to threat bilaterally.  Vision acuity cannot be tested reliably.  Hearing intact. Facial sensation intact. Face, tongue, palate moves normally and symmetrically.  Motor: Frail cachectic.  Generalized wasting.  Quadriplegia 0/5 strength.  Flexion contracture of the left hip and knee.  Bilateral foot drop.  Right leg contracture in the hip abduction and knee flexion position.   resistance to passive movements  Sensory.: appaers intact to touch , pinprick , position and vibratory  sensation.  Coordination: unable to test accurately bilaterally. Gait and Station:not done as patient quadrepegic Reflexes: 3+ and symmetric. Toes downgoing.   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  Unfortunately he has not improved and remains bedbound with contractures and tracheostomy and PEG following cervical contusion and fracture from a fall with brainstem and subcortical strokes as well as remote history of ruptured anterior communicating artery aneurysm status post endovascular coiling..  Recommend home physical occupational therapy and 24-hour nursing care.  Check EMG nerve conduction study for critical illness polyneuropathy as well as CT scan of the head as patient apparently has worsened in recent months after leaving the nursing home.  Greater than 50% time during this 55-minute consultation visit was spent on counseling and coordination of care about his spastic quadriplegia, cervical contusion, strokes remote subarachnoid hemorrhage and answering questions.  Return for follow-up in the future only as necessary Delia Heady,  MD  [facility] [address] [place], [place] [address]  Phone [phone] Fax [phone] Note: This document was prepared with digital dictation and possible smart phrase technology. Any transcriptional errors that result from this process are unintentional.

## 2018-05-07 ENCOUNTER — Telehealth: Payer: Self-pay | Admitting: Neurology

## 2018-05-07 DIAGNOSIS — G8250 Quadriplegia, unspecified: Secondary | ICD-10-CM | POA: Diagnosis not present

## 2018-05-07 DIAGNOSIS — G20 Parkinson's disease: Secondary | ICD-10-CM | POA: Diagnosis not present

## 2018-05-07 DIAGNOSIS — L89154 Pressure ulcer of sacral region, stage 4: Secondary | ICD-10-CM | POA: Diagnosis not present

## 2018-05-07 DIAGNOSIS — L89892 Pressure ulcer of other site, stage 2: Secondary | ICD-10-CM | POA: Diagnosis not present

## 2018-05-07 DIAGNOSIS — L89610 Pressure ulcer of right heel, unstageable: Secondary | ICD-10-CM | POA: Diagnosis not present

## 2018-05-07 DIAGNOSIS — J9621 Acute and chronic respiratory failure with hypoxia: Secondary | ICD-10-CM | POA: Diagnosis not present

## 2018-05-07 NOTE — Telephone Encounter (Signed)
medicare/mutual of [place] pt is scheduled at [facility] for 05/16/18 arrival time 2:10 pm pt wife is aware of time & day. I did fax order.

## 2018-05-08 DIAGNOSIS — L89892 Pressure ulcer of other site, stage 2: Secondary | ICD-10-CM | POA: Diagnosis not present

## 2018-05-08 DIAGNOSIS — G8250 Quadriplegia, unspecified: Secondary | ICD-10-CM | POA: Diagnosis not present

## 2018-05-08 DIAGNOSIS — L89154 Pressure ulcer of sacral region, stage 4: Secondary | ICD-10-CM | POA: Diagnosis not present

## 2018-05-08 DIAGNOSIS — G20 Parkinson's disease: Secondary | ICD-10-CM | POA: Diagnosis not present

## 2018-05-08 DIAGNOSIS — L89610 Pressure ulcer of right heel, unstageable: Secondary | ICD-10-CM | POA: Diagnosis not present

## 2018-05-08 DIAGNOSIS — J9621 Acute and chronic respiratory failure with hypoxia: Secondary | ICD-10-CM | POA: Diagnosis not present

## 2018-05-09 DIAGNOSIS — G20 Parkinson's disease: Secondary | ICD-10-CM | POA: Diagnosis not present

## 2018-05-09 DIAGNOSIS — L89892 Pressure ulcer of other site, stage 2: Secondary | ICD-10-CM | POA: Diagnosis not present

## 2018-05-09 DIAGNOSIS — J9621 Acute and chronic respiratory failure with hypoxia: Secondary | ICD-10-CM | POA: Diagnosis not present

## 2018-05-09 DIAGNOSIS — L89154 Pressure ulcer of sacral region, stage 4: Secondary | ICD-10-CM | POA: Diagnosis not present

## 2018-05-09 DIAGNOSIS — G8250 Quadriplegia, unspecified: Secondary | ICD-10-CM | POA: Diagnosis not present

## 2018-05-09 DIAGNOSIS — L89610 Pressure ulcer of right heel, unstageable: Secondary | ICD-10-CM | POA: Diagnosis not present

## 2018-05-10 DIAGNOSIS — L89154 Pressure ulcer of sacral region, stage 4: Secondary | ICD-10-CM | POA: Diagnosis not present

## 2018-05-10 DIAGNOSIS — L89610 Pressure ulcer of right heel, unstageable: Secondary | ICD-10-CM | POA: Diagnosis not present

## 2018-05-10 DIAGNOSIS — Z7401 Bed confinement status: Secondary | ICD-10-CM | POA: Diagnosis not present

## 2018-05-10 DIAGNOSIS — L89899 Pressure ulcer of other site, unspecified stage: Secondary | ICD-10-CM | POA: Diagnosis not present

## 2018-05-10 DIAGNOSIS — L89624 Pressure ulcer of left heel, stage 4: Secondary | ICD-10-CM | POA: Diagnosis not present

## 2018-05-10 DIAGNOSIS — R5381 Other malaise: Secondary | ICD-10-CM | POA: Diagnosis not present

## 2018-05-10 DIAGNOSIS — L89620 Pressure ulcer of left heel, unstageable: Secondary | ICD-10-CM | POA: Diagnosis not present

## 2018-05-10 DIAGNOSIS — R279 Unspecified lack of coordination: Secondary | ICD-10-CM | POA: Diagnosis not present

## 2018-05-10 DIAGNOSIS — L89311 Pressure ulcer of right buttock, stage 1: Secondary | ICD-10-CM | POA: Diagnosis not present

## 2018-05-14 DIAGNOSIS — Z931 Gastrostomy status: Secondary | ICD-10-CM | POA: Diagnosis not present

## 2018-05-14 DIAGNOSIS — L89154 Pressure ulcer of sacral region, stage 4: Secondary | ICD-10-CM | POA: Diagnosis not present

## 2018-05-14 DIAGNOSIS — N390 Urinary tract infection, site not specified: Secondary | ICD-10-CM | POA: Diagnosis present

## 2018-05-14 DIAGNOSIS — A419 Sepsis, unspecified organism: Secondary | ICD-10-CM | POA: Diagnosis not present

## 2018-05-14 DIAGNOSIS — A0472 Enterocolitis due to Clostridium difficile, not specified as recurrent: Secondary | ICD-10-CM | POA: Diagnosis present

## 2018-05-14 DIAGNOSIS — J9811 Atelectasis: Secondary | ICD-10-CM | POA: Diagnosis not present

## 2018-05-14 DIAGNOSIS — L8993 Pressure ulcer of unspecified site, stage 3: Secondary | ICD-10-CM | POA: Diagnosis not present

## 2018-05-14 DIAGNOSIS — L89153 Pressure ulcer of sacral region, stage 3: Secondary | ICD-10-CM | POA: Diagnosis present

## 2018-05-14 DIAGNOSIS — R509 Fever, unspecified: Secondary | ICD-10-CM | POA: Diagnosis not present

## 2018-05-14 DIAGNOSIS — B9620 Unspecified Escherichia coli [E. coli] as the cause of diseases classified elsewhere: Secondary | ICD-10-CM | POA: Diagnosis present

## 2018-05-14 DIAGNOSIS — Z23 Encounter for immunization: Secondary | ICD-10-CM | POA: Diagnosis not present

## 2018-05-14 DIAGNOSIS — L89620 Pressure ulcer of left heel, unstageable: Secondary | ICD-10-CM | POA: Diagnosis not present

## 2018-05-14 DIAGNOSIS — A0471 Enterocolitis due to Clostridium difficile, recurrent: Secondary | ICD-10-CM | POA: Diagnosis not present

## 2018-05-14 DIAGNOSIS — Z66 Do not resuscitate: Secondary | ICD-10-CM | POA: Diagnosis not present

## 2018-05-14 DIAGNOSIS — Z930 Tracheostomy status: Secondary | ICD-10-CM | POA: Diagnosis not present

## 2018-05-14 DIAGNOSIS — J9621 Acute and chronic respiratory failure with hypoxia: Secondary | ICD-10-CM | POA: Diagnosis not present

## 2018-05-14 DIAGNOSIS — L89624 Pressure ulcer of left heel, stage 4: Secondary | ICD-10-CM | POA: Diagnosis present

## 2018-05-14 DIAGNOSIS — R197 Diarrhea, unspecified: Secondary | ICD-10-CM | POA: Diagnosis not present

## 2018-05-14 DIAGNOSIS — Z8673 Personal history of transient ischemic attack (TIA), and cerebral infarction without residual deficits: Secondary | ICD-10-CM | POA: Diagnosis not present

## 2018-05-14 DIAGNOSIS — L89893 Pressure ulcer of other site, stage 3: Secondary | ICD-10-CM | POA: Diagnosis present

## 2018-05-14 DIAGNOSIS — R918 Other nonspecific abnormal finding of lung field: Secondary | ICD-10-CM | POA: Diagnosis not present

## 2018-05-14 DIAGNOSIS — J9611 Chronic respiratory failure with hypoxia: Secondary | ICD-10-CM | POA: Diagnosis present

## 2018-05-14 DIAGNOSIS — Z87891 Personal history of nicotine dependence: Secondary | ICD-10-CM | POA: Diagnosis not present

## 2018-05-14 DIAGNOSIS — L89892 Pressure ulcer of other site, stage 2: Secondary | ICD-10-CM | POA: Diagnosis not present

## 2018-05-14 DIAGNOSIS — S81809A Unspecified open wound, unspecified lower leg, initial encounter: Secondary | ICD-10-CM | POA: Diagnosis not present

## 2018-05-14 DIAGNOSIS — I10 Essential (primary) hypertension: Secondary | ICD-10-CM | POA: Diagnosis present

## 2018-05-14 DIAGNOSIS — F329 Major depressive disorder, single episode, unspecified: Secondary | ICD-10-CM | POA: Diagnosis present

## 2018-05-14 DIAGNOSIS — R404 Transient alteration of awareness: Secondary | ICD-10-CM | POA: Diagnosis not present

## 2018-05-14 DIAGNOSIS — I6789 Other cerebrovascular disease: Secondary | ICD-10-CM | POA: Diagnosis not present

## 2018-05-14 DIAGNOSIS — J189 Pneumonia, unspecified organism: Secondary | ICD-10-CM | POA: Diagnosis not present

## 2018-05-14 DIAGNOSIS — Z7901 Long term (current) use of anticoagulants: Secondary | ICD-10-CM | POA: Diagnosis not present

## 2018-05-14 DIAGNOSIS — Z7401 Bed confinement status: Secondary | ICD-10-CM | POA: Diagnosis not present

## 2018-05-14 DIAGNOSIS — L89614 Pressure ulcer of right heel, stage 4: Secondary | ICD-10-CM | POA: Diagnosis present

## 2018-05-14 DIAGNOSIS — B9562 Methicillin resistant Staphylococcus aureus infection as the cause of diseases classified elsewhere: Secondary | ICD-10-CM | POA: Diagnosis present

## 2018-05-14 DIAGNOSIS — L89610 Pressure ulcer of right heel, unstageable: Secondary | ICD-10-CM | POA: Diagnosis not present

## 2018-05-14 DIAGNOSIS — R000 Tachycardia, unspecified: Secondary | ICD-10-CM | POA: Diagnosis not present

## 2018-05-14 DIAGNOSIS — G20 Parkinson's disease: Secondary | ICD-10-CM | POA: Diagnosis present

## 2018-05-14 DIAGNOSIS — N400 Enlarged prostate without lower urinary tract symptoms: Secondary | ICD-10-CM | POA: Diagnosis present

## 2018-05-14 DIAGNOSIS — G8250 Quadriplegia, unspecified: Secondary | ICD-10-CM | POA: Diagnosis present

## 2018-05-14 DIAGNOSIS — Z79899 Other long term (current) drug therapy: Secondary | ICD-10-CM | POA: Diagnosis not present

## 2018-05-16 ENCOUNTER — Telehealth: Payer: Self-pay | Admitting: Neurology

## 2018-05-16 NOTE — Telephone Encounter (Signed)
CT Supervisor of [facility]  Dois Davenport [phone] has called to inform that pt did not make CT appointment today, she wanted to call and make Dr Pearlean Brownie aware

## 2018-05-16 NOTE — Telephone Encounter (Signed)
thanks

## 2018-05-21 ENCOUNTER — Encounter: Payer: Medicare Other | Admitting: Neurology

## 2018-06-27 ENCOUNTER — Ambulatory Visit (HOSPITAL_COMMUNITY): Payer: Medicare Other

## 2018-07-17 DEATH — deceased

## 2019-08-31 IMAGING — MR MR HEAD W/O CM
8 of 10 series · 35 of 48 positions shown · non-contrast
Comparison: CT studies 06/29/2017.  MRI 04/25/2017.

CLINICAL DATA: Ventilator support.  Multiple strokes.

EXAM:
MRI HEAD WITHOUT CONTRAST
TECHNIQUE: Multiplanar, multiecho pulse sequences of the brain and surrounding
structures were obtained without intravenous contrast.

[Series 3: DWI · axial · 3.0mm · 1.09mm/px · z∈[-49,+94]mm · 9 of 98 slices shown (1 of 4)]
[im 1/98]
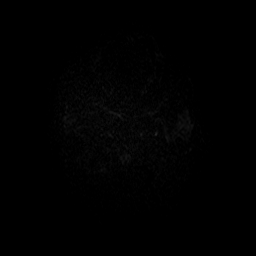
[im 13/98]
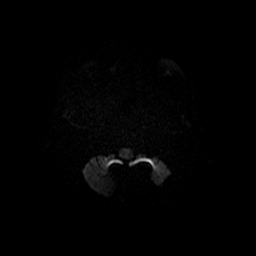
[im 25/98]
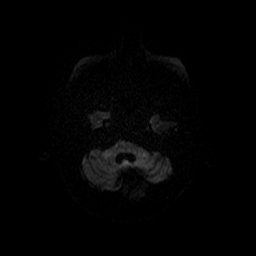
[im 37/98]
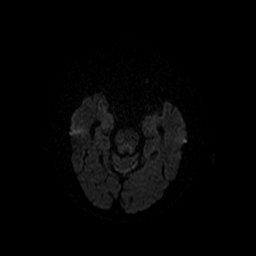
[im 49/98]
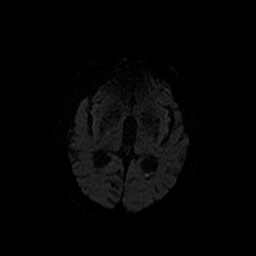
[im 61/98]
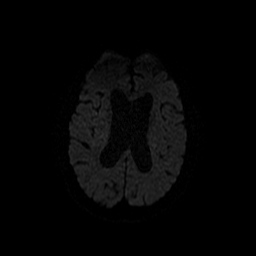
[im 73/98]
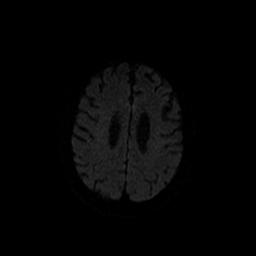
[im 85/98]
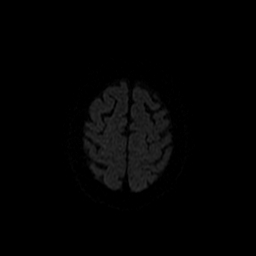
[im 98/98]
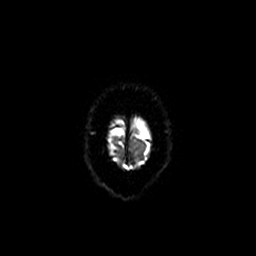

[Series 4: DWI · coronal · 5.0mm · 1.09mm/px · 7 of 64 slices shown (2 of 4)]
[im 1/64]
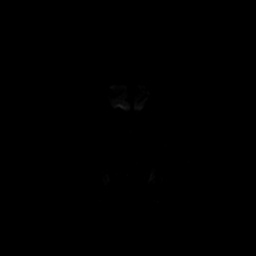
[im 11/64]
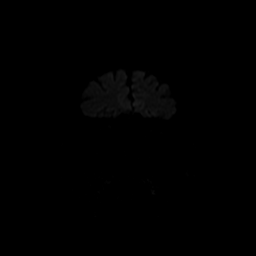
[im 22/64]
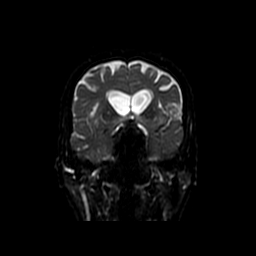
[im 32/64]
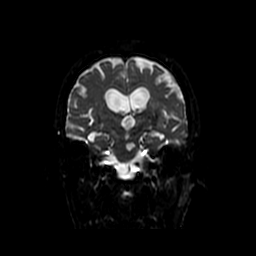
[im 43/64]
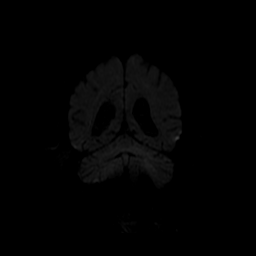
[im 53/64]
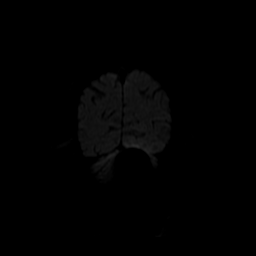
[im 64/64]
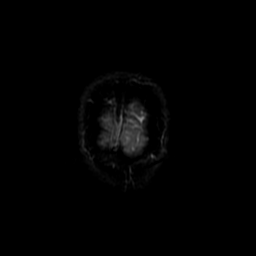

[Series 5: T1 · sagittal · 5.0mm · 0.47mm/px · 2 of 23 slices shown]
[im 1/23]
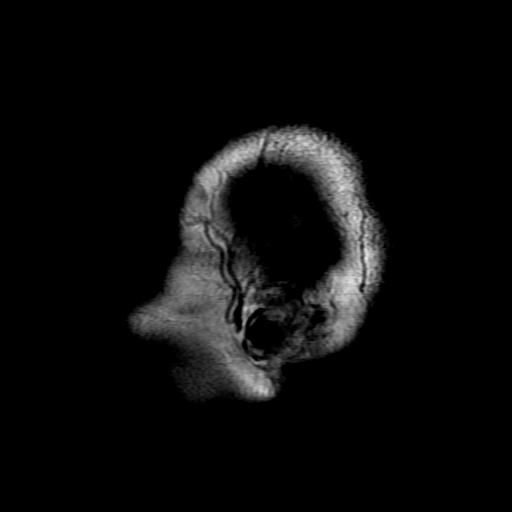
[im 23/23]
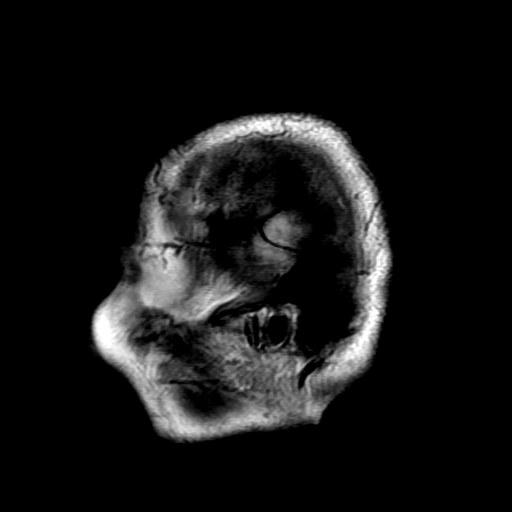

[Series 6: T2 · axial · 5.0mm · 0.43mm/px · z∈[-46,+98]mm · 3 of 25 slices shown (1 of 2)]
[im 1/25]
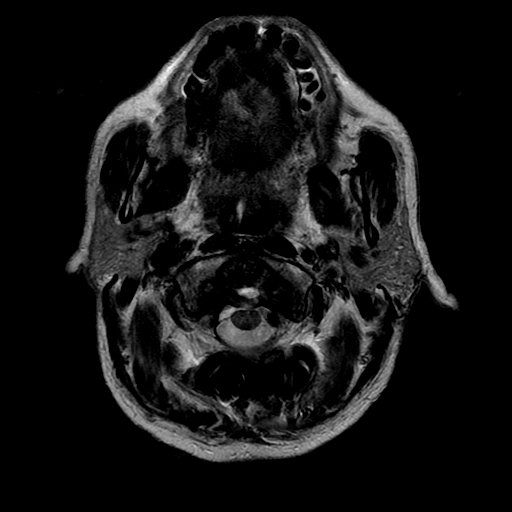
[im 13/25]
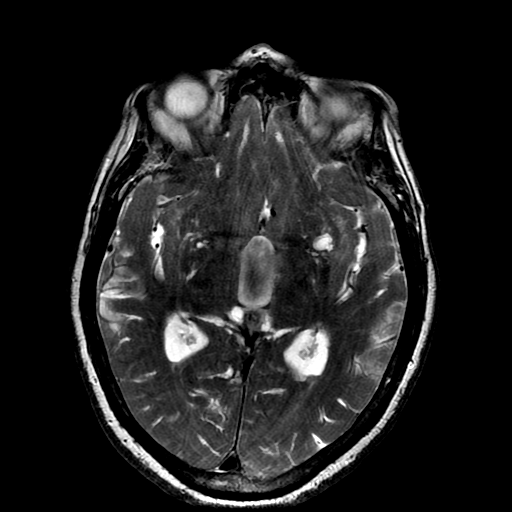
[im 25/25]
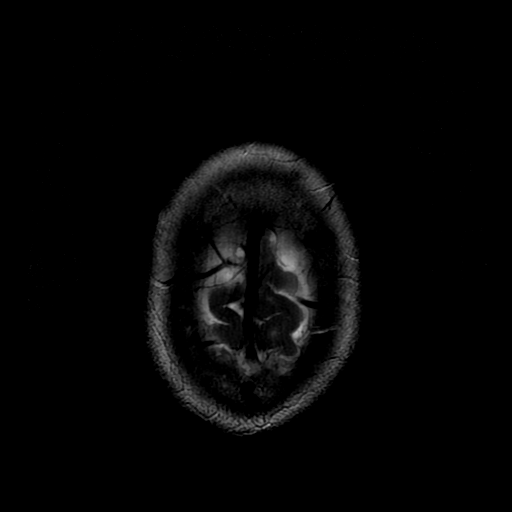

[Series 7: FLAIR · axial · 3.0mm · 0.43mm/px · z∈[-46,+98]mm · 3 of 25 slices shown]
[im 1/25]
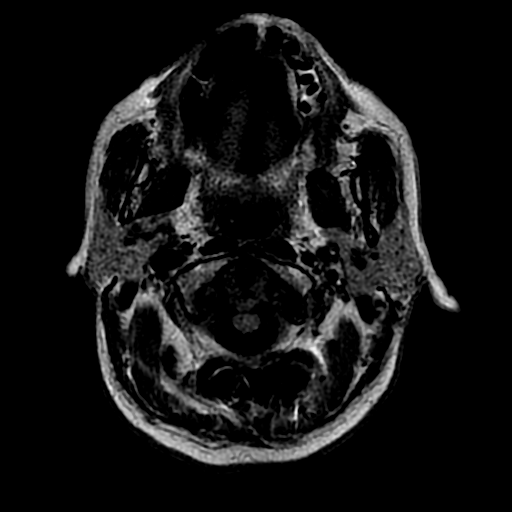
[im 13/25]
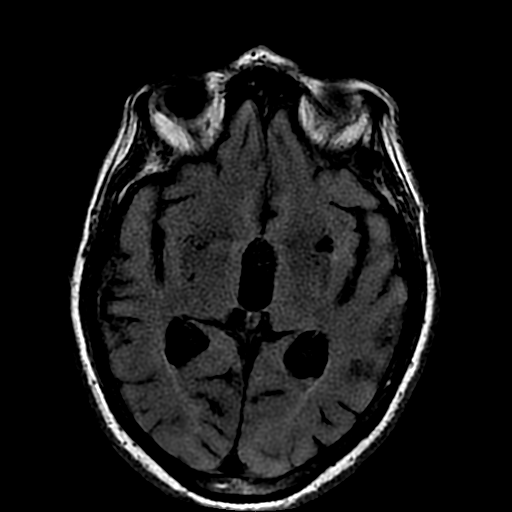
[im 25/25]
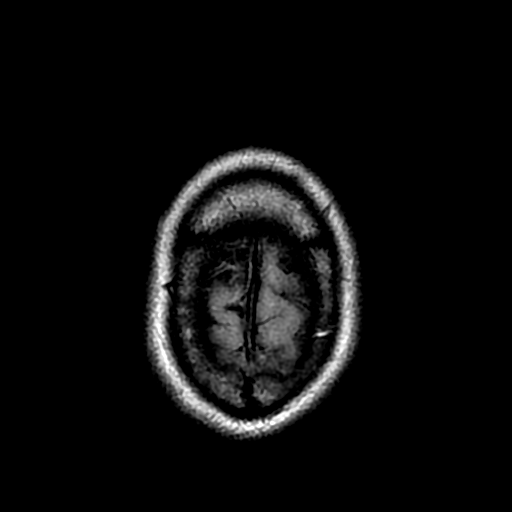

[Series 10: T2 · coronal · 5.0mm · 0.43mm/px · 3 of 27 slices shown (2 of 2)]
[im 1/27]
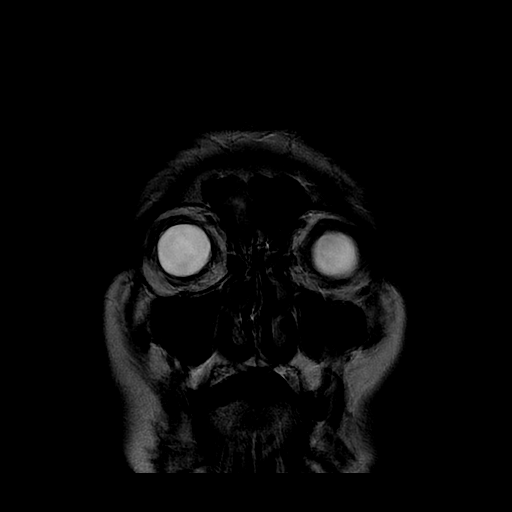
[im 14/27]
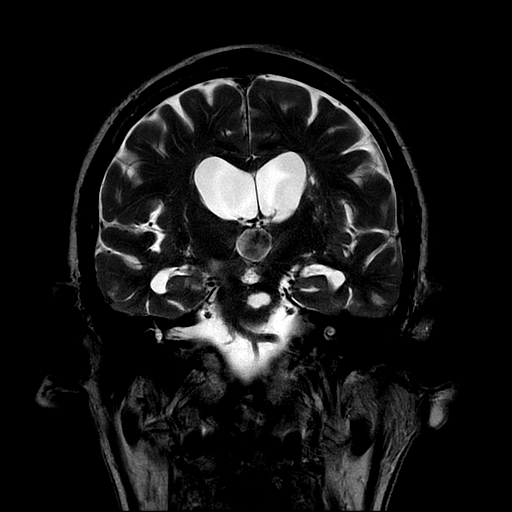
[im 27/27]
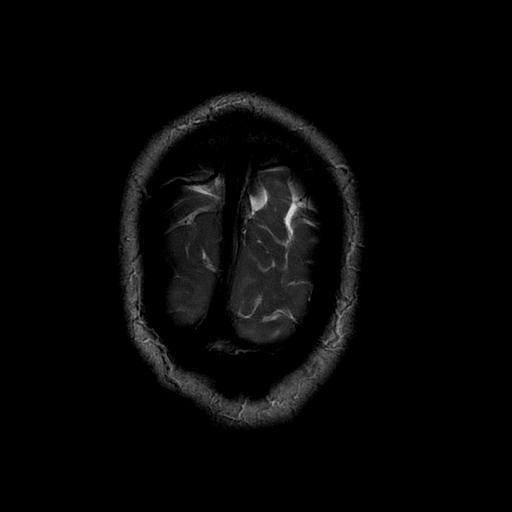

[Series 300: DWI · axial · 3.0mm · 1.09mm/px · z∈[-49,+94]mm · 5 of 49 slices shown (3 of 4)]
[im 1/49]
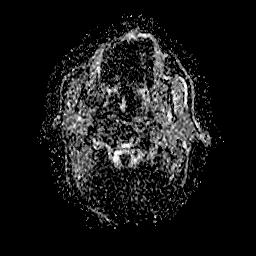
[im 13/49]
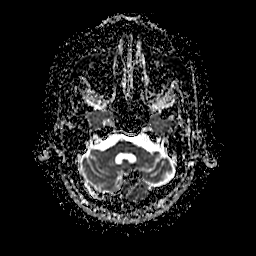
[im 25/49]
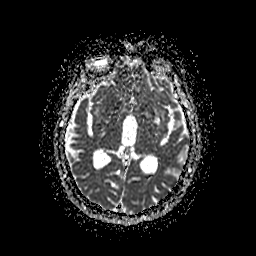
[im 37/49]
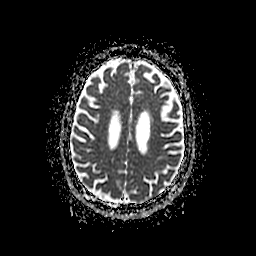
[im 49/49]
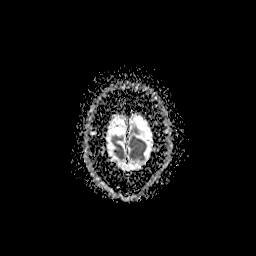

[Series 400: DWI · coronal · 5.0mm · 1.09mm/px · 3 of 32 slices shown (4 of 4)]
[im 1/32]
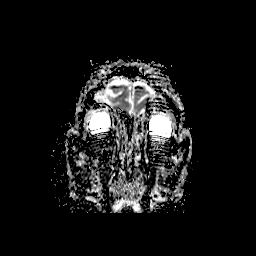
[im 16/32]
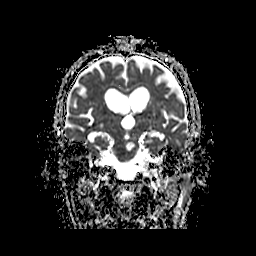
[im 32/32]
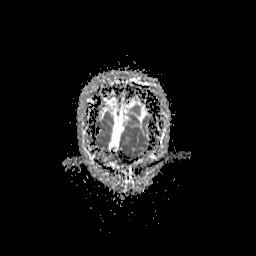

[35 of 48 positions shown; findings below may reference images not displayed]

FINDINGS: Brain: Diffusion imaging shows a 4 mm acute infarction in the
cerebral peduncle on the right. No other acute infarction. There are
old small vessel cerebellar infarctions. There is old infarction
within the left para median pons. There chronic small-vessel
ischemic changes of the hemispheric white matter. No large vessel
territory infarction. Chronic ventriculomegaly without change.
Previously coiled circle of Willis aneurysm. I suspect that there is
a thin subdural hematoma in the posterior fossa on the right. This
could be artifact, given pronounced susceptibility artifact in the
region, but I think is probably real. Maximal thickness 3 mm. No
mass effect. The patient has some layering blood products in the
occipital horns of both lateral ventricles.

Vascular: Major vessels at the base of the brain show flow.

Skull and upper cervical spine: It appears that the patient has had
extensive cervical decompression and fusion, possibly with extension
to the occiput.

Sinuses/Orbits: Clear/normal

Other: None
IMPRESSION: Acute 4 mm infarction right cerebral peduncle.

Thin subdural hematoma posterior fossa on the right, maximal
thickness 3 mm.

Small amount of blood dependent in the occipital horns of both
lateral ventricles. Question if these hemorrhagic findings relate to
the recent craniocervical surgical procedure.

Chronic ischemic changes elsewhere throughout the brain. Chronic
ventriculomegaly.
